# Patient Record
Sex: Male | Born: 1940 | Race: White | Hispanic: No | Marital: Married | Smoking: Never smoker
Health system: Southern US, Community
[De-identification: ages and names within clinical notes are randomized; demographics above are authoritative.]

## PROBLEM LIST (undated history)

## (undated) DIAGNOSIS — K5730 Diverticulosis of large intestine without perforation or abscess without bleeding: Secondary | ICD-10-CM

## (undated) DIAGNOSIS — K219 Gastro-esophageal reflux disease without esophagitis: Secondary | ICD-10-CM

## (undated) DIAGNOSIS — N200 Calculus of kidney: Secondary | ICD-10-CM

## (undated) DIAGNOSIS — R195 Other fecal abnormalities: Secondary | ICD-10-CM

## (undated) DIAGNOSIS — E785 Hyperlipidemia, unspecified: Secondary | ICD-10-CM

## (undated) HISTORY — DX: Hyperlipidemia, unspecified: E78.5

## (undated) HISTORY — DX: Calculus of kidney: N20.0

## (undated) HISTORY — DX: Other fecal abnormalities: R19.5

## (undated) HISTORY — DX: Diverticulosis of large intestine without perforation or abscess without bleeding: K57.30

## (undated) HISTORY — PX: APPENDECTOMY: SHX54

---

## 1962-05-25 HISTORY — PX: APPENDECTOMY: SHX54

## 1982-05-25 HISTORY — PX: HEMORRHOID SURGERY: SHX153

## 1998-10-16 ENCOUNTER — Emergency Department (HOSPITAL_COMMUNITY): Admission: EM | Admit: 1998-10-16 | Discharge: 1998-10-17 | Payer: Self-pay | Admitting: Emergency Medicine

## 1998-10-18 ENCOUNTER — Ambulatory Visit (HOSPITAL_COMMUNITY): Admission: RE | Admit: 1998-10-18 | Discharge: 1998-10-18 | Payer: Self-pay | Admitting: Emergency Medicine

## 1998-10-18 ENCOUNTER — Encounter: Payer: Self-pay | Admitting: Emergency Medicine

## 2000-03-24 ENCOUNTER — Encounter: Payer: Self-pay | Admitting: *Deleted

## 2000-03-24 ENCOUNTER — Encounter: Admission: RE | Admit: 2000-03-24 | Discharge: 2000-03-24 | Payer: Self-pay | Admitting: *Deleted

## 2001-08-23 ENCOUNTER — Ambulatory Visit (HOSPITAL_COMMUNITY): Admission: RE | Admit: 2001-08-23 | Discharge: 2001-08-23 | Payer: Self-pay | Admitting: Gastroenterology

## 2001-08-23 ENCOUNTER — Encounter (INDEPENDENT_AMBULATORY_CARE_PROVIDER_SITE_OTHER): Payer: Self-pay | Admitting: *Deleted

## 2004-03-26 ENCOUNTER — Ambulatory Visit (HOSPITAL_COMMUNITY): Admission: RE | Admit: 2004-03-26 | Discharge: 2004-03-26 | Payer: Self-pay | Admitting: *Deleted

## 2007-06-28 ENCOUNTER — Emergency Department (HOSPITAL_COMMUNITY): Admission: EM | Admit: 2007-06-28 | Discharge: 2007-06-28 | Payer: Self-pay | Admitting: Emergency Medicine

## 2008-12-04 ENCOUNTER — Encounter (INDEPENDENT_AMBULATORY_CARE_PROVIDER_SITE_OTHER): Payer: Self-pay | Admitting: *Deleted

## 2008-12-04 ENCOUNTER — Encounter: Admission: RE | Admit: 2008-12-04 | Discharge: 2008-12-04 | Payer: Self-pay | Admitting: Internal Medicine

## 2009-02-04 ENCOUNTER — Ambulatory Visit: Payer: Self-pay | Admitting: Gastroenterology

## 2009-02-04 DIAGNOSIS — K5730 Diverticulosis of large intestine without perforation or abscess without bleeding: Secondary | ICD-10-CM | POA: Insufficient documentation

## 2009-02-04 DIAGNOSIS — R195 Other fecal abnormalities: Secondary | ICD-10-CM

## 2009-02-14 ENCOUNTER — Ambulatory Visit: Payer: Self-pay | Admitting: Gastroenterology

## 2009-02-25 ENCOUNTER — Ambulatory Visit: Payer: Self-pay | Admitting: Gastroenterology

## 2009-02-25 LAB — CONVERTED CEMR LAB
OCCULT 1: NEGATIVE
OCCULT 2: NEGATIVE
OCCULT 5: NEGATIVE

## 2009-02-27 ENCOUNTER — Encounter: Payer: Self-pay | Admitting: Gastroenterology

## 2009-04-10 ENCOUNTER — Encounter: Admission: RE | Admit: 2009-04-10 | Discharge: 2009-04-10 | Payer: Self-pay | Admitting: Emergency Medicine

## 2010-10-10 NOTE — Procedures (Signed)
[facility]  Patient:    Evan Lopez, Evan Lopez Visit Number: [id number] MRN: [id number]          Service Type: Attending:  Everardo All. Madilyn Fireman, M.D. Dictated by:   Everardo All Madilyn Fireman, M.D. Proc. Date: 08/23/01   CC:         Caryn Bee L. Little, M.D.   Procedure Report  PROCEDURE:  Colonoscopy.  INDICATION FOR PROCEDURE:  Screening colonoscopy in a 70 year old patient.  DESCRIPTION OF PROCEDURE:  The patient was placed in the left lateral decubitus position and placed on the pulse monitor with continuous low-flow oxygen delivered by nasal cannula.  He was sedated with 85 mg of IV Demerol and 7.5 mg of IV Versed.  The Olympus video colonoscope was inserted into the rectum and advanced to the cecum, confirmed by transillumination at McBurneys point and visualization of the ileocecal valve and appendiceal orifice.  The prep was excellent.  The cecum appeared normal.  In the ascending colon, there were seen 2-3 diverticula and no other abnormalities.  The transverse, descending and sigmoid colon appeared normal with no further diverticula, polyps, masses, abnormalities.  The rectum likewise appeared normal, and retroflex view of the anus revealed no obvious internal hemorrhoids.  The colonoscope was then withdrawn, and the patient returned to the recovery room in stable condition.  He tolerated the procedure well, and there were no immediate complications.  IMPRESSION: 1. Scattered right-sided diverticula. 2. Otherwise normal colonoscopy.  PLAN:  Flexible sigmoidoscopy in five years for future screening. Dictated by:   Everardo All Madilyn Fireman, M.D. Attending:  Everardo All. Madilyn Fireman, M.D. DD:  08/23/01 TD:  08/24/01 Job: [id number]

## 2010-12-08 ENCOUNTER — Encounter (HOSPITAL_COMMUNITY): Payer: Self-pay

## 2010-12-08 ENCOUNTER — Emergency Department (HOSPITAL_COMMUNITY): Payer: Managed Care, Other (non HMO)

## 2010-12-08 ENCOUNTER — Emergency Department (HOSPITAL_COMMUNITY)
Admission: EM | Admit: 2010-12-08 | Discharge: 2010-12-08 | Disposition: A | Discharge status: Home / Self Care | Payer: Managed Care, Other (non HMO) | Attending: Emergency Medicine | Admitting: Emergency Medicine

## 2010-12-08 DIAGNOSIS — R61 Generalized hyperhidrosis: Secondary | ICD-10-CM | POA: Insufficient documentation

## 2010-12-08 DIAGNOSIS — K219 Gastro-esophageal reflux disease without esophagitis: Secondary | ICD-10-CM | POA: Insufficient documentation

## 2010-12-08 DIAGNOSIS — R109 Unspecified abdominal pain: Secondary | ICD-10-CM | POA: Insufficient documentation

## 2010-12-08 DIAGNOSIS — R197 Diarrhea, unspecified: Secondary | ICD-10-CM | POA: Insufficient documentation

## 2010-12-08 DIAGNOSIS — K5732 Diverticulitis of large intestine without perforation or abscess without bleeding: Secondary | ICD-10-CM | POA: Insufficient documentation

## 2010-12-08 HISTORY — DX: Gastro-esophageal reflux disease without esophagitis: K21.9

## 2010-12-08 LAB — COMPREHENSIVE METABOLIC PANEL
ALT: 13 U/L (ref 0–53)
BUN: 11 mg/dL (ref 6–23)
CO2: 28 mEq/L (ref 19–32)
Calcium: 9.1 mg/dL (ref 8.4–10.5)
GFR calc Af Amer: >60 mL/min (ref >60)
GFR calc non Af Amer: >60 mL/min (ref >60)
Glucose, Bld: 98 mg/dL (ref 70–99)
Sodium: 141 mEq/L (ref 135–145)
Total Protein: 7 g/dL (ref 6.0–8.3)

## 2010-12-08 LAB — CBC
HCT: 38.9 % — ABNORMAL LOW (ref 39.0–52.0)
Hemoglobin: 14 g/dL (ref 13.0–17.0)
MCH: 30 pg (ref 26.0–34.0)
MCHC: 36 g/dL (ref 30.0–36.0)
MCV: 83.3 fL (ref 78.0–100.0)
RBC: 4.67 MIL/uL (ref 4.22–5.81)

## 2010-12-08 LAB — DIFFERENTIAL
Basophils Relative: 0 % (ref 0–1)
Lymphocytes Relative: 11 % — ABNORMAL LOW (ref 12–46)
Lymphs Abs: 1.2 10*3/uL (ref 0.7–4.0)
Monocytes Absolute: 0.6 10*3/uL (ref 0.1–1.0)
Monocytes Relative: 6 % (ref 3–12)
Neutro Abs: 8.4 10*3/uL — ABNORMAL HIGH (ref 1.7–7.7)
Neutrophils Relative %: 82 % — ABNORMAL HIGH (ref 43–77)

## 2010-12-08 LAB — URINALYSIS, ROUTINE W REFLEX MICROSCOPIC
Bilirubin Urine: NEGATIVE
Glucose, UA: NEGATIVE mg/dL
Hgb urine dipstick: NEGATIVE
Specific Gravity, Urine: 1.02 (ref 1.005–1.030)
Urobilinogen, UA: 1 mg/dL (ref 0.0–1.0)

## 2010-12-08 LAB — LIPASE, BLOOD: Lipase: 18 U/L (ref 11–59)

## 2010-12-08 MED ORDER — IOHEXOL 300 MG/ML  SOLN
100.0000 mL | Freq: Once | INTRAMUSCULAR | Status: AC | PRN
  Administered 2010-12-08: 100 mL via INTRAVENOUS

## 2011-02-13 LAB — HEPATIC FUNCTION PANEL
ALT: 20
AST: 21
Albumin: 3.7
Alkaline Phosphatase: 82
Bilirubin, Direct: 0.1
Indirect Bilirubin: 0.8
Total Bilirubin: 0.9
Total Protein: 6.5

## 2011-02-13 LAB — DIFFERENTIAL
Basophils Absolute: 0.1
Basophils Relative: 1
Lymphocytes Relative: 32
Monocytes Relative: 8
Neutro Abs: 3.1
Neutrophils Relative %: 56

## 2011-02-13 LAB — POCT CARDIAC MARKERS
CKMB, poc: 2.2
Myoglobin, poc: 84.6
Operator id: 1415
Troponin i, poc: <0.05

## 2011-02-13 LAB — CBC
Hemoglobin: 14.1
MCHC: 35.1
RBC: 4.74
RDW: 13.2

## 2011-02-13 LAB — BASIC METABOLIC PANEL WITH GFR
BUN: 8
CO2: 27
Calcium: 9.2
Creatinine, Ser: 0.91
GFR calc Af Amer: >60
Glucose, Bld: 95

## 2011-02-13 LAB — LIPASE, BLOOD: Lipase: 19

## 2011-02-13 LAB — BASIC METABOLIC PANEL
Chloride: 108
GFR calc non Af Amer: >60
Potassium: 3.8
Sodium: 141

## 2011-06-24 ENCOUNTER — Ambulatory Visit (INDEPENDENT_AMBULATORY_CARE_PROVIDER_SITE_OTHER): Payer: Medicare Other | Admitting: Family Medicine

## 2011-06-24 ENCOUNTER — Ambulatory Visit: Payer: Medicare Other

## 2011-06-24 VITALS — BP 138/82 | HR 68 | Temp 98.0°F | Resp 18 | Ht 66.0 in | Wt 155.0 lb

## 2011-06-24 DIAGNOSIS — E785 Hyperlipidemia, unspecified: Secondary | ICD-10-CM

## 2011-06-24 DIAGNOSIS — K219 Gastro-esophageal reflux disease without esophagitis: Secondary | ICD-10-CM

## 2011-06-24 DIAGNOSIS — Z23 Encounter for immunization: Secondary | ICD-10-CM

## 2011-06-24 DIAGNOSIS — N200 Calculus of kidney: Secondary | ICD-10-CM | POA: Insufficient documentation

## 2011-06-24 DIAGNOSIS — M25529 Pain in unspecified elbow: Secondary | ICD-10-CM

## 2011-06-24 MED ORDER — METHYLPREDNISOLONE ACETATE 80 MG/ML IJ SUSP
80.0000 mg | Freq: Once | INTRAMUSCULAR | Status: AC
  Administered 2011-06-24: 80 mg via INTRA_ARTICULAR

## 2011-06-24 MED ORDER — INFLUENZA VIRUS VACC SPLIT PF IM SUSP: 0.5000 mL | INTRAMUSCULAR | Status: DC

## 2011-06-24 NOTE — Patient Instructions (Signed)
Tennis Elbow Your caregiver has diagnosed you with a condition often referred to as "tennis elbow." This results from small tears or soreness (inflammation) at the start (origin) of the extensor muscles of the forearm. Although the condition is often called tennis or golfer's elbow, it is caused by any repetitive action performed by your elbow. HOME CARE INSTRUCTIONS  If the condition has been short lived, rest may be the only treatment required. Using your opposite hand or arm to perform the task may help. Even changing your grip may help rest the extremity. These may even prevent the condition from recurring.   Longer standing problems, however, will often be relieved faster by:   Using anti-inflammatory agents.   Applying ice packs for 30 minutes at the end of the working day, at bed time, or when activities are finished.   Your caregiver may also have you wear a splint or sling. This will allow the inflamed tendon to heal.  At times, steroid injections aided with a local anesthetic will be required along with splinting for 1 to 2 weeks. Two to three steroid injections will often solve the problem. In some long standing cases, the inflamed tendon does not respond to conservative (non-surgical) therapy. Then surgery may be required to repair it. MAKE SURE YOU:   Understand these instructions.   Will watch your condition.   Will get help right away if you are not doing well or get worse.  Document Released: 05/11/2005 Document Revised: 01/21/2011 Document Reviewed: 12/28/2007 [employer] Patient Information 2012 [employer]. 

## 2011-06-24 NOTE — Progress Notes (Signed)
Patient ID: Evan Lopez, male   DOB: [birth year], 71 y.o.   MRN: [id number] This is a 71 yo man with a 3-4 week history of right elbow pain, made worse by clenching fist.  Awoke this morning and it was worse than ever.  Worse pain with supination.  NKI.  History of tennis elbow.  No new activities.  Patient able to move elbow with FROM.  No sign of skin or bony abnormality.  X-ray:  Bone cyst lateral epicondyle  Injection:  1 cc depomedrol and 1 cc sensorcaine injected at lateral epicondyle tender point after sterile prep.  No complications.  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  Plan:  Patient to call if pain not completely relieved in 48 hours

## 2011-11-25 ENCOUNTER — Ambulatory Visit (INDEPENDENT_AMBULATORY_CARE_PROVIDER_SITE_OTHER): Payer: Medicare Other | Admitting: Family Medicine

## 2011-11-25 VITALS — BP 122/78 | HR 75 | Temp 98.2°F | Resp 14 | Ht 66.0 in | Wt 157.2 lb

## 2011-11-25 DIAGNOSIS — M7050 Other bursitis of knee, unspecified knee: Secondary | ICD-10-CM

## 2011-11-25 DIAGNOSIS — IMO0002 Reserved for concepts with insufficient information to code with codable children: Secondary | ICD-10-CM

## 2011-11-25 DIAGNOSIS — M7710 Lateral epicondylitis, unspecified elbow: Secondary | ICD-10-CM

## 2011-11-25 MED ORDER — DICLOFENAC SODIUM 75 MG PO TBEC: 75.0000 mg | DELAYED_RELEASE_TABLET | Freq: Two times a day (BID) | ORAL | Status: DC

## 2011-11-25 NOTE — Progress Notes (Signed)
Subjective: 71 year old man who has a history of being here in January with right elbow pain. He received a shot of cortisone into that elbow and it did much better for a couple of months. It recurred in approximately March and he has been living with the discomfort since then. More recently has been having pain in the medial aspect of his right knee also. He does some yard work, but no major repetitive activity. He is retired.  Objective: Right elbow he is tender in the lateral epicondyle. No obvious redness or irritation. The right knee also has tenderness medially. It feels like the tenderness is just posterior to the pes anserine bursitis region. Is no specific injury of this. It hurts him worse if he is on his feet a lot. No effusion is detected.  Assessment: Right epicondylitis, medial elbow Right  pes anserine bursitis  History of GERD  Plan: Diclofenac 75 mg twice a day for 2 weeks. If he is not doing much better at the end of that time we will have to consider reinjecting in/or referring to orthopedist

## 2011-11-25 NOTE — Patient Instructions (Addendum)
Take the diclofenac twice daily with food for the inflammation in the elbow and the knee.  Apply ice to these areas 3 or 4 times daily  If the medicine upset her stomach quit taking it and come back in sooner. Continue your omeprazole.     Lateral Epicondylitis (Tennis Elbow) with Rehab Lateral epicondylitis involves inflammation and pain around the outer portion of the elbow. The pain is caused by inflammation of the tendons in the forearm that bring back (extend) the wrist. Lateral epicondylittis is also called tennis elbow, because it is very common in tennis players. However, it may occur in any individual who extends the wrist repetitively. If lateral epicondylitis is left untreated, it may become a chronic problem. SYMPTOMS   Pain, tenderness, and inflammation on the outer (lateral) side of the elbow.   Pain or weakness with gripping activities.   Pain that increases with wrist twisting motions (playing tennis, using a screwdriver, opening a door or a jar).   Pain with lifting objects, including a coffee cup.  CAUSES  Lateral epicondylitis is caused by inflammation of the tendons that extend the wrist. Causes of injury may include:  Repetitive stress and strain on the muscles and tendons that extend the wrist.   Sudden change in activity level or intensity.   Incorrect grip in racquet sports.   Incorrect grip size of racquet (often too large).   Incorrect hitting position or technique (usually backhand, leading with the elbow).   Using a racket that is too heavy.  RISK INCREASES WITH:  Sports or occupations that require repetitive and/or strenuous forearm and wrist movements (tennis, squash, racquetball, carpentry).   Poor wrist and forearm strength and flexibility.   Failure to warm up properly before activity.   Resuming activity before healing, rehabilitation, and conditioning are complete.  PREVENTION   Warm up and stretch properly before activity.   Maintain  physical fitness:   Strength, flexibility, and endurance.   Cardiovascular fitness.   Wear and use properly fitted equipment.   Learn and use proper technique and have a coach correct improper technique.   Wear a tennis elbow (counterforce) brace.  PROGNOSIS  The course of this condition depends on the degree of the injury. If treated properly, acute cases (symptoms lasting less than 4 weeks) are often resolved in 2 to 6 weeks. Chronic (longer lasting cases) often resolve in 3 to 6 months, but may require physical therapy. RELATED COMPLICATIONS   Frequently recurring symptoms, resulting in a chronic problem. Properly treating the problem the first time decreases frequency of recurrence.   Chronic inflammation, scarring tendon degeneration, and partial tendon tear, requiring surgery.   Delayed healing or resolution of symptoms.  TREATMENT  Treatment first involves the use of ice and medicine, to reduce pain and inflammation. Strengthening and stretching exercises may help reduce discomfort, if performed regularly. These exercises may be performed at home, if the condition is an acute injury. Chronic cases may require a referral to a physical therapist for evaluation and treatment. Your caregiver may advise a corticosteroid injection, to help reduce inflammation. Rarely, surgery is needed. MEDICATION  If pain medicine is needed, nonsteroidal anti-inflammatory medicines (aspirin and ibuprofen), or other minor pain relievers (acetaminophen), are often advised.   Do not take pain medicine for 7 days before surgery.   Prescription pain relievers may be given, if your caregiver thinks they are needed. Use only as directed and only as much as you need.   Corticosteroid injections may be recommended.  These injections should be reserved only for the most severe cases, because they can only be given a certain number of times.  HEAT AND COLD  Cold treatment (icing) should be applied for 10 to 15  minutes every 2 to 3 hours for inflammation and pain, and immediately after activity that aggravates your symptoms. Use ice packs or an ice massage.   Heat treatment may be used before performing stretching and strengthening activities prescribed by your caregiver, physical therapist, or athletic trainer. Use a heat pack or a warm water soak.  SEEK MEDICAL CARE IF: Symptoms get worse or do not improve in 2 weeks, despite treatment. EXERCISES  RANGE OF MOTION (ROM) AND STRETCHING EXERCISES - Epicondylitis, Lateral (Tennis Elbow) These exercises may help you when beginning to rehabilitate your injury. Your symptoms may go away with or without further involvement from your physician, physical therapist or athletic trainer. While completing these exercises, remember:   Restoring tissue flexibility helps normal motion to return to the joints. This allows healthier, less painful movement and activity.   An effective stretch should be held for at least 30 seconds.   A stretch should never be painful. You should only feel a gentle lengthening or release in the stretched tissue.  RANGE OF MOTION - Wrist Flexion, Active-Assisted  Extend your right / left elbow with your fingers pointing down.*   Gently pull the back of your hand towards you, until you feel a gentle stretch on the top of your forearm.   Hold this position for __________ seconds.  Repeat __________ times. Complete this exercise __________ times per day.  *If directed by your physician, physical therapist or athletic trainer, complete this stretch with your elbow bent, rather than extended. RANGE OF MOTION - Wrist Extension, Active-Assisted  Extend your right / left elbow and turn your palm upwards.*   Gently pull your palm and fingertips back, so your wrist extends and your fingers point more toward the ground.   You should feel a gentle stretch on the inside of your forearm.   Hold this position for __________ seconds.  Repeat  __________ times. Complete this exercise __________ times per day. *If directed by your physician, physical therapist or athletic trainer, complete this stretch with your elbow bent, rather than extended. STRETCH - Wrist Flexion  Place the back of your right / left hand on a tabletop, leaving your elbow slightly bent. Your fingers should point away from your body.   Gently press the back of your hand down onto the table by straightening your elbow. You should feel a stretch on the top of your forearm.   Hold this position for __________ seconds.  Repeat __________ times. Complete this stretch __________ times per day.  STRETCH - Wrist Extension   Place your right / left fingertips on a tabletop, leaving your elbow slightly bent. Your fingers should point backwards.   Gently press your fingers and palm down onto the table by straightening your elbow. You should feel a stretch on the inside of your forearm.   Hold this position for __________ seconds.  Repeat __________ times. Complete this stretch __________ times per day.  STRENGTHENING EXERCISES - Epicondylitis, Lateral (Tennis Elbow) These exercises may help you when beginning to rehabilitate your injury. They may resolve your symptoms with or without further involvement from your physician, physical therapist or athletic trainer. While completing these exercises, remember:   Muscles can gain both the endurance and the strength needed for everyday activities through controlled exercises.  Complete these exercises as instructed by your physician, physical therapist or athletic trainer. Increase the resistance and repetitions only as guided.   You may experience muscle soreness or fatigue, but the pain or discomfort you are trying to eliminate should never worsen during these exercises. If this pain does get worse, stop and make sure you are following the directions exactly. If the pain is still present after adjustments, discontinue the  exercise until you can discuss the trouble with your caregiver.  STRENGTH - Wrist Flexors  Sit with your right / left forearm palm-up and fully supported on a table or countertop. Your elbow should be resting below the height of your shoulder. Allow your wrist to extend over the edge of the surface.   Loosely holding a __________ weight, or a piece of rubber exercise band or tubing, slowly curl your hand up toward your forearm.   Hold this position for __________ seconds. Slowly lower the wrist back to the starting position in a controlled manner.  Repeat __________ times. Complete this exercise __________ times per day.  STRENGTH - Wrist Extensors  Sit with your right / left forearm palm-down and fully supported on a table or countertop. Your elbow should be resting below the height of your shoulder. Allow your wrist to extend over the edge of the surface.   Loosely holding a __________ weight, or a piece of rubber exercise band or tubing, slowly curl your hand up toward your forearm.   Hold this position for __________ seconds. Slowly lower the wrist back to the starting position in a controlled manner.  Repeat __________ times. Complete this exercise __________ times per day.  STRENGTH - Ulnar Deviators  Stand with a ____________________ weight in your right / left hand, or sit while holding a rubber exercise band or tubing, with your healthy arm supported on a table or countertop.   Move your wrist, so that your pinkie travels toward your forearm and your thumb moves away from your forearm.   Hold this position for __________ seconds and then slowly lower the wrist back to the starting position.  Repeat __________ times. Complete this exercise __________ times per day STRENGTH - Radial Deviators  Stand with a ____________________ weight in your right / left hand, or sit while holding a rubber exercise band or tubing, with your injured arm supported on a table or countertop.   Raise  your hand upward in front of you or pull up on the rubber tubing.   Hold this position for __________ seconds and then slowly lower the wrist back to the starting position.  Repeat __________ times. Complete this exercise __________ times per day. STRENGTH - Forearm Supinators   Sit with your right / left forearm supported on a table, keeping your elbow below shoulder height. Rest your hand over the edge, palm down.   Gently grip a hammer or a soup ladle.   Without moving your elbow, slowly turn your palm and hand upward to a "thumbs-up" position.   Hold this position for __________ seconds. Slowly return to the starting position.  Repeat __________ times. Complete this exercise __________ times per day.  STRENGTH - Forearm Pronators   Sit with your right / left forearm supported on a table, keeping your elbow below shoulder height. Rest your hand over the edge, palm up.   Gently grip a hammer or a soup ladle.   Without moving your elbow, slowly turn your palm and hand upward to a "thumbs-up" position.   Hold  this position for __________ seconds. Slowly return to the starting position.  Repeat __________ times. Complete this exercise __________ times per day.  STRENGTH - Grip  Grasp a tennis ball, a dense sponge, or a large, rolled sock in your hand.   Squeeze as hard as you can, without increasing any pain.   Hold this position for __________ seconds. Release your grip slowly.  Repeat __________ times. Complete this exercise __________ times per day.  STRENGTH - Elbow Extensors, Isometric  Stand or sit upright, on a firm surface. Place your right / left arm so that your palm faces your stomach, and it is at the height of your waist.   Place your opposite hand on the underside of your forearm. Gently push up as your right / left arm resists. Push as hard as you can with both arms, without causing any pain or movement at your right / left elbow. Hold this stationary position for  __________ seconds.  Gradually release the tension in both arms. Allow your muscles to relax completely before repeating. Document Released: 05/11/2005 Document Revised: 04/30/2011 Document Reviewed: 08/23/2008 [facility] Patient Information 2012 [place], [place].

## 2011-12-29 ENCOUNTER — Ambulatory Visit (INDEPENDENT_AMBULATORY_CARE_PROVIDER_SITE_OTHER): Payer: Medicare Other | Admitting: Internal Medicine

## 2011-12-29 ENCOUNTER — Ambulatory Visit: Payer: Medicare Other

## 2011-12-29 VITALS — BP 112/62 | HR 66 | Temp 97.6°F | Resp 16 | Ht 66.0 in | Wt 152.2 lb

## 2011-12-29 DIAGNOSIS — L0231 Cutaneous abscess of buttock: Secondary | ICD-10-CM

## 2011-12-29 DIAGNOSIS — E785 Hyperlipidemia, unspecified: Secondary | ICD-10-CM

## 2011-12-29 DIAGNOSIS — L723 Sebaceous cyst: Secondary | ICD-10-CM

## 2011-12-29 DIAGNOSIS — L03317 Cellulitis of buttock: Secondary | ICD-10-CM

## 2011-12-29 DIAGNOSIS — M25521 Pain in right elbow: Secondary | ICD-10-CM

## 2011-12-29 DIAGNOSIS — M25529 Pain in unspecified elbow: Secondary | ICD-10-CM

## 2011-12-29 LAB — LIPID PANEL
Cholesterol: 275 mg/dL — ABNORMAL HIGH (ref 0–200)
Triglycerides: 222 mg/dL — ABNORMAL HIGH (ref <150)

## 2011-12-29 MED ORDER — IBUPROFEN 600 MG PO TABS: 600.0000 mg | ORAL_TABLET | Freq: Three times a day (TID) | ORAL | Status: AC | PRN

## 2011-12-29 MED ORDER — DOXYCYCLINE HYCLATE 100 MG PO TABS: 100.0000 mg | ORAL_TABLET | Freq: Two times a day (BID) | ORAL | Status: AC

## 2011-12-29 NOTE — Progress Notes (Signed)
  Subjective:    Patient ID: Evan Lopez, male    DOB: [birth year], 71 y.o.   MRN: [id number]  HPI    Review of Systems     Objective:   Physical Exam  Procedure: Consent obtained - local anesthesia with 2% lido with epi obtained - betadine prep and #11 blade used to make a horizontal 1 cm incision - purulent material expressed - capsule identified but unable to remove completely - 1/4 in packing placed - drsg placed - wound care discussed with patient.      Assessment & Plan:

## 2011-12-29 NOTE — Patient Instructions (Addendum)
Change dressing daily. Take antibiotics. Ok to shower but do not soak the area in a bathtub or pool.  Warm compresses to the area. Recheck on Friday, 01/01/2012.   Abscess An abscess (boil or furuncle) is an infected area that contains a collection of pus.  SYMPTOMS Signs and symptoms of an abscess include pain, tenderness, redness, or hardness. You may feel a moveable soft area under your skin. An abscess can occur anywhere in the body.  TREATMENT  A surgical cut (incision) may be made over your abscess to drain the pus. Gauze may be packed into the space or a drain may be looped through the abscess cavity (pocket). This provides a drain that will allow the cavity to heal from the inside outwards. The abscess may be painful for a few days, but should feel much better if it was drained.  Your abscess, if seen early, may not have localized and may not have been drained. If not, another appointment may be required if it does not get better on its own or with medications. HOME CARE INSTRUCTIONS   Only take over-the-counter or prescription medicines for pain, discomfort, or fever as directed by your caregiver.   Take your antibiotics as directed if they were prescribed. Finish them even if you start to feel better.   Keep the skin and clothes clean around your abscess.   If the abscess was drained, you will need to use gauze dressing to collect any draining pus. Dressings will typically need to be changed 3 or more times a day.   The infection may spread by skin contact with others. Avoid skin contact as much as possible.   Practice good hygiene. This includes regular hand washing, cover any draining skin lesions, and do not share personal care items.   If you participate in sports, do not share athletic equipment, towels, whirlpools, or personal care items. Shower after every practice or tournament.   If a draining area cannot be adequately covered:   Do not participate in sports.    Children should not participate in day care until the wound has healed or drainage stops.   If your caregiver has given you a follow-up appointment, it is very important to keep that appointment. Not keeping the appointment could result in a much worse infection, chronic or permanent injury, pain, and disability. If there is any problem keeping the appointment, you must call back to this facility for assistance.  SEEK MEDICAL CARE IF:   You develop increased pain, swelling, redness, drainage, or bleeding in the wound site.   You develop signs of generalized infection including muscle aches, chills, fever, or a general ill feeling.   You have an oral temperature above 102 F (38.9 C).  MAKE SURE YOU:   Understand these instructions.   Will watch your condition.   Will get help right away if you are not doing well or get worse.  Document Released: 02/18/2005 Document Revised: 04/30/2011 Document Reviewed: 12/13/2007 [facility] Patient Information 2012 [place], [place].

## 2011-12-29 NOTE — Progress Notes (Signed)
  Subjective:    Patient ID: Evan Lopez, male    DOB: 1941/01/15, 71 y.o.   MRN: [id number]  HPI Has painfull lesion near buttock. Continues to have right elbow pain, chronic tendonitis like.   Review of Systems     Objective:   Physical Exam Tender lateral right elbow, does not have full extension compared to left. Buttock has infected sebaceous cyst, needs I/D Elbow full rom and nms intact [facility] reading (PRIMARY) by  Dr.Guest anterior fat pad, no fx seen.      Assessment & Plan:  Aggressive elbow rehab Abscess care Doxycycline

## 2012-01-01 ENCOUNTER — Ambulatory Visit (INDEPENDENT_AMBULATORY_CARE_PROVIDER_SITE_OTHER): Payer: Medicare Other | Admitting: Physician Assistant

## 2012-01-01 VITALS — BP 113/66 | HR 75 | Temp 98.4°F | Resp 16

## 2012-01-01 DIAGNOSIS — M7918 Myalgia, other site: Secondary | ICD-10-CM

## 2012-01-01 DIAGNOSIS — L089 Local infection of the skin and subcutaneous tissue, unspecified: Secondary | ICD-10-CM

## 2012-01-01 DIAGNOSIS — J069 Acute upper respiratory infection, unspecified: Secondary | ICD-10-CM

## 2012-01-01 DIAGNOSIS — J342 Deviated nasal septum: Secondary | ICD-10-CM

## 2012-01-01 NOTE — Progress Notes (Signed)
  Subjective:    Patient ID: Evan Lopez, male    DOB: [birth year], 71 y.o.   MRN: [id number]  HPI  Pt presents to clinic for wound recheck and drsg change.  He has been tolerating the abx ok.  Changing drsg daily with minimal purulence on drsg.  The packing feel out yesterday with his drsg change.  Review of Systems  Constitutional: Negative for fever and chills.       Objective:   Physical Exam  Vitals reviewed. Constitutional: He is oriented to person, place, and time. He appears well-developed and well-nourished.  HENT:  Head: Normocephalic and atraumatic.  Right Ear: External ear normal.  Left Ear: External ear normal.  Pulmonary/Chest: Effort normal.  Neurological: He is alert and oriented to person, place, and time.  Skin: Skin is warm and dry.       Drsg removed.  Decreased erythema and induration.  No purulence expressed on wound.  Wound about 1cm deep.  Repacked with 1/4in plain packing about 0.5cm.  Drsg placed.  Psychiatric: He has a normal mood and affect. His behavior is normal. Judgment and thought content normal.          Assessment & Plan:   1. Infected sebaceous cyst   2. Pain in left buttock

## 2012-01-01 NOTE — Patient Instructions (Signed)
Change drsg daily.  Continue antibiotics.  If packing falls out that is ok - if any concerns about wound please RTC in 3 days - if packing does not fall out recheck in 3 days for drsg change/wound recheck. Pt agrees and understands.

## 2012-01-02 LAB — WOUND CULTURE
Gram Stain: NONE SEEN
Gram Stain: NONE SEEN

## 2012-03-24 ENCOUNTER — Ambulatory Visit (INDEPENDENT_AMBULATORY_CARE_PROVIDER_SITE_OTHER): Payer: Medicare Other | Admitting: *Deleted

## 2012-03-24 DIAGNOSIS — Z23 Encounter for immunization: Secondary | ICD-10-CM

## 2012-04-27 ENCOUNTER — Ambulatory Visit
Admission: RE | Admit: 2012-04-27 | Discharge: 2012-04-27 | Disposition: A | Discharge status: Home / Self Care | Payer: Medicare Other | Source: Ambulatory Visit | Attending: Family Medicine | Admitting: Family Medicine

## 2012-04-27 ENCOUNTER — Ambulatory Visit: Payer: Medicare Other

## 2012-04-27 ENCOUNTER — Ambulatory Visit (INDEPENDENT_AMBULATORY_CARE_PROVIDER_SITE_OTHER): Payer: Medicare Other | Admitting: Family Medicine

## 2012-04-27 VITALS — BP 126/75 | HR 70 | Temp 97.6°F | Resp 18 | Ht 66.0 in | Wt 153.0 lb

## 2012-04-27 DIAGNOSIS — R200 Anesthesia of skin: Secondary | ICD-10-CM

## 2012-04-27 DIAGNOSIS — R202 Paresthesia of skin: Secondary | ICD-10-CM

## 2012-04-27 DIAGNOSIS — R079 Chest pain, unspecified: Secondary | ICD-10-CM

## 2012-04-27 DIAGNOSIS — R209 Unspecified disturbances of skin sensation: Secondary | ICD-10-CM

## 2012-04-27 DIAGNOSIS — R55 Syncope and collapse: Secondary | ICD-10-CM

## 2012-04-27 DIAGNOSIS — E785 Hyperlipidemia, unspecified: Secondary | ICD-10-CM

## 2012-04-27 LAB — COMPREHENSIVE METABOLIC PANEL
Albumin: 4.5 g/dL (ref 3.5–5.2)
CO2: 28 mEq/L (ref 19–32)
Glucose, Bld: 82 mg/dL (ref 70–99)
Sodium: 141 mEq/L (ref 135–145)
Total Bilirubin: 0.8 mg/dL (ref 0.3–1.2)
Total Protein: 7 g/dL (ref 6.0–8.3)

## 2012-04-27 LAB — POCT CBC
Granulocyte percent: 66.4 % (ref 37–80)
HCT, POC: 46 % (ref 43.5–53.7)
Hemoglobin: 14.5 g/dL (ref 14.1–18.1)
Lymph, poc: 1.6 (ref 0.6–3.4)
MCH, POC: 28.4 pg (ref 27–31.2)
MCHC: 31.5 g/dL — AB (ref 31.8–35.4)
MCV: 90 fL (ref 80–97)
MID (cbc): 0.4 (ref 0–0.9)
MPV: 8.4 fL (ref 0–99.8)
POC Granulocyte: 4 (ref 2–6.9)
POC LYMPH PERCENT: 27.3 % (ref 10–50)
POC MID %: 6.3 % (ref 0–12)
Platelet Count, POC: 248 10*3/uL (ref 142–424)
RBC: 5.11 M/uL (ref 4.69–6.13)
RDW, POC: 13.5 %
WBC: 6 10*3/uL (ref 4.6–10.2)

## 2012-04-27 LAB — COMPREHENSIVE METABOLIC PANEL WITH GFR
ALT: 15 U/L (ref 0–53)
AST: 18 U/L (ref 0–37)
Alkaline Phosphatase: 80 U/L (ref 39–117)
BUN: 15 mg/dL (ref 6–23)
Calcium: 9.6 mg/dL (ref 8.4–10.5)
Chloride: 104 meq/L (ref 96–112)
Creat: 1.02 mg/dL (ref 0.50–1.35)
Potassium: 4.1 meq/L (ref 3.5–5.3)

## 2012-04-27 LAB — POCT GLYCOSYLATED HEMOGLOBIN (HGB A1C): Hemoglobin A1C: 5.2

## 2012-04-27 LAB — TSH: TSH: 1.766 u[IU]/mL (ref 0.350–4.500)

## 2012-04-27 MED ORDER — ROSUVASTATIN CALCIUM 40 MG PO TABS: 40.0000 mg | ORAL_TABLET | Freq: Every day | ORAL | Status: DC

## 2012-04-27 NOTE — Progress Notes (Signed)
[facility]:  Office Visit  Chief Complaint:  Chief Complaint  Patient presents with  . Dizziness    fainted while hunting; hurt left leg on November 15th  . Dizziness    last night with nausea  . left shoulder and knee pain and tingling sensation  . chest pain for one month    HPI: Evan Lopez is a 71 y.o. male who complains of fainting and chest pain:  On 04/08/2012 about 3 weeks ago,  Evan Lopez experienced an unwitnessed  fainting spell where he lost consciousness lasting an unknown amount of time while he  was deer hunting.  He was strapped up in a tree with a harness; he was heavily clothed for the cold weather; as the day progressed he got a little warmer and  started feeling nauseated and dizzy. He felt sick like this when he was going from a strapped sitting to a standing position to take off the tree harness because he did not want to get sick while up in the tree;  when he stood up he fainted and had an episode of LOC.  He denies having any HAs, vision changes, CP or diaphoresis prior to this, he just felt a little nauseated and warm, after the incident he denies  being confused or having weakness. However he did started having intermittent numbness and tingling in his left arm radiating down to his left leg and the  bottom of his left foot. He still has residual left foot numbness and tingling. Risk factors for CAD/CVA include hyperlipidemia. He denies Diabetes or HTN.  He has a h/o shoulder pain ? Rotator cuff issues and knee arthritis  Additionally he has been experiencing midepigastric chest pain with walking his dog for the last 1 month or more. Described as intermittent chest tightness, lasting 2-5 minutes, worse with heavy exertion but if he starts breathing deeply then it helps. He used to jog but cannot do that anymore, he denies any , PND. DOE, SOB. orthopnea, lower extremity swelling/pedal edema. Deneis palpitations or diaphoresis. He has a h/o well  controlled GERD on PPI BID. Marland Kitchen   Past Medical History  Diagnosis Date  . GERD (gastroesophageal reflux disease)   . Hyperlipidemia    Past Surgical History  Procedure Date  . Appendectomy    History   Social History  . Marital Status: Married    Spouse Name: N/A    Number of Children: N/A  . Years of Education: N/A   Social History Main Topics  . Smoking status: Never Smoker   . Smokeless tobacco: None  . Alcohol Use: No  . Drug Use: No  . Sexually Active: Yes   Other Topics Concern  . None   Social History Narrative  . None   Family History  Problem Relation Age of Onset  . Cancer Mother   . Alcohol abuse Father    No Known Allergies Prior to Admission medications   Medication Sig Start Date End Date Taking? Authorizing Provider  omeprazole (PRILOSEC) 20 MG capsule Take 20 mg by mouth 2 (two) times daily.   Yes Historical Provider, MD     ROS: The patient denies fevers, chills, night sweats, unintentional weight loss, palpitations, wheezing, dyspnea on exertion, vomiting, abdominal pain, dysuria, hematuria, melena   All other systems have been reviewed and were otherwise negative with the exception of those mentioned in the HPI and as above.    PHYSICAL EXAM: Filed Vitals:   04/27/12  1502  BP: 126/75  Pulse: 70  Temp:   Resp:    Filed Vitals:   04/27/12 1310  Height: 5\' 6"  (1.676 m)  Weight: 153 lb (69.4 kg)   Body mass index is 24.69 kg/(m^2).  General: Alert, no acute distress HEENT:  Normocephalic, atraumatic, oropharynx patent. EOMI, PERRLA, fundoscopic exam nl. Cardiovascular:  Regular rate and rhythm, no rubs murmurs or gallops.  No Carotid bruits, radial pulse intact. No pedal edema.  Respiratory: Clear to auscultation bilaterally.  No wheezes, rales, or rhonchi.  No cyanosis, no use of accessory musculature GI: No organomegaly, abdomen is soft and non-tender, positive bowel sounds.  No masses. Skin: No rashes. Neurologic: Facial musculature  symmetric.CN 2-12 grossly intact, nl heel to toe, nl Romberg, 4/5 slightly on left hand grip compared to 5/5 on right. No slurred speech.  Psychiatric: Patient is appropriate throughout our interaction. Lymphatic: No cervical lymphadenopathy Musculoskeletal: Gait intact.    LABS: Lab Results  Component Value Date   HGBA1C 5.2 04/27/2012    Lab Results  Component Value Date   WBC 6.0 04/27/2012   HGB 14.5 04/27/2012   HCT 46.0 04/27/2012   MCV 90.0 04/27/2012   PLT 177 12/08/2010     EKG/XRAY:   Primary read interpreted by Dr. Conley Rolls at [facility]. Chest xray -no pneumothorax, no infiltrates EKG NSR 68 bpm   ASSESSMENT/PLAN: Encounter Diagnoses  Name Primary?  . Chest pain Yes  . Syncope   . Numbness and tingling of left arm and leg   . Hyperlipidemia    71 y/o white male present with stroke like sxs and syncope x 1 that occurred 3 weeks ago now has minimal paresthesia on left side with ongoing  intermittent midepigastric CP with exertion for the last 3-4 weeks. He has a h/o hyperlipidemia. Denies CP currently. Denies HTN, DM, tobacco use. PMH includes GERD. Risks and benefits of going to ER now  vs getting worked up as outpatientwas explained to patient, he wants to get evaluated as an outpatient knowing that if sxs worsen then he will go immediately to ER.    Etiology of Syncope- cardiac in origin vs neuro vs vasovagal ( orthostatics normal)  Etiology of midepigastric CP- cardiac in origin vs GERD Rx Crestor 40 mg daily, f/u in 8 weeks for repeat fasting lipid panel Will get stat MRI Brain w/out contrast for rule out stroke Consider carotid doppler  Will refer to cardiology ( to be seen as soon as possible) Advise to take ASA 81 mg daily for now Go to ER prn for worsening sxs    Jaeley Wiker PHUONG, DO 04/27/2012 8:53 PM

## 2012-04-29 ENCOUNTER — Encounter: Payer: Self-pay | Admitting: Family Medicine

## 2012-05-12 ENCOUNTER — Encounter: Payer: Self-pay | Admitting: *Deleted

## 2012-05-12 ENCOUNTER — Encounter: Payer: Self-pay | Admitting: Cardiovascular Disease

## 2012-05-13 ENCOUNTER — Ambulatory Visit (INDEPENDENT_AMBULATORY_CARE_PROVIDER_SITE_OTHER): Payer: Medicare Other | Admitting: Cardiovascular Disease

## 2012-05-13 ENCOUNTER — Encounter: Payer: Self-pay | Admitting: Cardiovascular Disease

## 2012-05-13 VITALS — BP 138/83 | HR 72 | Ht 68.0 in | Wt 154.0 lb

## 2012-05-13 DIAGNOSIS — R0989 Other specified symptoms and signs involving the circulatory and respiratory systems: Secondary | ICD-10-CM

## 2012-05-13 DIAGNOSIS — E785 Hyperlipidemia, unspecified: Secondary | ICD-10-CM

## 2012-05-13 DIAGNOSIS — R55 Syncope and collapse: Secondary | ICD-10-CM

## 2012-05-13 DIAGNOSIS — R0609 Other forms of dyspnea: Secondary | ICD-10-CM

## 2012-05-13 DIAGNOSIS — R0600 Dyspnea, unspecified: Secondary | ICD-10-CM

## 2012-05-13 DIAGNOSIS — R079 Chest pain, unspecified: Secondary | ICD-10-CM

## 2012-05-13 NOTE — Assessment & Plan Note (Addendum)
Cholesterol is not  at goal.  Lab Results  Component Value Date   LDLCALC 185* 12/29/2011  Will need to start statin if evidence of CAD

## 2012-05-13 NOTE — Assessment & Plan Note (Signed)
This episode seems vagal.  Doubt arrhythmia with normal exam and ECG  F/U echo

## 2012-05-13 NOTE — Progress Notes (Signed)
Patient ID: Evan Lopez, male   DOB: 05/22/1941, 71 y.o.   MRN: [id number] 71 yo referred for chest pain and syncope  On 04/08/2012 about 3 weeks ago, Evan Lopez experienced an unwitnessed fainting spell where he lost consciousness lasting an unknown amount of time while he was deer hunting. He was strapped up in a tree with a harness; he was heavily clothed for the cold weather; as the day progressed he got a little warmer and started feeling nauseated and dizzy. He felt sick like this when he was going from a strapped sitting to a standing position to take off the tree harness because he did not want to get sick while up in the tree; when he stood up he fainted and had an episode of LOC. He denies having any HAs, vision changes, CP or diaphoresis prior to this, he just felt a little nauseated and warm, after the incident he denies being confused or having weakness. However he did started having intermittent numbness and tingling in his left arm radiating down to his left leg and the bottom of his left foot. He still has residual left foot numbness and tingling. Risk factors for CAD/CVA include hyperlipidemia. He denies Diabetes or HTN. He has a h/o shoulder pain ? Rotator cuff issues and knee arthritis   Additionally he has been experiencing midepigastric chest pain with walking his dog for the last 1 month or more. Described as intermittent chest tightness, lasting 2-5 minutes, worse with heavy exertion but if he starts breathing deeply then it helps. He used to jog but cannot do that anymore, he denies any , PND. DOE, SOB. orthopnea, lower extremity swelling/pedal edema. Deneis palpitations or diaphoresis. He has a h/o well controlled GERD on PPI BID. .   Since episode he does get exertional pressure in his chest and dyspnea No resting pain.  When he stops pain goes away quickly  ROS: Denies fever, malais, weight loss, blurry vision, decreased visual acuity, cough, sputum, SOB, hemoptysis, pleuritic  pain, palpitaitons, heartburn, abdominal pain, melena, lower extremity edema, claudication, or rash.  All other systems reviewed and negative  General: Affect appropriate Healthy:  appears stated age HEENT: normal Neck supple with no adenopathy JVP normal no bruits no thyromegaly Lungs clear with no wheezing and good diaphragmatic motion Heart:  S1/S2 no murmur, no rub, gallop or click PMI normal Abdomen: benighn, BS positve, no tenderness, no AAA no bruit.  No HSM or HJR Distal pulses intact with no bruits No edema Neuro non-focal Skin warm and dry No muscular weakness   Current Outpatient Prescriptions  Medication Sig Dispense Refill  . omeprazole (PRILOSEC) 20 MG capsule Take 20 mg by mouth 2 (two) times daily.      . rosuvastatin (CRESTOR) 40 MG tablet Take 1 tablet (40 mg total) by mouth daily.  30 tablet  2    Allergies  Review of patient's allergies indicates no known allergies.  Electrocardiogram:  SR rate 68 normal   Assessment and Plan   

## 2012-05-13 NOTE — Assessment & Plan Note (Signed)
Somewhat worrisom exertiona pressure and dyspnea.  Farily reproducable.  Unrelated to his syncope while hunting  F/U stress myovue

## 2012-05-13 NOTE — Patient Instructions (Signed)
Your physician recommends that you schedule a follow-up appointment in:  AS NEEDED  Your physician recommends that you continue on your current medications as directed. Please refer to the Current Medication list given to you today.  Your physician has requested that you have an echocardiogram. Echocardiography is a painless test that uses sound waves to create images of your heart. It provides your doctor with information about the size and shape of your heart and how well your heart's chambers and valves are working. This procedure takes approximately one hour. There are no restrictions for this procedure.   Your physician has requested that you have en exercise stress myoview. For further information please visit [email or web address]. Please follow instruction sheet, as given.  

## 2012-05-23 ENCOUNTER — Ambulatory Visit (HOSPITAL_COMMUNITY): Payer: Medicare Other | Attending: Cardiology | Admitting: Radiology

## 2012-05-23 DIAGNOSIS — R072 Precordial pain: Secondary | ICD-10-CM

## 2012-05-23 DIAGNOSIS — R0600 Dyspnea, unspecified: Secondary | ICD-10-CM

## 2012-05-23 DIAGNOSIS — I369 Nonrheumatic tricuspid valve disorder, unspecified: Secondary | ICD-10-CM | POA: Insufficient documentation

## 2012-05-23 DIAGNOSIS — R079 Chest pain, unspecified: Secondary | ICD-10-CM

## 2012-05-23 DIAGNOSIS — I059 Rheumatic mitral valve disease, unspecified: Secondary | ICD-10-CM | POA: Insufficient documentation

## 2012-05-23 NOTE — Progress Notes (Signed)
Echocardiogram performed.  

## 2012-05-30 ENCOUNTER — Telehealth: Payer: Self-pay

## 2012-05-30 ENCOUNTER — Other Ambulatory Visit: Payer: Self-pay

## 2012-05-30 ENCOUNTER — Ambulatory Visit (HOSPITAL_COMMUNITY): Payer: Medicare Other | Attending: Cardiology | Admitting: Radiology

## 2012-05-30 VITALS — Ht 68.0 in | Wt 153.0 lb

## 2012-05-30 DIAGNOSIS — R079 Chest pain, unspecified: Secondary | ICD-10-CM

## 2012-05-30 DIAGNOSIS — R55 Syncope and collapse: Secondary | ICD-10-CM | POA: Insufficient documentation

## 2012-05-30 DIAGNOSIS — R0609 Other forms of dyspnea: Secondary | ICD-10-CM | POA: Insufficient documentation

## 2012-05-30 DIAGNOSIS — R0789 Other chest pain: Secondary | ICD-10-CM | POA: Insufficient documentation

## 2012-05-30 DIAGNOSIS — R0602 Shortness of breath: Secondary | ICD-10-CM

## 2012-05-30 DIAGNOSIS — R0600 Dyspnea, unspecified: Secondary | ICD-10-CM

## 2012-05-30 DIAGNOSIS — Z01810 Encounter for preprocedural cardiovascular examination: Secondary | ICD-10-CM

## 2012-05-30 DIAGNOSIS — R0989 Other specified symptoms and signs involving the circulatory and respiratory systems: Secondary | ICD-10-CM | POA: Insufficient documentation

## 2012-05-30 MED ORDER — TECHNETIUM TC 99M SESTAMIBI GENERIC - CARDIOLITE
33.0000 | Freq: Once | INTRAVENOUS | Status: AC | PRN
  Administered 2012-05-30: 33 via INTRAVENOUS

## 2012-05-30 MED ORDER — TECHNETIUM TC 99M SESTAMIBI GENERIC - CARDIOLITE
11.0000 | Freq: Once | INTRAVENOUS | Status: AC | PRN
  Administered 2012-05-30: 11 via INTRAVENOUS

## 2012-05-30 NOTE — Telephone Encounter (Signed)
**Note De-Identified Chamya Hunton Obfuscation** Pt. Is advised that his cath is scheduled on 06-02-12 @ 9:30 am with Dr. Eden Emms. Pt. states that he will come by office for lab work on 06-01-12 and will receive his instruction sheet at that time. I went over instructions with pt during this phone conversation, he verbalized understanding.

## 2012-05-30 NOTE — Progress Notes (Signed)
[facility] SITE 3 NUCLEAR MED [address] [place], [place] [address] [phone]    Cardiology Nuclear Med Study  Evan Lopez is a 72 y.o. male     MRN : [id number]     DOB: Jul 11, 1940  Procedure Date: 05/30/2012  Nuclear Med Background Indication for Stress Test:  Evaluation for Ischemia History:  No prior known history of CAD, 15 years ago GXT: normal per patient, and 05-23-12 Echo: EF=50-55% Cardiac Risk Factors: Lipids  Symptoms:Chest pressure/tightness with exertion (last occurrence yesterday),   Diaphoresis, DOE, Fatigue with Exertion, Light-Headedness, Nausea and Syncope episode in November.   Nuclear Pre-Procedure Caffeine/Decaff Intake:  None NPO After: 7:30pm   Lungs:  clear O2 Sat: 98% on room air. IV 0.9% NS with Angio Cath:  20g  IV Site: R Hand  IV Started by:  Cathlyn Parsons, RN  Chest Size (in):  42 Cup Size: n/a  Height: 5\' 8"  (1.727 m)  Weight:  153 lb (69.4 kg)  BMI:  Body mass index is 23.26 kg/(m^2). Tech Comments: Discussed with Dr. Eden Emms the chest tightness the patient experienced while walking on the treadmill. Dr. Eden Emms discussed with the patient, and a cath was scheduled for 06/01/12. Irean Hong, RN    Nuclear Med Study 1 or 2 day study: 1 day  Stress Test Type:  Stress  Reading MD: Cassell Clement, MD  Order Authorizing Provider:  Burna Cash  Resting Radionuclide: Technetium 31m Sestamibi  Resting Radionuclide Dose: 11.0 mCi   Stress Radionuclide:  Technetium 72m Sestamibi  Stress Radionuclide Dose: 33.0 mCi           Stress Protocol Rest HR: 57 Stress HR: 134  Rest BP: 109/86 Stress BP: 196/87  Exercise Time (min): 6:00 METS: 7.0   Predicted Max HR: 151 bpm % Max HR: 88.74 bpm Rate Pressure Product: 21308    Dose of Adenosine (mg):  n/a Dose of Lexiscan: n/a mg  Dose of Atropine (mg): n/a Dose of Dobutamine: n/a mcg/kg/min (at max HR)  Stress Test Technologist: Irean Hong, RN  Nuclear Technologist:   Domenic Polite, CNMT     Rest Procedure:  Myocardial perfusion imaging was performed at rest 45 minutes following the intravenous administration of Technetium 44m Sestamibi. Rest ECG: NSR - Normal EKG  Stress Procedure:  The patient exercised on the treadmill utilizing the Bruce Protocol for Six minutes. The patient stopped due to DOE and chest tightness 8/10. There was a mild hypertensive response to exercise.  Technetium 74m Sestamibi was injected at peak exercise and myocardial perfusion imaging was performed after a brief delay. Stress ECG: With stress develops 1.5 mm ST depression with uprising slope in inferolateral leads.  QPS Raw Data Images:  Normal; no motion artifact; normal heart/lung ratio. Stress Images:  Normal homogeneous uptake in all areas of the myocardium. Rest Images:  Normal homogeneous uptake in all areas of the myocardium. Subtraction (SDS):  No evidence of ischemia. Transient Ischemic Dilatation (Normal <1.22):  1.10 Lung/Heart Ratio (Normal <0.45):  0.29  Quantitative Gated Spect Images QGS EDV:  76 ml QGS ESV:  28 ml  Impression Exercise Capacity:  Fair exercise capacity. BP Response:  Normal blood pressure response. Clinical Symptoms:  Significant chest pain. ECG Impression:  With exercise develops 1.5 mm ST depression with uprising slope in inferolateral leads. Comparison with Prior Nuclear Study: No previous nuclear study performed  Overall Impression:  Intermediate stress nuclear study. Clinically positive with significant chest pressure with exercise.  SDS 5 indicates  possible small area of inferoseptal reversible ischemia although visual images are not confirmatory. Normal LV function.  LV Ejection Fraction: 64%.  LV Wall Motion:  NL LV Function; NL Wall Motion   [employer]

## 2012-05-31 ENCOUNTER — Other Ambulatory Visit: Payer: Self-pay | Admitting: Cardiovascular Disease

## 2012-05-31 DIAGNOSIS — R079 Chest pain, unspecified: Secondary | ICD-10-CM

## 2012-06-01 ENCOUNTER — Other Ambulatory Visit (INDEPENDENT_AMBULATORY_CARE_PROVIDER_SITE_OTHER): Payer: Medicare Other

## 2012-06-01 DIAGNOSIS — Z01810 Encounter for preprocedural cardiovascular examination: Secondary | ICD-10-CM

## 2012-06-01 DIAGNOSIS — R079 Chest pain, unspecified: Secondary | ICD-10-CM

## 2012-06-01 LAB — CBC WITH DIFFERENTIAL/PLATELET
Basophils Relative: 0.3 % (ref 0.0–3.0)
Eosinophils Absolute: 0.1 10*3/uL (ref 0.0–0.7)
HCT: 40.8 % (ref 39.0–52.0)
Hemoglobin: 13.9 g/dL (ref 13.0–17.0)
Lymphs Abs: 1.5 10*3/uL (ref 0.7–4.0)
MCHC: 34.1 g/dL (ref 30.0–36.0)
MCV: 85.9 fl (ref 78.0–100.0)
Monocytes Absolute: 0.4 10*3/uL (ref 0.1–1.0)
Neutro Abs: 4.4 10*3/uL (ref 1.4–7.7)
Neutrophils Relative %: 68.1 % (ref 43.0–77.0)
RBC: 4.74 Mil/uL (ref 4.22–5.81)

## 2012-06-01 LAB — BASIC METABOLIC PANEL
BUN: 13 mg/dL (ref 6–23)
Calcium: 9.2 mg/dL (ref 8.4–10.5)
Creatinine, Ser: 1.1 mg/dL (ref 0.4–1.5)
GFR: 72.66 mL/min (ref >60.00)
Glucose, Bld: 128 mg/dL — ABNORMAL HIGH (ref 70–99)

## 2012-06-02 ENCOUNTER — Inpatient Hospital Stay (HOSPITAL_BASED_OUTPATIENT_CLINIC_OR_DEPARTMENT_OTHER)
Admission: RE | Admit: 2012-06-02 | Discharge: 2012-06-02 | Disposition: A | Discharge status: Home / Self Care | Payer: Medicare Other | Source: Ambulatory Visit | Attending: Cardiovascular Disease | Admitting: Cardiovascular Disease

## 2012-06-02 ENCOUNTER — Encounter (HOSPITAL_BASED_OUTPATIENT_CLINIC_OR_DEPARTMENT_OTHER): Payer: Self-pay

## 2012-06-02 ENCOUNTER — Encounter (HOSPITAL_BASED_OUTPATIENT_CLINIC_OR_DEPARTMENT_OTHER)
Admission: RE | Disposition: A | Discharge status: Home / Self Care | Payer: Self-pay | Source: Ambulatory Visit | Attending: Cardiovascular Disease

## 2012-06-02 DIAGNOSIS — R079 Chest pain, unspecified: Secondary | ICD-10-CM

## 2012-06-02 DIAGNOSIS — K219 Gastro-esophageal reflux disease without esophagitis: Secondary | ICD-10-CM | POA: Insufficient documentation

## 2012-06-02 DIAGNOSIS — R0989 Other specified symptoms and signs involving the circulatory and respiratory systems: Secondary | ICD-10-CM | POA: Insufficient documentation

## 2012-06-02 DIAGNOSIS — E785 Hyperlipidemia, unspecified: Secondary | ICD-10-CM | POA: Insufficient documentation

## 2012-06-02 DIAGNOSIS — R0609 Other forms of dyspnea: Secondary | ICD-10-CM | POA: Insufficient documentation

## 2012-06-02 SURGERY — JV LEFT HEART CATHETERIZATION WITH CORONARY ANGIOGRAM
Anesthesia: Moderate Sedation

## 2012-06-02 MED ORDER — ASPIRIN 81 MG PO CHEW
324.0000 mg | CHEWABLE_TABLET | ORAL | Status: AC
  Administered 2012-06-02: 324 mg via ORAL

## 2012-06-02 MED ORDER — SODIUM CHLORIDE 0.45 % IV SOLN: INTRAVENOUS | Status: AC

## 2012-06-02 MED ORDER — SODIUM CHLORIDE 0.9 % IJ SOLN: 3.0000 mL | Freq: Two times a day (BID) | INTRAMUSCULAR | Status: DC

## 2012-06-02 MED ORDER — DIAZEPAM 2 MG PO TABS: 2.0000 mg | ORAL_TABLET | ORAL | Status: DC | PRN

## 2012-06-02 MED ORDER — SODIUM CHLORIDE 0.9 % IJ SOLN: 3.0000 mL | INTRAMUSCULAR | Status: DC | PRN

## 2012-06-02 MED ORDER — OXYCODONE-ACETAMINOPHEN 5-325 MG PO TABS: 1.0000 | ORAL_TABLET | ORAL | Status: DC | PRN

## 2012-06-02 MED ORDER — ACETAMINOPHEN 325 MG PO TABS: 650.0000 mg | ORAL_TABLET | ORAL | Status: DC | PRN

## 2012-06-02 MED ORDER — ASPIRIN EC 325 MG PO TBEC: 325.0000 mg | DELAYED_RELEASE_TABLET | Freq: Every day | ORAL | Status: DC

## 2012-06-02 MED ORDER — SODIUM CHLORIDE 0.9 % IV SOLN: 250.0000 mL | INTRAVENOUS | Status: DC | PRN

## 2012-06-02 MED ORDER — ONDANSETRON HCL 4 MG/2ML IJ SOLN: 4.0000 mg | Freq: Four times a day (QID) | INTRAMUSCULAR | Status: DC | PRN

## 2012-06-02 MED ORDER — SODIUM CHLORIDE 0.9 % IV SOLN
250.0000 mL | INTRAVENOUS | Status: DC | PRN
  Administered 2012-06-02: 250 mL via INTRAVENOUS

## 2012-06-02 NOTE — Interval H&P Note (Signed)
History and Physical Interval Note:  06/02/2012 9:08 AM  Evan Lopez  has presented today for surgery, with the diagnosis of Anormal Stres Test  The various methods of treatment have been discussed with the patient and family. After consideration of risks, benefits and other options for treatment, the patient has consented to  Procedure(s) (LRB) with comments: JV LEFT HEART CATHETERIZATION WITH CORONARY ANGIOGRAM (N/A) as a surgical intervention .  The patient's history has been reviewed, patient examined, no change in status, stable for surgery.  I have reviewed the patient's chart and labs.  Questions were answered to the patient's satisfaction.     Charlton Haws  Recurrent chest pain and abnormal myovue  Cath indicated to define anatomy

## 2012-06-02 NOTE — CV Procedure (Signed)
      Catheterization   Indication:  Chest Pain abnormal functional study  Procedure: After informed consent and clinical "time out" the right groin was prepped and draped in a sterile fashion.  A 5Fr sheath was placed in the right femoral artery using seldinger technique and local lidocaine.  Standard JL4, JR4 and angled pigtail catheters were used to engage the coronary arteries.  Coronary arteries were visualized in orthogonal views using caudal and cranial angulation.  RAO ventriculography was done using 28 * cc of contrast.    Medications:   Versed: 2 mg's  Fentanyl: 25 ug's  Coronary Arteries: Right dominant with no anomalies  LM:  Normal  LAD: Normal    IM: small vessel normal  D1: small vessel normal  D2 small vessel normal  D3: small vessel normal  Circumflex:  normal   OM1: large and normal  OM2: large and normal  RCA: nromal   PDA: normal  PLA: x2 branches normal  Ventriculography: EF: 60 %, no RWMA;s  Hemodynamics:  Aortic Pressure: 134 64 mmHg  LV Pressure:  144 14  mmHg  Impression:  No significant CAD Tolerated procedure well  Charlton Haws 06/02/2012 9:45 AM

## 2012-06-02 NOTE — OR Nursing (Signed)
Meal served 

## 2012-06-02 NOTE — H&P (View-Only) (Signed)
Patient ID: Evan Lopez, male   DOB: 05/07/1941, 71 y.o.   MRN: [id number] 72 yo referred for chest pain and syncope  On 04/08/2012 about 3 weeks ago, Evan Lopez experienced an unwitnessed fainting spell where he lost consciousness lasting an unknown amount of time while he was deer hunting. He was strapped up in a tree with a harness; he was heavily clothed for the cold weather; as the day progressed he got a little warmer and started feeling nauseated and dizzy. He felt sick like this when he was going from a strapped sitting to a standing position to take off the tree harness because he did not want to get sick while up in the tree; when he stood up he fainted and had an episode of LOC. He denies having any HAs, vision changes, CP or diaphoresis prior to this, he just felt a little nauseated and warm, after the incident he denies being confused or having weakness. However he did started having intermittent numbness and tingling in his left arm radiating down to his left leg and the bottom of his left foot. He still has residual left foot numbness and tingling. Risk factors for CAD/CVA include hyperlipidemia. He denies Diabetes or HTN. He has a h/o shoulder pain ? Rotator cuff issues and knee arthritis   Additionally he has been experiencing midepigastric chest pain with walking his dog for the last 1 month or more. Described as intermittent chest tightness, lasting 2-5 minutes, worse with heavy exertion but if he starts breathing deeply then it helps. He used to jog but cannot do that anymore, he denies any , PND. DOE, SOB. orthopnea, lower extremity swelling/pedal edema. Deneis palpitations or diaphoresis. He has a h/o well controlled GERD on PPI BID. Marland Kitchen   Since episode he does get exertional pressure in his chest and dyspnea No resting pain.  When he stops pain goes away quickly  ROS: Denies fever, malais, weight loss, blurry vision, decreased visual acuity, cough, sputum, SOB, hemoptysis, pleuritic  pain, palpitaitons, heartburn, abdominal pain, melena, lower extremity edema, claudication, or rash.  All other systems reviewed and negative  General: Affect appropriate Healthy:  appears stated age HEENT: normal Neck supple with no adenopathy JVP normal no bruits no thyromegaly Lungs clear with no wheezing and good diaphragmatic motion Heart:  S1/S2 no murmur, no rub, gallop or click PMI normal Abdomen: benighn, BS positve, no tenderness, no AAA no bruit.  No HSM or HJR Distal pulses intact with no bruits No edema Neuro non-focal Skin warm and dry No muscular weakness   Current Outpatient Prescriptions  Medication Sig Dispense Refill  . omeprazole (PRILOSEC) 20 MG capsule Take 20 mg by mouth 2 (two) times daily.      . rosuvastatin (CRESTOR) 40 MG tablet Take 1 tablet (40 mg total) by mouth daily.  30 tablet  2    Allergies  Review of patient's allergies indicates no known allergies.  Electrocardiogram:  SR rate 68 normal   Assessment and Plan

## 2012-06-02 NOTE — OR Nursing (Signed)
Discharge instructions reviewed and signed, pt stated understanding, ambulated in hall without difficulty, site level 0, transported to wife's car via wheelchair 

## 2012-06-02 NOTE — OR Nursing (Signed)
Dr Nishan at bedside to discuss results and treatment plan with pt and family 

## 2012-06-02 NOTE — Progress Notes (Signed)
Allen's test performed on right hand with negative results, waveform completely flat with spo2.

## 2012-06-02 NOTE — OR Nursing (Signed)
Tegaderm dressing applied, site level 0, bedrest begins at 1000 

## 2012-07-31 ENCOUNTER — Other Ambulatory Visit: Payer: Self-pay | Admitting: Family Medicine

## 2012-08-31 ENCOUNTER — Ambulatory Visit (INDEPENDENT_AMBULATORY_CARE_PROVIDER_SITE_OTHER): Payer: Medicare Other | Admitting: Family Medicine

## 2012-08-31 VITALS — BP 118/75 | HR 70 | Temp 97.9°F | Resp 16 | Ht 67.0 in | Wt 154.0 lb

## 2012-08-31 DIAGNOSIS — E785 Hyperlipidemia, unspecified: Secondary | ICD-10-CM

## 2012-08-31 DIAGNOSIS — L719 Rosacea, unspecified: Secondary | ICD-10-CM

## 2012-08-31 LAB — COMPREHENSIVE METABOLIC PANEL
ALT: 22 U/L (ref 0–53)
Albumin: 4.5 g/dL (ref 3.5–5.2)
Alkaline Phosphatase: 86 U/L (ref 39–117)
CO2: 26 mEq/L (ref 19–32)
Glucose, Bld: 91 mg/dL (ref 70–99)
Potassium: 4.4 mEq/L (ref 3.5–5.3)
Sodium: 140 mEq/L (ref 135–145)
Total Protein: 7.1 g/dL (ref 6.0–8.3)

## 2012-08-31 LAB — LIPID PANEL
LDL Cholesterol: 81 mg/dL (ref 0–99)
Triglycerides: 77 mg/dL (ref <150)

## 2012-08-31 MED ORDER — METRONIDAZOLE 1 % EX GEL: Freq: Every day | CUTANEOUS | Status: DC

## 2012-08-31 NOTE — Patient Instructions (Addendum)
We will be in touch when your cholesterol results come in.  Please try the metrogel for your facial rash, and let us know if not helpful in the next few weeks

## 2012-08-31 NOTE — Progress Notes (Signed)
  Subjective:    Patient ID: AMDREW OBOYLE, male    DOB: 11/07/1940, 72 y.o.   MRN: [id number]  HPI 72 yo male with recently diagnosed hyperlipidemia, who is here for cholesterol check. Was started on Crestor 40 mg approximately 4 months ago. Denies any side effects from the medication. Also had cardiac workup done in within the last few months, which included cath, which was negative. Patient states that the Crestor is very expensive for him and would like to know if there are any other options. Also reports rash across face. Has been there for approximately 2 years. Generally just looks red around cheeks and forehead. Has one irritated spot on R cheek, which he is consistently shaving over. Is outside frequently with a lot of sun exposure.    Review of Systems  Constitutional: Negative.   Respiratory: Negative.   Cardiovascular: Negative.   Gastrointestinal: Negative.   Skin: Positive for rash (on face).  Neurological: Negative.        Objective:   Physical Exam  Constitutional: He is oriented to person, place, and time. He appears well-developed and well-nourished.  HENT:  Head:    Rash consisting of what look like dilated blood vessels. Non tender. Non-scaling.  Neck: Normal range of motion. Neck supple.  Cardiovascular: Normal rate, regular rhythm and normal heart sounds.   Pulmonary/Chest: Effort normal and breath sounds normal.  Abdominal: Soft. Bowel sounds are normal.  Lymphadenopathy:    He has no cervical adenopathy.  Neurological: He is alert and oriented to person, place, and time.  Skin: Skin is warm and dry.   He does have a small area of scaling on the right cheek near his sideburn.         Assessment & Plan:  72 yo male seen today for cholesterol check and rash on his face.  1) Hyperlipidemia: Lipid panel and liver function tests were drawn today. Will wait on results of lipid panel to determine if we can change his medication. If results are excellent,  will change to Pravachol. If still elevated, will remain on Crestor. Advised to check with other pharmacies to see if they have cheaper prices. 2) Rosacea: Rash on face likely rosacea. Has one mildly concerning spot on R cheek. Due to amount of sun exposure, will have patient follow up with derm to check for any questionable lesions. Will start on metrogel for rash. Will discuss results with patient and let him know about follow up at that time.

## 2012-09-01 ENCOUNTER — Encounter: Payer: Self-pay | Admitting: Family Medicine

## 2012-10-09 ENCOUNTER — Ambulatory Visit (INDEPENDENT_AMBULATORY_CARE_PROVIDER_SITE_OTHER): Payer: Medicare Other | Admitting: Emergency Medicine

## 2012-10-09 VITALS — BP 136/80 | HR 61 | Temp 97.7°F | Resp 20 | Ht 64.0 in | Wt 157.0 lb

## 2012-10-09 DIAGNOSIS — S335XXA Sprain of ligaments of lumbar spine, initial encounter: Secondary | ICD-10-CM

## 2012-10-09 MED ORDER — NAPROXEN SODIUM 550 MG PO TABS: 550.0000 mg | ORAL_TABLET | Freq: Two times a day (BID) | ORAL | Status: DC

## 2012-10-09 MED ORDER — HYDROCODONE-ACETAMINOPHEN 5-325 MG PO TABS: 1.0000 | ORAL_TABLET | ORAL | Status: DC | PRN

## 2012-10-09 MED ORDER — CYCLOBENZAPRINE HCL 10 MG PO TABS: 10.0000 mg | ORAL_TABLET | Freq: Three times a day (TID) | ORAL | Status: DC | PRN

## 2012-10-09 NOTE — Patient Instructions (Addendum)

## 2012-10-09 NOTE — Progress Notes (Signed)
[facility] [address] [place] [address] [phone]  Date:  10/09/2012   Name:  Evan Lopez   DOB:  25-Jun-1940   MRN:  [id number]  PCP:  Tally Due, MD    Chief Complaint: Back Pain   History of Present Illness:  Evan Lopez is a 72 y.o. very pleasant male patient who presents with the following:  Moved a flower pot two days ago and developed marked left lower back pain.  Not radiating and no associated neuro symptoms.  Worse with activities   No direct injury. No chronic back problems.  No improvement with over the counter medications or other home remedies. Denies other complaint or health concern today.   Patient Active Problem List   Diagnosis Date Noted  . Chest pain 05/13/2012  . Syncope 05/13/2012  . Hyperlipemia 06/24/2011  . 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 GERD (gastroesophageal reflux disease) 06/24/2011  . 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 NONSPECIFIC ABNORMAL FINDING IN STOOL CONTENTS 02/04/2009    Past Medical History  Diagnosis Date  . GERD (gastroesophageal reflux disease)   . Hyperlipidemia   . DIVERTICULOSIS OF COLON   . Nonspecific abnormal finding in stool contents   . Kidney calculi     Past Surgical History  Procedure Laterality Date  . Appendectomy      History  Substance Use Topics  . Smoking status: Never Smoker   . Smokeless tobacco: Not on file  . Alcohol Use: No    Family History  Problem Relation Age of Onset  . Cancer Mother   . Alcohol abuse Father     No Known Allergies  Medication list has been reviewed and updated.  Current Outpatient Prescriptions on File Prior to Visit  Medication Sig Dispense Refill  . CRESTOR 40 MG tablet TAKE 1 TABLET (40 MG TOTAL) BY MOUTH DAILY.  30 tablet  2  . omeprazole (PRILOSEC) 20 MG capsule Take 20 mg by mouth 2 (two) times daily.      . metroNIDAZOLE (METROGEL) 1 % gel Apply topically daily.  45 g  1   No current  facility-administered medications on file prior to visit.    Review of Systems:  As per HPI, otherwise negative.    Physical Examination: Filed Vitals:   10/09/12 1103  BP: 136/80  Pulse: 61  Temp: 97.7 F (36.5 C)  Resp: 20   Filed Vitals:   10/09/12 1103  Height: 5\' 4"  (1.626 m)  Weight: 157 lb (71.215 kg)   Body mass index is 26.94 kg/(m^2). Ideal Body Weight: Weight in (lb) to have BMI = 25: 145.3   GEN: WDWN, NAD, Non-toxic, Alert & Oriented x 3 HEENT: Atraumatic, Normocephalic.  Ears and Nose: No external deformity. EXTR: No clubbing/cyanosis/edema NEURO: Normal gait.  PSYCH: Normally interactive. Conversant. Not depressed or anxious appearing.  Calm demeanor.  BACK:  Neuro intact.  SLR negative.  Tender left para spinous muscles  Assessment and Plan: Lumbar strain Anaprox Flexeril vicodin Rest Follow up in one week   Signed,  Phillips Odor, MD

## 2012-11-19 ENCOUNTER — Emergency Department (HOSPITAL_COMMUNITY)
Admission: EM | Admit: 2012-11-19 | Discharge: 2012-11-19 | Disposition: A | Discharge status: Home / Self Care | Payer: Medicare Other | Attending: Emergency Medicine | Admitting: Emergency Medicine

## 2012-11-19 ENCOUNTER — Emergency Department (HOSPITAL_COMMUNITY): Payer: Medicare Other

## 2012-11-19 ENCOUNTER — Encounter (HOSPITAL_COMMUNITY): Payer: Self-pay | Admitting: Emergency Medicine

## 2012-11-19 ENCOUNTER — Encounter: Payer: Self-pay | Admitting: Family Medicine

## 2012-11-19 ENCOUNTER — Ambulatory Visit (INDEPENDENT_AMBULATORY_CARE_PROVIDER_SITE_OTHER): Payer: Medicare Other | Admitting: Family Medicine

## 2012-11-19 VITALS — BP 130/80 | HR 80

## 2012-11-19 DIAGNOSIS — Z7982 Long term (current) use of aspirin: Secondary | ICD-10-CM | POA: Insufficient documentation

## 2012-11-19 DIAGNOSIS — Z87442 Personal history of urinary calculi: Secondary | ICD-10-CM | POA: Insufficient documentation

## 2012-11-19 DIAGNOSIS — M79609 Pain in unspecified limb: Secondary | ICD-10-CM | POA: Insufficient documentation

## 2012-11-19 DIAGNOSIS — Z8639 Personal history of other endocrine, nutritional and metabolic disease: Secondary | ICD-10-CM | POA: Insufficient documentation

## 2012-11-19 DIAGNOSIS — Z8719 Personal history of other diseases of the digestive system: Secondary | ICD-10-CM | POA: Insufficient documentation

## 2012-11-19 DIAGNOSIS — R55 Syncope and collapse: Secondary | ICD-10-CM

## 2012-11-19 DIAGNOSIS — Z862 Personal history of diseases of the blood and blood-forming organs and certain disorders involving the immune mechanism: Secondary | ICD-10-CM | POA: Insufficient documentation

## 2012-11-19 DIAGNOSIS — R1032 Left lower quadrant pain: Secondary | ICD-10-CM

## 2012-11-19 DIAGNOSIS — R110 Nausea: Secondary | ICD-10-CM | POA: Insufficient documentation

## 2012-11-19 DIAGNOSIS — R404 Transient alteration of awareness: Secondary | ICD-10-CM | POA: Insufficient documentation

## 2012-11-19 DIAGNOSIS — Z79899 Other long term (current) drug therapy: Secondary | ICD-10-CM | POA: Insufficient documentation

## 2012-11-19 DIAGNOSIS — K219 Gastro-esophageal reflux disease without esophagitis: Secondary | ICD-10-CM | POA: Insufficient documentation

## 2012-11-19 DIAGNOSIS — E860 Dehydration: Secondary | ICD-10-CM

## 2012-11-19 DIAGNOSIS — K5792 Diverticulitis of intestine, part unspecified, without perforation or abscess without bleeding: Secondary | ICD-10-CM

## 2012-11-19 DIAGNOSIS — K5732 Diverticulitis of large intestine without perforation or abscess without bleeding: Secondary | ICD-10-CM | POA: Insufficient documentation

## 2012-11-19 DIAGNOSIS — I959 Hypotension, unspecified: Secondary | ICD-10-CM

## 2012-11-19 LAB — CBC WITH DIFFERENTIAL/PLATELET
Basophils Relative: 0 % (ref 0–1)
HCT: 37.9 % — ABNORMAL LOW (ref 39.0–52.0)
Hemoglobin: 13.4 g/dL (ref 13.0–17.0)
Lymphocytes Relative: 6 % — ABNORMAL LOW (ref 12–46)
Lymphs Abs: 0.7 10*3/uL (ref 0.7–4.0)
MCHC: 35.4 g/dL (ref 30.0–36.0)
Monocytes Absolute: 0.6 10*3/uL (ref 0.1–1.0)
Monocytes Relative: 5 % (ref 3–12)
Neutro Abs: 10.8 10*3/uL — ABNORMAL HIGH (ref 1.7–7.7)
Neutrophils Relative %: 89 % — ABNORMAL HIGH (ref 43–77)
RBC: 4.59 MIL/uL (ref 4.22–5.81)

## 2012-11-19 LAB — URINALYSIS, ROUTINE W REFLEX MICROSCOPIC
Glucose, UA: NEGATIVE mg/dL
Ketones, ur: NEGATIVE mg/dL
Leukocytes, UA: NEGATIVE
Protein, ur: NEGATIVE mg/dL
Urobilinogen, UA: 0.2 mg/dL (ref 0.0–1.0)

## 2012-11-19 LAB — COMPREHENSIVE METABOLIC PANEL
Alkaline Phosphatase: 88 U/L (ref 39–117)
BUN: 13 mg/dL (ref 6–23)
CO2: 26 mEq/L (ref 19–32)
Chloride: 103 mEq/L (ref 96–112)
Creatinine, Ser: 1.06 mg/dL (ref 0.50–1.35)
GFR calc Af Amer: 80 mL/min — ABNORMAL LOW (ref >90)
GFR calc non Af Amer: 69 mL/min — ABNORMAL LOW (ref >90)
Glucose, Bld: 119 mg/dL — ABNORMAL HIGH (ref 70–99)
Potassium: 3.7 mEq/L (ref 3.5–5.1)
Total Bilirubin: 1.2 mg/dL (ref 0.3–1.2)

## 2012-11-19 MED ORDER — OXYCODONE-ACETAMINOPHEN 5-325 MG PO TABS: ORAL_TABLET | ORAL | Status: DC

## 2012-11-19 MED ORDER — IOHEXOL 300 MG/ML  SOLN
25.0000 mL | Freq: Once | INTRAMUSCULAR | Status: AC | PRN
  Administered 2012-11-19: 25 mL via ORAL

## 2012-11-19 MED ORDER — MORPHINE SULFATE 4 MG/ML IJ SOLN
4.0000 mg | Freq: Once | INTRAMUSCULAR | Status: AC
  Administered 2012-11-19: 4 mg via INTRAVENOUS
  Filled 2012-11-19: qty 1

## 2012-11-19 MED ORDER — IOHEXOL 300 MG/ML  SOLN
100.0000 mL | Freq: Once | INTRAMUSCULAR | Status: AC | PRN
  Administered 2012-11-19: 100 mL via INTRAVENOUS

## 2012-11-19 MED ORDER — METRONIDAZOLE IN NACL 5-0.79 MG/ML-% IV SOLN
500.0000 mg | Freq: Once | INTRAVENOUS | Status: AC
  Administered 2012-11-19: 500 mg via INTRAVENOUS
  Filled 2012-11-19: qty 100

## 2012-11-19 MED ORDER — CIPROFLOXACIN HCL 500 MG PO TABS: 500.0000 mg | ORAL_TABLET | Freq: Two times a day (BID) | ORAL | Status: DC

## 2012-11-19 MED ORDER — METRONIDAZOLE 500 MG PO TABS: 500.0000 mg | ORAL_TABLET | Freq: Three times a day (TID) | ORAL | Status: DC

## 2012-11-19 MED ORDER — CIPROFLOXACIN IN D5W 400 MG/200ML IV SOLN
400.0000 mg | Freq: Once | INTRAVENOUS | Status: AC
  Administered 2012-11-19: 400 mg via INTRAVENOUS
  Filled 2012-11-19: qty 200

## 2012-11-19 MED ORDER — SODIUM CHLORIDE 0.9 % IV BOLUS (SEPSIS)
500.0000 mL | Freq: Once | INTRAVENOUS | Status: AC
  Administered 2012-11-19: 500 mL via INTRAVENOUS

## 2012-11-19 MED ORDER — ONDANSETRON 8 MG PO TBDP: 8.0000 mg | ORAL_TABLET | Freq: Three times a day (TID) | ORAL | Status: DC | PRN

## 2012-11-19 MED ORDER — ONDANSETRON HCL 4 MG/2ML IJ SOLN
4.0000 mg | Freq: Once | INTRAMUSCULAR | Status: AC
  Administered 2012-11-19: 4 mg via INTRAVENOUS
  Filled 2012-11-19: qty 2

## 2012-11-19 NOTE — ED Notes (Signed)
Pt c/o abd pain has a hx of diverticulitis. Rates pain 5/10. Pt went to pcp this morning for abd pain and passed out with LOC, pcp said he was responsive to pain. Pt was sitting when he passed out. Pt received initial b/p 86/72, HR 75, Last pressure 130/80. Pt was sweaty pale and shaky. Pt denies any cp, or sob. 12 lead unremarkable.

## 2012-11-19 NOTE — ED Notes (Signed)
Pt c/o left sided abd pain with no radiation, pt rates pain 6/10 without movement but states the pain gets worse with movement and palpation. Pt alert and oriented. Pt states he had diverticulitis a few years back but this pain feels worse.

## 2012-11-19 NOTE — ED Provider Notes (Signed)
History    CSN: [id number] Arrival date & time 11/19/12  1110  First MD Initiated Contact with Patient 11/19/12 1125     Chief Complaint  Patient presents with  . Abdominal Pain  . Loss of Consciousness   (Consider location/radiation/quality/duration/timing/severity/associated sxs/prior Treatment)  HPI  Pt states he was awakened at 4 am with LLQ abdominal pain. He states yesterday after traveling to [place] he had some pain in his proximal left thigh, but no abdominal pain. He states his pain is dull however if he moves, coughs, or presses on his abdomen the pain gets a lot more intense. He denies nausea or vomiting and is unsure of fever. He states he went to his doctor's office today and while sitting in the waiting room he started getting very hot and nauseated and felt very lightheaded. He passed out before they could get him back into an examining room. He does not remember being taken from the waiting room into the examining room. His wife however reports he was only unconscious for a few seconds. She states he was very pale and his blood pressure was very low. EMS reports initial blood pressure was 86/72 with heart rate of 75. They also report he was sweaty and shaky on their arrival. Patient denies any chest pain, shortness of breath, upper abdominal pain, diarrhea, or constipation. He states he's feeling better now. EMS gave 1000 cc of NS and his BP improved.  He states he had similar symptoms 2 years ago and was diagnosed with diverticulitis when he had a syncopal episode at work. He states he was treated as an outpatient.   PCP Dr Perrin Maltese GI Dr Madilyn Fireman  Past Medical History  Diagnosis Date  . GERD (gastroesophageal reflux disease)   . Hyperlipidemia   . DIVERTICULOSIS OF COLON   . Nonspecific abnormal finding in stool contents   . Kidney calculi    Past Surgical History  Procedure Laterality Date  . Appendectomy     Family History  Problem Relation Age of Onset  . Cancer  Mother   . Alcohol abuse Father    History  Substance Use Topics  . Smoking status: Never Smoker   . Smokeless tobacco: Not on file  . Alcohol Use: No  retired Lives at home Lives with spouse   Review of Systems  All other systems reviewed and are negative.    Allergies  Review of patient's allergies indicates no known allergies.  Home Medications   Current Outpatient Rx  Name  Route  Sig  Dispense  Refill  . aspirin 81 MG tablet   Oral   Take 81 mg by mouth daily.         . Aspirin-Acetaminophen-Caffeine (EXCEDRIN PO)   Oral   Take 1-2 tablets by mouth every 8 (eight) hours as needed (for pain).         . CRESTOR 40 MG tablet      TAKE 1 TABLET (40 MG TOTAL) BY MOUTH DAILY.   30 tablet   2   . metroNIDAZOLE (METROGEL) 1 % gel   Topical   Apply topically daily.   45 g   1   . omeprazole (PRILOSEC) 20 MG capsule   Oral   Take 20 mg by mouth 2 (two) times daily.          BP 121/71  Pulse 82  Temp(Src) 98.4 F (36.9 C) (Oral)  Resp 15  SpO2 99%  Vital signs normal   Physical Exam  Nursing note and vitals reviewed. Constitutional: He is oriented to person, place, and time. He appears well-developed and well-nourished.  Non-toxic appearance. He does not appear ill. No distress.  HENT:  Head: Normocephalic and atraumatic.  Right Ear: External ear normal.  Left Ear: External ear normal.  Nose: Nose normal. No mucosal edema or rhinorrhea.  Mouth/Throat: Oropharynx is clear and moist and mucous membranes are normal. No dental abscesses or edematous.  Eyes: Conjunctivae and EOM are normal. Pupils are equal, round, and reactive to light.  Neck: Normal range of motion and full passive range of motion without pain. Neck supple.  Cardiovascular: Normal rate, regular rhythm and normal heart sounds.  Exam reveals no gallop and no friction rub.   No murmur heard. Pulmonary/Chest: Effort normal and breath sounds normal. No respiratory distress. He has no  wheezes. He has no rhonchi. He has no rales. He exhibits no tenderness and no crepitus.  Abdominal: Soft. Normal appearance and bowel sounds are normal. He exhibits no distension. There is tenderness. There is guarding. There is no rebound.    + Rovsings sign, palpation in RLQ and LUQ causes pain in the LLQ, area of thigh pain noted  Musculoskeletal: Normal range of motion. He exhibits no edema and no tenderness.  Moves all extremities well.   Neurological: He is alert and oriented to person, place, and time. He has normal strength. No cranial nerve deficit.  Skin: Skin is warm, dry and intact. No rash noted. No erythema. No pallor.  Psychiatric: He has a normal mood and affect. His speech is normal and behavior is normal. His mood appears not anxious.    ED Course  Procedures (including critical care time)  Medications  ciprofloxacin (CIPRO) IVPB 400 mg (400 mg Intravenous New Bag/Given 11/19/12 1503)  morphine 4 MG/ML injection 4 mg (4 mg Intravenous Given 11/19/12 1206)  ondansetron (ZOFRAN) injection 4 mg (4 mg Intravenous Given 11/19/12 1205)  sodium chloride 0.9 % bolus 500 mL (0 mLs Intravenous Stopped 11/19/12 1251)  iohexol (OMNIPAQUE) 300 MG/ML solution 25 mL (25 mLs Oral Contrast Given 11/19/12 1211)  metroNIDAZOLE (FLAGYL) IVPB 500 mg (0 mg Intravenous Stopped 11/19/12 1502)  iohexol (OMNIPAQUE) 300 MG/ML solution 100 mL (100 mLs Intravenous Contrast Given 11/19/12 1411)  morphine 4 MG/ML injection 4 mg (4 mg Intravenous Given 11/19/12 1513)   Patient was given IV pain and nausea medication. He was given IV antibiotics for his presumed diverticulitis while waiting to get his CT scan.  Reviewed results with patient, he wants to go home and take antibiotics as an outpatient. We discussed diet.   Patient's blood pressure has been stable while he is in the ED. His syncopal episode at his doctor's office was most likely a vasovagal episode from pain and nausea.   Results for orders  placed during the hospital encounter of 11/19/12  CBC WITH DIFFERENTIAL      Result Value Range   WBC 12.1 (*) 4.0 - 10.5 K/uL   RBC 4.59  4.22 - 5.81 MIL/uL   Hemoglobin 13.4  13.0 - 17.0 g/dL   HCT 21.3 (*) 08.6 - 57.8 %   MCV 82.6  78.0 - 100.0 fL   MCH 29.2  26.0 - 34.0 pg   MCHC 35.4  30.0 - 36.0 g/dL   RDW 46.9  62.9 - 52.8 %   Platelets 162  150 - 400 K/uL   Neutrophils Relative % 89 (*) 43 - 77 %   Neutro Abs 10.8 (*) 1.7 -  7.7 K/uL   Lymphocytes Relative 6 (*) 12 - 46 %   Lymphs Abs 0.7  0.7 - 4.0 K/uL   Monocytes Relative 5  3 - 12 %   Monocytes Absolute 0.6  0.1 - 1.0 K/uL   Eosinophils Relative 0  0 - 5 %   Eosinophils Absolute 0.0  0.0 - 0.7 K/uL   Basophils Relative 0  0 - 1 %   Basophils Absolute 0.0  0.0 - 0.1 K/uL  COMPREHENSIVE METABOLIC PANEL      Result Value Range   Sodium 138  135 - 145 mEq/L   Potassium 3.7  3.5 - 5.1 mEq/L   Chloride 103  96 - 112 mEq/L   CO2 26  19 - 32 mEq/L   Glucose, Bld 119 (*) 70 - 99 mg/dL   BUN 13  6 - 23 mg/dL   Creatinine, Ser 1.61  0.50 - 1.35 mg/dL   Calcium 8.6  8.4 - 09.6 mg/dL   Total Protein 6.4  6.0 - 8.3 g/dL   Albumin 3.7  3.5 - 5.2 g/dL   AST 20  0 - 37 U/L   ALT 15  0 - 53 U/L   Alkaline Phosphatase 88  39 - 117 U/L   Total Bilirubin 1.2  0.3 - 1.2 mg/dL   GFR calc non Af Amer 69 (*) >90 mL/min   GFR calc Af Amer 80 (*) >90 mL/min  URINALYSIS, ROUTINE W REFLEX MICROSCOPIC      Result Value Range   Color, Urine YELLOW  YELLOW   APPearance CLEAR  CLEAR   Specific Gravity, Urine 1.013  1.005 - 1.030   pH 7.5  5.0 - 8.0   Glucose, UA NEGATIVE  NEGATIVE mg/dL   Hgb urine dipstick NEGATIVE  NEGATIVE   Bilirubin Urine NEGATIVE  NEGATIVE   Ketones, ur NEGATIVE  NEGATIVE mg/dL   Protein, ur NEGATIVE  NEGATIVE mg/dL   Urobilinogen, UA 0.2  0.0 - 1.0 mg/dL   Nitrite NEGATIVE  NEGATIVE   Leukocytes, UA NEGATIVE  NEGATIVE  POCT I-STAT TROPONIN I      Result Value Range   Troponin i, poc 0.00  0.00 - 0.08 ng/mL    Comment 3             Laboratory interpretation all normal except leukocytosis   Ct Abdomen Pelvis W Contrast  11/19/2012   *RADIOLOGY REPORT*  Clinical Data: Left abdominal pain.  CT ABDOMEN AND PELVIS WITH CONTRAST  Technique:  Multidetector CT imaging of the abdomen and pelvis was performed following the standard protocol during bolus administration of intravenous contrast.  Contrast: OMNIPAQUE IOHEXOL 300 MG/ML  SOLN  Comparison: 12/08/2010  Findings: Dependent subsegmental atelectasis noted in both lower lobes The liver, spleen, pancreas, and adrenal glands appear unremarkable.  The gallbladder and biliary system appear unremarkable.  A periampullary duodenal diverticulum does not appear inflamed.  2 mm left kidney lower pole nonobstructive calculus.  1 mm left kidney upper pole nonobstructive calculus.  Mild anterior cortical scarring in the left kidney upper pole, stable from prior.  No ureteral calculus.  Urinary bladder unremarkable.  No pathologic adenopathy.  Focal inflammation of the junction of the descending and sigmoid colon noted with localized diverticula and mesenteric stranding. Appearance favors acute diverticulitis.  Small adjacent suspected lymph node in the vicinity of the inflammation, 0.7 cm in short axis, image 49 of series 2.  IMPRESSION: 1.  Focal mesenteric and colonic inflammation at the junction of the  descending and sigmoid colon, favoring acute diverticulitis.  2.  Nonobstructive left nephrolithiasis.   Original Report Authenticated By: Gaylyn Rong, M.D.      Date: 11/19/2012  Rate: 89  Rhythm: normal sinus rhythm  QRS Axis: normal  Intervals: normal  ST/T Wave abnormalities: normal  Conduction Disutrbances:none  Narrative Interpretation: PVC  Old EKG Reviewed: none available    1. Diverticulitis   2. Syncope   3. Vasovagal episode     New Prescriptions   CIPROFLOXACIN (CIPRO) 500 MG TABLET    Take 1 tablet (500 mg total) by mouth 2 (two)  times daily.   METRONIDAZOLE (FLAGYL) 500 MG TABLET    Take 1 tablet (500 mg total) by mouth 3 (three) times daily.   ONDANSETRON (ZOFRAN ODT) 8 MG DISINTEGRATING TABLET    Take 1 tablet (8 mg total) by mouth every 8 (eight) hours as needed for nausea.   OXYCODONE-ACETAMINOPHEN (PERCOCET/ROXICET) 5-325 MG PER TABLET    Take 1 or 2 po Q 6hrs for pain    Plan discharge   Devoria Albe, MD, FACEP       MDM    Ward Givens, MD 11/19/12 [phone]

## 2012-11-19 NOTE — ED Notes (Signed)
CT notified pt completed cup of contrast, ct stated they will come get him after labs have resulted.

## 2012-11-19 NOTE — ED Notes (Signed)
Discharge instructions reviewed. Pt verbalized understanding.  

## 2012-11-19 NOTE — Progress Notes (Signed)
  Subjective:    Patient ID: Evan Lopez, male    DOB: 1941-03-07, 72 y.o.   MRN: [id number]  HPI Evan Lopez is a 72 y.o. male  Pulled emergently form lobby - collapsed into chair in lobby.  Responded to physical stimuli, but clammy, soft speech.  Placed in room 6 on monitor - sinus rhythm.intitla BP 86/62  O2 [place] at 2 liters - IV placed L AC with NS.   Per wife - LLQ abd pain since last night, worse this am.  Hx of diverticultis and GERD, no hx of abscess or rupture, no hx of PUD/bleeding ulcer. No chest pain/dyspnea.    Review of Systems As above.     Objective:   Physical Exam  Vitals reviewed. Constitutional: He is oriented to person, place, and time. Vital signs are normal. He appears lethargic. He has a sickly appearance. He appears ill.  Cardiovascular: Normal rate, regular rhythm, normal heart sounds and intact distal pulses.   Pulmonary/Chest: Effort normal and breath sounds normal.  Abdominal: Normal appearance. He exhibits pulsatile midline mass. He exhibits no distension. There is hepatosplenomegaly. There is tenderness in the left lower quadrant. There is rebound and guarding. There is no rigidity and no CVA tenderness.  Neurological: He is oriented to person, place, and time. He appears lethargic.  Skin: Skin is warm. He is diaphoretic.       Assessment & Plan:  Evan Lopez is a 72 y.o. male Syncope and collapse  LLQ abdominal pain  Hx of diverticulitis of colon  Hypotension, unspecified  Emergent response as above to waiting room, placed on monitor, IV placed, O@ placed.  Initial hypotension improved quickly - suspect vagal component with pain. LLQ abd pain with possible diverticulitis, but ddx includes perf. EMS called for transport, transferred care approx [phone], charge Nurse advised at [facility] ER.

## 2012-11-19 NOTE — ED Notes (Signed)
Pt states he felt like he was going to pass out at the dr office this morning and told his wife he didn't feel well she went to go get help and then he passed out.

## 2012-11-19 NOTE — ED Notes (Signed)
Dr.Knapp at bedside  

## 2012-11-22 ENCOUNTER — Ambulatory Visit (INDEPENDENT_AMBULATORY_CARE_PROVIDER_SITE_OTHER): Payer: Medicare Other | Admitting: Internal Medicine

## 2012-11-22 VITALS — BP 102/60 | HR 67 | Temp 97.8°F | Resp 18 | Ht 67.0 in | Wt 153.0 lb

## 2012-11-22 DIAGNOSIS — R1032 Left lower quadrant pain: Secondary | ICD-10-CM

## 2012-11-22 DIAGNOSIS — K5732 Diverticulitis of large intestine without perforation or abscess without bleeding: Secondary | ICD-10-CM

## 2012-11-22 LAB — POCT CBC
HCT, POC: 40.8 % — AB (ref 43.5–53.7)
Hemoglobin: 13.1 g/dL — AB (ref 14.1–18.1)
MCH, POC: 28.7 pg (ref 27–31.2)
MCHC: 32.1 g/dL (ref 31.8–35.4)
MID (cbc): 0.4 (ref 0–0.9)
POC MID %: 5.5 %M (ref 0–12)

## 2012-11-22 LAB — IFOBT (OCCULT BLOOD): IFOBT: NEGATIVE

## 2012-11-22 LAB — POCT URINALYSIS DIPSTICK
Blood, UA: 1.02
Protein, UA: NEGATIVE
Urobilinogen, UA: 0.2
pH, UA: 6

## 2012-11-22 LAB — POCT UA - MICROSCOPIC ONLY
Bacteria, U Microscopic: NEGATIVE
Casts, Ur, LPF, POC: NEGATIVE
Crystals, Ur, HPF, POC: NEGATIVE
Mucus, UA: NEGATIVE

## 2012-11-22 MED ORDER — CEFTRIAXONE SODIUM 1 G IJ SOLR
1.0000 g | Freq: Once | INTRAMUSCULAR | Status: AC
  Administered 2012-11-22: 1 g via INTRAMUSCULAR

## 2012-11-22 MED ORDER — HYDROCODONE-ACETAMINOPHEN 5-325 MG PO TABS: 1.0000 | ORAL_TABLET | Freq: Four times a day (QID) | ORAL | Status: DC | PRN

## 2012-11-22 NOTE — Progress Notes (Signed)
  Subjective:    Patient ID: Evan Lopez, male    DOB: 11-04-1940, 72 y.o.   MRN: [id number]  HPI 72 y.o. male patient here today for follow up. Patient here last sat. Was sent to [place] and dx with diverticulitis. Received fluids and antibiotics.  Patient still continuing to have pain on left side. Still having watery bowel movements/ no blood. Denies any tarry stools. Patient still has lack of appetite. Feels like it is getting worse again, still on cipro and flagyl. CT reviewed   Review of Systems     Objective:   Physical Exam  Vitals reviewed. Constitutional: He is oriented to person, place, and time. He appears well-developed and well-nourished. He appears distressed.  HENT:  Mouth/Throat: Oropharynx is clear and moist.  Eyes: EOM are normal. No scleral icterus.  Neck: Neck supple.  Cardiovascular: Normal rate.   Pulmonary/Chest: Effort normal.  Abdominal: He exhibits no mass. There is tenderness. There is rebound. There is no guarding.  Genitourinary: Rectum normal, prostate normal and penis normal.  Neurological: He is alert and oriented to person, place, and time. He exhibits normal muscle tone. Coordination normal.  Psychiatric: He has a normal mood and affect.   Results for orders placed in visit on 11/22/12  IFOBT (OCCULT BLOOD)      Result Value Range   IFOBT Negative    POCT CBC      Result Value Range   WBC 7.1  4.6 - 10.2 K/uL   Lymph, poc 1.1  0.6 - 3.4   POC LYMPH PERCENT 16.0  10 - 50 %L   MID (cbc) 0.4  0 - 0.9   POC MID % 5.5  0 - 12 %M   POC Granulocyte 5.6  2 - 6.9   Granulocyte percent 78.5  37 - 80 %G   RBC 4.57 (*) 4.69 - 6.13 M/uL   Hemoglobin 13.1 (*) 14.1 - 18.1 g/dL   HCT, POC 36.6 (*) 44.0 - 53.7 %   MCV 89.3  80 - 97 fL   MCH, POC 28.7  27 - 31.2 pg   MCHC 32.1  31.8 - 35.4 g/dL   RDW, POC 34.7     Platelet Count, POC 223  142 - 424 K/uL   MPV 8.5  0 - 99.8 fL  POCT UA - MICROSCOPIC ONLY      Result Value Range   WBC, Ur,  HPF, POC neg     RBC, urine, microscopic neg     Bacteria, U Microscopic neg     Mucus, UA neg     Epithelial cells, urine per micros neg     Crystals, Ur, HPF, POC neg     Casts, Ur, LPF, POC neg     Yeast, UA neg    POCT URINALYSIS DIPSTICK      Result Value Range   Color, UA yellow     Clarity, UA clear     Glucose, UA neg     Bilirubin, UA neg     Ketones, UA neg     Spec Grav, UA       Blood, UA 1.020     pH, UA 6.0     Protein, UA neg     Urobilinogen, UA 0.2     Nitrite, UA neg     Leukocytes, UA Negative            Assessment & Plan:  Diverticulitis/Reactivation Rocephen  1g/HC for pain

## 2012-11-22 NOTE — Patient Instructions (Signed)
Diverticulitis °A diverticulum is a small pouch or sac on the colon. Diverticulosis is the presence of these diverticula on the colon. Diverticulitis is the irritation (inflammation) or infection of diverticula. °CAUSES  °The colon and its diverticula contain bacteria. If food particles block the tiny opening to a diverticulum, the bacteria inside can grow and cause an increase in pressure. This leads to infection and inflammation and is called diverticulitis. °SYMPTOMS  °· Abdominal pain and tenderness. Usually, the pain is located on the left side of your abdomen. However, it could be located elsewhere. °· Fever. °· Bloating. °· Feeling sick to your stomach (nausea). °· Throwing up (vomiting). °· Abnormal stools. °DIAGNOSIS  °Your caregiver will take a history and perform a physical exam. Since many things can cause abdominal pain, other tests may be necessary. Tests may include: °· Blood tests. °· Urine tests. °· X-ray of the abdomen. °· CT scan of the abdomen. °Sometimes, surgery is needed to determine if diverticulitis or other conditions are causing your symptoms. °TREATMENT  °Most of the time, you can be treated without surgery. Treatment includes: °· Resting the bowels by only having liquids for a few days. As you improve, you will need to eat a low-fiber diet. °· Intravenous (IV) fluids if you are losing body fluids (dehydrated). °· Antibiotic medicines that treat infections may be given. °· Pain and nausea medicine, if needed. °· Surgery if the inflamed diverticulum has burst. °HOME CARE INSTRUCTIONS  °· Try a clear liquid diet (broth, tea, or water for as long as directed by your caregiver). You may then gradually begin a low-fiber diet as tolerated.  °A low-fiber diet is a diet with less than 10 grams of fiber. Choose the foods below to reduce fiber in the diet: °· White breads, cereals, rice, and pasta. °· Cooked fruits and vegetables or soft fresh fruits and vegetables without the skin. °· Ground or  well-cooked tender beef, ham, veal, lamb, pork, or poultry. °· Eggs and seafood. °· After your diverticulitis symptoms have improved, your caregiver may put you on a high-fiber diet. A high-fiber diet includes 14 grams of fiber for every 1000 calories consumed. For a standard 2000 calorie diet, you would need 28 grams of fiber. Follow these diet guidelines to help you increase the fiber in your diet. It is important to slowly increase the amount fiber in your diet to avoid gas, constipation, and bloating. °· Choose whole-grain breads, cereals, pasta, and brown rice. °· Choose fresh fruits and vegetables with the skin on. Do not overcook vegetables because the more vegetables are cooked, the more fiber is lost. °· Choose more nuts, seeds, legumes, dried peas, beans, and lentils. °· Look for food products that have greater than 3 grams of fiber per serving on the Nutrition Facts label. °· Take all medicine as directed by your caregiver. °· If your caregiver has given you a follow-up appointment, it is very important that you go. Not going could result in lasting (chronic) or permanent injury, pain, and disability. If there is any problem keeping the appointment, call to reschedule. °SEEK MEDICAL CARE IF:  °· Your pain does not improve. °· You have a hard time advancing your diet beyond clear liquids. °· Your bowel movements do not return to normal. °SEEK IMMEDIATE MEDICAL CARE IF:  °· Your pain becomes worse. °· You have an oral temperature above 102° F (38.9° C), not controlled by medicine. °· You have repeated vomiting. °· You have bloody or black, tarry stools. °·   Symptoms that brought you to your caregiver become worse or are not getting better. °MAKE SURE YOU:  °· Understand these instructions. °· Will watch your condition. °· Will get help right away if you are not doing well or get worse. °Document Released: 02/18/2005 Document Revised: 08/03/2011 Document Reviewed: 06/16/2010 °[employer]® Patient Information  ©2014 [employer]. ° °

## 2012-11-26 ENCOUNTER — Ambulatory Visit (INDEPENDENT_AMBULATORY_CARE_PROVIDER_SITE_OTHER): Payer: Medicare Other | Admitting: Family Medicine

## 2012-11-26 VITALS — BP 128/88 | HR 72 | Temp 97.7°F | Resp 16 | Ht 66.0 in | Wt 152.0 lb

## 2012-11-26 DIAGNOSIS — K5732 Diverticulitis of large intestine without perforation or abscess without bleeding: Secondary | ICD-10-CM

## 2012-11-26 LAB — POCT CBC
Granulocyte percent: 73.4 %G (ref 37–80)
MCH, POC: 29.3 pg (ref 27–31.2)
MID (cbc): 0.5 (ref 0–0.9)
MPV: 8.2 fL (ref 0–99.8)
POC MID %: 6.4 %M (ref 0–12)
Platelet Count, POC: 298 10*3/uL (ref 142–424)
RBC: 4.75 M/uL (ref 4.69–6.13)
WBC: 7.2 10*3/uL (ref 4.6–10.2)

## 2012-11-26 LAB — COMPREHENSIVE METABOLIC PANEL
ALT: 51 U/L (ref 0–53)
AST: 57 U/L — ABNORMAL HIGH (ref 0–37)
Calcium: 9.7 mg/dL (ref 8.4–10.5)
Chloride: 103 mEq/L (ref 96–112)
Creat: 1.15 mg/dL (ref 0.50–1.35)

## 2012-11-26 MED ORDER — CEFTRIAXONE SODIUM 1 G IJ SOLR
1.0000 g | INTRAMUSCULAR | Status: DC
  Administered 2012-11-26: 1 g via INTRAMUSCULAR

## 2012-11-26 NOTE — Patient Instructions (Addendum)
Please let us know how you are doing- if you are not better in the next day or so please give me a call or come back in.

## 2012-11-26 NOTE — Progress Notes (Addendum)
[facility] [address] [place] [address] [phone]  Date:  11/26/2012   Name:  Evan Lopez   DOB:  02-23-1941   MRN:  [id number]  PCP:  Tally Due, MD    Chief Complaint: Diverticulitis   History of Present Illness:  Evan Lopez is a 72 y.o. very pleasant male patient who presents with the following:  He is here today to follow- up from an episode of diverticuliits. He was here on 6/28 and passed out in the waiting room, he was then transferred to the ED and was treated with IV abx and was allowed to go home.   He was then here on 11/22/12 with recurrent pain, was treated with a shot of rocephin.  He felt better after his shot, but over the last 48 hours he has noted some LLQ tenderness again.  He became concerned and come in for a recheck today.   No fever or chills.   He is eating some- yesterday for dinner he had chicken, beans and potato salad.  His bowels are still not quite normal- "a little bit loose," stools looked dark yesterday but not black or tarry, he has not noted any blood. He had one stool yesterday.   He is still taking the oral abx - he was given a 14 day supply of cipro and a 10 day supply of flagyl per the ER.    On 6/28 her ER CT scan showed: IMPRESSION:  1. Focal mesenteric and colonic inflammation at the junction of  the descending and sigmoid colon, favoring acute diverticulitis.  2. Nonobstructive left nephrolithiasis.    Patient Active Problem List   Diagnosis Date Noted  . Chest pain 05/13/2012  . Syncope 05/13/2012  . Hyperlipemia 06/24/2011  . 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 GERD (gastroesophageal reflux disease) 06/24/2011  . 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 NONSPECIFIC ABNORMAL FINDING IN STOOL CONTENTS 02/04/2009    Past Medical History  Diagnosis Date  . GERD (gastroesophageal reflux disease)   . Hyperlipidemia   . DIVERTICULOSIS OF COLON   . Nonspecific abnormal finding  in stool contents   . Kidney calculi     Past Surgical History  Procedure Laterality Date  . Appendectomy      History  Substance Use Topics  . Smoking status: Never Smoker   . Smokeless tobacco: Not on file  . Alcohol Use: No    Family History  Problem Relation Age of Onset  . Cancer Mother   . Alcohol abuse Father     No Known Allergies  Medication list has been reviewed and updated.  Current Outpatient Prescriptions on File Prior to Visit  Medication Sig Dispense Refill  . aspirin 81 MG tablet Take 81 mg by mouth daily.      . ciprofloxacin (CIPRO) 500 MG tablet Take 1 tablet (500 mg total) by mouth 2 (two) times daily.  28 tablet  0  . CRESTOR 40 MG tablet TAKE 1 TABLET (40 MG TOTAL) BY MOUTH DAILY.  30 tablet  2  . HYDROcodone-acetaminophen (NORCO/VICODIN) 5-325 MG per tablet Take 1 tablet by mouth every 6 (six) hours as needed for pain.  30 tablet  0  . metroNIDAZOLE (FLAGYL) 500 MG tablet Take 1 tablet (500 mg total) by mouth 3 (three) times daily.  30 tablet  0  . metroNIDAZOLE (METROGEL) 1 % gel Apply topically daily.  45 g  1  .  omeprazole (PRILOSEC) 20 MG capsule Take 20 mg by mouth 2 (two) times daily.      . ondansetron (ZOFRAN ODT) 8 MG disintegrating tablet Take 1 tablet (8 mg total) by mouth every 8 (eight) hours as needed for nausea.  10 tablet  0  . oxyCODONE-acetaminophen (PERCOCET/ROXICET) 5-325 MG per tablet Take 1 or 2 po Q 6hrs for pain  30 tablet  0   No current facility-administered medications on file prior to visit.    Review of Systems:  As per HPI- otherwise negative.   Physical Examination: Filed Vitals:   11/26/12 0911  BP: 128/88  Pulse: 72  Temp: 97.7 F (36.5 C)  Resp: 16   Filed Vitals:   11/26/12 0911  Height: 5\' 6"  (1.676 m)  Weight: 152 lb (68.947 kg)   Body mass index is 24.55 kg/(m^2). Ideal Body Weight: Weight in (lb) to have BMI = 25: 154.6  GEN: WDWN, NAD, Non-toxic, A & O x 3, looks well HEENT: Atraumatic,  Normocephalic. Neck supple. No masses, No LAD. Ears and Nose: No external deformity. CV: RRR, No M/G/R. No JVD. No thrill. No extra heart sounds. PULM: CTA B, no wheezes, crackles, rhonchi. No retractions. No resp. distress. No accessory muscle use. ABD: S, ND, +BS. No rebound. No HSM.  Minimal LLQ tenderness.   EXTR: No c/c/e NEURO Normal gait.  PSYCH: Normally interactive. Conversant. Not depressed or anxious appearing.  Calm demeanor.  Rectal: normal exam, small external hemorrhoids.  No apparent bleeding  Results for orders placed in visit on 11/26/12  IFOBT (OCCULT BLOOD)      Result Value Range   IFOBT Negative    POCT CBC      Result Value Range   WBC 7.2  4.6 - 10.2 K/uL   Lymph, poc 1.5  0.6 - 3.4   POC LYMPH PERCENT 20.2  10 - 50 %L   MID (cbc) 0.5  0 - 0.9   POC MID % 6.4  0 - 12 %M   POC Granulocyte 5.3  2 - 6.9   Granulocyte percent 73.4  37 - 80 %G   RBC 4.75  4.69 - 6.13 M/uL   Hemoglobin 13.9 (*) 14.1 - 18.1 g/dL   HCT, POC 95.6 (*) 21.3 - 53.7 %   MCV 89.0  80 - 97 fL   MCH, POC 29.3  27 - 31.2 pg   MCHC 32.9  31.8 - 35.4 g/dL   RDW, POC 08.6     Platelet Count, POC 298  142 - 424 K/uL   MPV 8.2  0 - 99.8 fL    Assessment and Plan: Diverticulitis of colon without hemorrhage - Plan: IFOBT POC (occult bld, rslt in office), POCT CBC, Comprehensive metabolic panel, cefTRIAXone (ROCEPHIN) injection 1 g  Evan Lopez is here today to recheck diverticulitis diagnosed one week ago.  He is overall better, but noted some mild recurrent pain so he came in for a recheck.  He is still taking his oral abx.  Discussed possible courses of treatment.  At this time his VS and labs are reassuring.  He would like to try another shot of rocephin, but understands that if he gets worse or not better he will need to seek care again.  I will be in touch with him with the rest of his labs   Signed Abbe Amsterdam, MD  Called to check on him 11/27/12: LMOM. His labs look good except for  slight elevation of his AST.  I  will place an order for a recheck in 2 or 3 weeks.  Let me know if not continuing to feel better.

## 2012-11-27 ENCOUNTER — Encounter: Payer: Self-pay | Admitting: Family Medicine

## 2012-11-27 NOTE — Addendum Note (Signed)
Addended by: Abbe Amsterdam C on: 11/27/2012 09:31 AM   Modules accepted: Orders

## 2012-12-05 ENCOUNTER — Encounter: Payer: Self-pay | Admitting: Family Medicine

## 2012-12-08 ENCOUNTER — Encounter: Payer: Self-pay | Admitting: Family Medicine

## 2012-12-26 ENCOUNTER — Ambulatory Visit (INDEPENDENT_AMBULATORY_CARE_PROVIDER_SITE_OTHER): Payer: Medicare Other | Admitting: Family Medicine

## 2012-12-26 VITALS — BP 108/70 | HR 67 | Temp 98.0°F | Resp 16 | Ht 67.0 in | Wt 150.0 lb

## 2012-12-26 DIAGNOSIS — E785 Hyperlipidemia, unspecified: Secondary | ICD-10-CM

## 2012-12-26 DIAGNOSIS — K5732 Diverticulitis of large intestine without perforation or abscess without bleeding: Secondary | ICD-10-CM

## 2012-12-26 DIAGNOSIS — R109 Unspecified abdominal pain: Secondary | ICD-10-CM

## 2012-12-26 DIAGNOSIS — E7800 Pure hypercholesterolemia, unspecified: Secondary | ICD-10-CM

## 2012-12-26 LAB — POCT CBC
Lymph, poc: 2 (ref 0.6–3.4)
MCH, POC: 29.6 pg (ref 27–31.2)
MCHC: 32.9 g/dL (ref 31.8–35.4)
MID (cbc): 0.4 (ref 0–0.9)
MPV: 8.5 fL (ref 0–99.8)
POC LYMPH PERCENT: 32.8 %L (ref 10–50)
POC MID %: 6.2 %M (ref 0–12)
Platelet Count, POC: 218 10*3/uL (ref 142–424)
RDW, POC: 14.2 %
WBC: 6 10*3/uL (ref 4.6–10.2)

## 2012-12-26 MED ORDER — METRONIDAZOLE 500 MG PO TABS: 500.0000 mg | ORAL_TABLET | Freq: Three times a day (TID) | ORAL | Status: DC

## 2012-12-26 MED ORDER — CIPROFLOXACIN HCL 500 MG PO TABS: 500.0000 mg | ORAL_TABLET | Freq: Two times a day (BID) | ORAL | Status: DC

## 2012-12-26 MED ORDER — ONDANSETRON 8 MG PO TBDP: 8.0000 mg | ORAL_TABLET | Freq: Three times a day (TID) | ORAL | Status: DC | PRN

## 2012-12-26 MED ORDER — ATORVASTATIN CALCIUM 40 MG PO TABS: 40.0000 mg | ORAL_TABLET | Freq: Every day | ORAL | Status: DC

## 2012-12-26 NOTE — Progress Notes (Signed)
[facility] [address] [place] [address] [phone]  Date:  12/26/2012   Name:  Evan Lopez   DOB:  03-27-1941   MRN:  [id number]  PCP:  Tally Due, MD    Chief Complaint: Follow-up   History of Present Illness:  Evan Lopez is a 72 y.o. very pleasant male patient who presents with the following:  Here to recheck diverticulitis.  He was here about a month ago after a more severe episode.  He is here today because he is concerned that the diverticulitis is coming back.  He has noted "tinges of pain" in his lower abdomen- usually the left side- for abut the last week.   No nausea or vomiting.  Bowels have been loose a couple of times.  No persistent diarrrhea No fever.   He has been eating ok, but is careful of what he eats right now.     Patient Active Problem List   Diagnosis Date Noted  . Chest pain 05/13/2012  . Syncope 05/13/2012  . Hyperlipemia 06/24/2011  . 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 GERD (gastroesophageal reflux disease) 06/24/2011  . 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 NONSPECIFIC ABNORMAL FINDING IN STOOL CONTENTS 02/04/2009    Past Medical History  Diagnosis Date  . GERD (gastroesophageal reflux disease)   . Hyperlipidemia   . DIVERTICULOSIS OF COLON   . Nonspecific abnormal finding in stool contents   . Kidney calculi     Past Surgical History  Procedure Laterality Date  . Appendectomy      History  Substance Use Topics  . Smoking status: Never Smoker   . Smokeless tobacco: Not on file  . Alcohol Use: No    Family History  Problem Relation Age of Onset  . Cancer Mother   . Alcohol abuse Father     No Known Allergies  Medication list has been reviewed and updated.  Current Outpatient Prescriptions on File Prior to Visit  Medication Sig Dispense Refill  . aspirin 81 MG tablet Take 81 mg by mouth daily.      . CRESTOR 40 MG tablet TAKE 1 TABLET (40 MG TOTAL) BY MOUTH  DAILY.  30 tablet  2  . omeprazole (PRILOSEC) 20 MG capsule Take 20 mg by mouth 2 (two) times daily.      . ondansetron (ZOFRAN ODT) 8 MG disintegrating tablet Take 1 tablet (8 mg total) by mouth every 8 (eight) hours as needed for nausea.  10 tablet  0  . ciprofloxacin (CIPRO) 500 MG tablet Take 1 tablet (500 mg total) by mouth 2 (two) times daily.  28 tablet  0  . HYDROcodone-acetaminophen (NORCO/VICODIN) 5-325 MG per tablet Take 1 tablet by mouth every 6 (six) hours as needed for pain.  30 tablet  0  . metroNIDAZOLE (FLAGYL) 500 MG tablet Take 1 tablet (500 mg total) by mouth 3 (three) times daily.  30 tablet  0  . metroNIDAZOLE (METROGEL) 1 % gel Apply topically daily.  45 g  1  . oxyCODONE-acetaminophen (PERCOCET/ROXICET) 5-325 MG per tablet Take 1 or 2 po Q 6hrs for pain  30 tablet  0   Current Facility-Administered Medications on File Prior to Visit  Medication Dose Route Frequency Provider Last Rate Last Dose  . cefTRIAXone (ROCEPHIN) injection 1 g  1 g Intramuscular Q24H Gwenlyn Found Sybel Standish, MD   1 g at 11/26/12 1046    Review of Systems:  As  per HPI- otherwise negative.   Physical Examination: Filed Vitals:   12/26/12 1506  BP: 108/70  Pulse: 67  Temp: 98 F (36.7 C)  Resp: 16   Filed Vitals:   12/26/12 1506  Height: 5\' 7"  (1.702 m)  Weight: 150 lb (68.04 kg)   Body mass index is 23.49 kg/(m^2). Ideal Body Weight: Weight in (lb) to have BMI = 25: 159.3  GEN: WDWN, NAD, Non-toxic, A & O x 3, looks well HEENT: Atraumatic, Normocephalic. Neck supple. No masses, No LAD.  Bilateral TM wnl, oropharynx normal.  PEERL,EOMI.   Ears and Nose: No external deformity. CV: RRR, No M/G/R. No JVD. No thrill. No extra heart sounds. PULM: CTA B, no wheezes, crackles, rhonchi. No retractions. No resp. distress. No accessory muscle use. ABD: S, ND, +BS. No rebound. No HSM.  Minimal tenderness in the left lower abdomen and epigastric areas.   EXTR: No c/c/e NEURO Normal gait.  PSYCH:  Normally interactive. Conversant. Not depressed or anxious appearing.  Calm demeanor.   Results for orders placed in visit on 12/26/12  POCT CBC      Result Value Range   WBC 6.0  4.6 - 10.2 K/uL   Lymph, poc 2.0  0.6 - 3.4   POC LYMPH PERCENT 32.8  10 - 50 %L   MID (cbc) 0.4  0 - 0.9   POC MID % 6.2  0 - 12 %M   POC Granulocyte 3.7  2 - 6.9   Granulocyte percent 61.0  37 - 80 %G   RBC 4.90  4.69 - 6.13 M/uL   Hemoglobin 14.5  14.1 - 18.1 g/dL   HCT, POC 16.1  09.6 - 53.7 %   MCV 90.0  80 - 97 fL   MCH, POC 29.6  27 - 31.2 pg   MCHC 32.9  31.8 - 35.4 g/dL   RDW, POC 04.5     Platelet Count, POC 218  142 - 424 K/uL   MPV 8.5  0 - 99.8 fL    Assessment and Plan: Diverticulitis of colon (without mention of hemorrhage) - Plan: ciprofloxacin (CIPRO) 500 MG tablet, metroNIDAZOLE (FLAGYL) 500 MG tablet, ondansetron (ZOFRAN ODT) 8 MG disintegrating tablet, ciprofloxacin (CIPRO) 500 MG tablet, metroNIDAZOLE (FLAGYL) 500 MG tablet  Abdominal  pain, other specified site - Plan: Comprehensive metabolic panel, POCT CBC, Amylase, Lipase, EKG 12-Lead  High cholesterol - Plan: atorvastatin (LIPITOR) 40 MG tablet  Checked EKG today- Qtc is normal at 394 msc.  Ok to use cipro and zofran, which he has used in the past without incident.  Will rx cipro and flagyl to use for possible mild diverticulitis.  zofran prn nausea He would like to change to lipitor in hopes of saving some money on meds. Will try 40 mg a day, recheck in 6 weeks for FLP.   Signed Abbe Amsterdam, MD

## 2012-12-26 NOTE — Patient Instructions (Addendum)
Let us know if you are not feeling better.  I will be in touch with the rest of your labs.  We are going to try lipitor in place of your crestor.  Let's check a fasting lipid panel in about 6 weeks

## 2012-12-27 ENCOUNTER — Encounter: Payer: Self-pay | Admitting: Family Medicine

## 2012-12-27 ENCOUNTER — Telehealth: Payer: Self-pay | Admitting: *Deleted

## 2012-12-27 LAB — COMPREHENSIVE METABOLIC PANEL
ALT: 19 U/L (ref 0–53)
BUN: 9 mg/dL (ref 6–23)
CO2: 29 mEq/L (ref 19–32)
Creat: 1.02 mg/dL (ref 0.50–1.35)
Glucose, Bld: 72 mg/dL (ref 70–99)
Total Bilirubin: 0.6 mg/dL (ref 0.3–1.2)

## 2012-12-27 NOTE — Telephone Encounter (Signed)
As per Dr. Patsy Lager, called patient to see how he was doing.  He said he was feeling better.  I told him we were mailing him his lab results and his liver function was back to normal.  Angie Tiffanie Blassingame, CMA.

## 2013-05-15 ENCOUNTER — Ambulatory Visit (INDEPENDENT_AMBULATORY_CARE_PROVIDER_SITE_OTHER): Payer: Medicare Other | Admitting: Family Medicine

## 2013-05-15 VITALS — BP 120/80 | HR 69 | Temp 98.4°F | Resp 16 | Ht 67.0 in | Wt 152.2 lb

## 2013-05-15 DIAGNOSIS — H6120 Impacted cerumen, unspecified ear: Secondary | ICD-10-CM

## 2013-05-15 DIAGNOSIS — H6122 Impacted cerumen, left ear: Secondary | ICD-10-CM

## 2013-05-15 NOTE — Patient Instructions (Signed)
Please come and see me tomorrow am for ear cleaning.    Please FAST TRACK to see Dr. Salena Saner tomorrow  DO NOT CHARGE a co- pay

## 2013-05-15 NOTE — Progress Notes (Signed)
[facility] [address] [place] [address] [phone]  Date:  05/15/2013   Name:  Evan Lopez   DOB:  04/08/1941   MRN:  [id number]  PCP:  Tally Due, MD    Chief Complaint: Otalgia   History of Present Illness:  Evan Lopez is a 72 y.o. very pleasant male patient who presents with the following:  He is here today with pain in his left ear- he notes "a dull pain" for a couple of weeks.  He does not feel that his hearing is changed.   He does not noted any buzzing or ringing in his ear.   His right ear feels fine.   He has noted some sinus congestion in the am- otherwise no runny nose, sneezing, cough, or other sympotms.   He cannot recall having this issue in the past.    He has had vertigo years ago- no other history of ear issues. No current vertigo No CP- he had a normal heart cath in January of this year  Patient Active Problem List   Diagnosis Date Noted  . Chest pain 05/13/2012  . Syncope 05/13/2012  . Hyperlipemia 06/24/2011  . 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 GERD (gastroesophageal reflux disease) 06/24/2011  . 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 NONSPECIFIC ABNORMAL FINDING IN STOOL CONTENTS 02/04/2009    Past Medical History  Diagnosis Date  . GERD (gastroesophageal reflux disease)   . Hyperlipidemia   . DIVERTICULOSIS OF COLON   . Nonspecific abnormal finding in stool contents   . Kidney calculi     Past Surgical History  Procedure Laterality Date  . Appendectomy      History  Substance Use Topics  . Smoking status: Never Smoker   . Smokeless tobacco: Not on file  . Alcohol Use: No    Family History  Problem Relation Age of Onset  . Cancer Mother   . Alcohol abuse Father     No Known Allergies  Medication list has been reviewed and updated.  Current Outpatient Prescriptions on File Prior to Visit  Medication Sig Dispense Refill  . aspirin 81 MG tablet Take 81 mg by mouth  daily.      Marland Kitchen omeprazole (PRILOSEC) 20 MG capsule Take 20 mg by mouth 2 (two) times daily.      Marland Kitchen atorvastatin (LIPITOR) 40 MG tablet Take 1 tablet (40 mg total) by mouth daily.  90 tablet  3  . ciprofloxacin (CIPRO) 500 MG tablet Take 1 tablet (500 mg total) by mouth 2 (two) times daily.  28 tablet  0  . ciprofloxacin (CIPRO) 500 MG tablet Take 1 tablet (500 mg total) by mouth 2 (two) times daily.  20 tablet  0  . HYDROcodone-acetaminophen (NORCO/VICODIN) 5-325 MG per tablet Take 1 tablet by mouth every 6 (six) hours as needed for pain.  30 tablet  0  . metroNIDAZOLE (FLAGYL) 500 MG tablet Take 1 tablet (500 mg total) by mouth 3 (three) times daily.  30 tablet  0  . metroNIDAZOLE (FLAGYL) 500 MG tablet Take 1 tablet (500 mg total) by mouth 3 (three) times daily.  30 tablet  0  . metroNIDAZOLE (METROGEL) 1 % gel Apply topically daily.  45 g  1  . ondansetron (ZOFRAN ODT) 8 MG disintegrating tablet Take 1 tablet (8 mg total) by mouth every 8 (eight) hours as needed for nausea.  10 tablet  0  . oxyCODONE-acetaminophen (PERCOCET/ROXICET)  5-325 MG per tablet Take 1 or 2 po Q 6hrs for pain  30 tablet  0   No current facility-administered medications on file prior to visit.    Review of Systems:  As per HPI- otherwise negative.   Physical Examination: Filed Vitals:   05/15/13 0941  BP: 120/80  Pulse: 69  Temp: 98.4 F (36.9 C)  Resp: 16   Filed Vitals:   05/15/13 0941  Height: 5\' 7"  (1.702 m)  Weight: 152 lb 3.2 oz (69.037 kg)   Body mass index is 23.83 kg/(m^2). Ideal Body Weight: Weight in (lb) to have BMI = 25: 159.3  GEN: WDWN, NAD, Non-toxic, A & O x 3 HEENT: Atraumatic, Normocephalic. Neck supple. No masses, No LAD.  Left TM is obscured by a hard wax ball.  Right TM is wnl.  PEERl, EOMI, oropharynx wnl Ears and Nose: No external deformity. CV: RRR, No M/G/R. No JVD. No thrill. No extra heart sounds. PULM: CTA B, no wheezes, crackles, rhonchi. No retractions. No resp.  distress. No accessory muscle use. EXTR: No c/c/e NEURO Normal gait.  PSYCH: Normally interactive. Conversant. Not depressed or anxious appearing.  Calm demeanor.   Placed colace and irrigated ear X3 sessions.  Some small flecks of wax removed but main cerumen impaction was not removed.   Assessment and Plan: Cerumen impaction, left  Despite much irrigation and colace, the cerumen impaction could not be resolved.  Placed cipro/ hydrocortisone drops and colace with a cotton ball.  He will return in the am for another attempt.    Signed Abbe Amsterdam, MD

## 2013-05-16 ENCOUNTER — Ambulatory Visit (INDEPENDENT_AMBULATORY_CARE_PROVIDER_SITE_OTHER): Payer: Medicare Other | Admitting: Family Medicine

## 2013-05-16 ENCOUNTER — Encounter: Payer: Self-pay | Admitting: Family Medicine

## 2013-05-16 VITALS — BP 120/84 | HR 65 | Temp 98.1°F | Resp 18 | Ht 67.0 in | Wt 152.0 lb

## 2013-05-16 DIAGNOSIS — Z13220 Encounter for screening for lipoid disorders: Secondary | ICD-10-CM

## 2013-05-16 DIAGNOSIS — H6120 Impacted cerumen, unspecified ear: Secondary | ICD-10-CM

## 2013-05-16 DIAGNOSIS — H6122 Impacted cerumen, left ear: Secondary | ICD-10-CM

## 2013-05-16 LAB — LIPID PANEL
LDL Cholesterol: 52 mg/dL (ref 0–99)
Total CHOL/HDL Ratio: 2.3 Ratio
VLDL: 20 mg/dL (ref 0–40)

## 2013-05-16 NOTE — Progress Notes (Signed)
[facility] [address] [place] [address] [phone]  Date:  05/16/2013   Name:  Evan Lopez   DOB:  [birth year]   MRN:  [id number]  PCP:  Tally Due, MD    Chief Complaint: Cerumen Impaction   History of Present Illness:  Evan Lopez is a 72 y.o. very pleasant male patient who presents with the following:  See OV from yesterday- he is here to recheck cerumen impaction right ear.  He would also like a lipid panel- he has not eaten today.  His ear is not painful but still feels uncomfortable  Patient Active Problem List   Diagnosis Date Noted  . Chest pain 05/13/2012  . Syncope 05/13/2012  . Hyperlipemia 06/24/2011  . 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 GERD (gastroesophageal reflux disease) 06/24/2011  . 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 NONSPECIFIC ABNORMAL FINDING IN STOOL CONTENTS 02/04/2009    Past Medical History  Diagnosis Date  . GERD (gastroesophageal reflux disease)   . Hyperlipidemia   . DIVERTICULOSIS OF COLON   . Nonspecific abnormal finding in stool contents   . Kidney calculi     Past Surgical History  Procedure Laterality Date  . Appendectomy      History  Substance Use Topics  . Smoking status: Never Smoker   . Smokeless tobacco: Not on file  . Alcohol Use: No    Family History  Problem Relation Age of Onset  . Cancer Mother   . Alcohol abuse Father     No Known Allergies  Medication list has been reviewed and updated.  Current Outpatient Prescriptions on File Prior to Visit  Medication Sig Dispense Refill  . aspirin 81 MG tablet Take 81 mg by mouth daily.      Marland Kitchen atorvastatin (LIPITOR) 40 MG tablet Take 1 tablet (40 mg total) by mouth daily.  90 tablet  3  . ciprofloxacin (CIPRO) 500 MG tablet Take 1 tablet (500 mg total) by mouth 2 (two) times daily.  28 tablet  0  . ciprofloxacin (CIPRO) 500 MG tablet Take 1 tablet (500 mg total) by mouth 2 (two) times daily.  20 tablet   0  . HYDROcodone-acetaminophen (NORCO/VICODIN) 5-325 MG per tablet Take 1 tablet by mouth every 6 (six) hours as needed for pain.  30 tablet  0  . metroNIDAZOLE (FLAGYL) 500 MG tablet Take 1 tablet (500 mg total) by mouth 3 (three) times daily.  30 tablet  0  . metroNIDAZOLE (FLAGYL) 500 MG tablet Take 1 tablet (500 mg total) by mouth 3 (three) times daily.  30 tablet  0  . metroNIDAZOLE (METROGEL) 1 % gel Apply topically daily.  45 g  1  . omeprazole (PRILOSEC) 20 MG capsule Take 20 mg by mouth 2 (two) times daily.      . ondansetron (ZOFRAN ODT) 8 MG disintegrating tablet Take 1 tablet (8 mg total) by mouth every 8 (eight) hours as needed for nausea.  10 tablet  0  . oxyCODONE-acetaminophen (PERCOCET/ROXICET) 5-325 MG per tablet Take 1 or 2 po Q 6hrs for pain  30 tablet  0   No current facility-administered medications on file prior to visit.    Review of Systems:  As per HPI- otherwise negative.   Physical Examination: Filed Vitals:   05/16/13 0817  BP: 120/84  Pulse: 65  Temp: 98.1 F (36.7 C)  Resp: 18   Filed Vitals:   05/16/13 0817  Height: 5\' 7"  (1.702 m)  Weight: 152 lb (68.947 kg)   Body mass index is 23.8 kg/(m^2). Ideal Body Weight: Weight in (lb) to have BMI = 25: 159.3   GEN: WDWN, NAD, Non-toxic, Alert & Oriented x 3 Persistent cerumen impaction left ear.  Irrigated with warm water and removed easily.  TM and ear canal normal after cerumen removal  HEENT: Atraumatic, Normocephalic.  Ears and Nose: No external deformity. EXTR: No clubbing/cyanosis/edema NEURO: Normal gait.  PSYCH: Normally interactive. Conversant. Not depressed or anxious appearing.  Calm demeanor.    Assessment and Plan: Screening for hyperlipidemia - Plan: Lipid panel  Cerumen impaction, left  Await labs Cerumen impaction resolved after overnight treatment with colae.     Signed Abbe Amsterdam, MD

## 2013-05-16 NOTE — Patient Instructions (Signed)
I will be in touch regarding your cholesterol.  Let me know if you have any trouble with your ears!

## 2013-11-30 ENCOUNTER — Telehealth: Payer: Self-pay

## 2013-11-30 NOTE — Telephone Encounter (Signed)
Pt needs another referral to derm because his new ins will no longer cover the derm we referred him to last year. Advised that he call [employer] and find out which derm is in his network and to call us back

## 2013-11-30 NOTE — Telephone Encounter (Signed)
Pt is needing to talk with dr copland about a dermatology referral

## 2014-01-03 ENCOUNTER — Telehealth: Payer: Self-pay

## 2014-01-03 DIAGNOSIS — L719 Rosacea, unspecified: Secondary | ICD-10-CM

## 2014-01-03 NOTE — Telephone Encounter (Signed)
Pended referral. Please advise.

## 2014-01-03 NOTE — Telephone Encounter (Signed)
Patient called to get a [facility] referral authorization for Dr. Allyson Sabal dermatology and wants a referral for an ov to see him as well. Patient see's Dr. Lorelei Pont. Its for rosacia.   Any quesitons call: [phone]

## 2014-01-05 ENCOUNTER — Ambulatory Visit (INDEPENDENT_AMBULATORY_CARE_PROVIDER_SITE_OTHER): Payer: Commercial Managed Care - HMO | Admitting: Internal Medicine

## 2014-01-05 VITALS — BP 110/72 | HR 69 | Temp 97.9°F | Resp 18 | Ht 66.5 in | Wt 149.0 lb

## 2014-01-05 DIAGNOSIS — R109 Unspecified abdominal pain: Secondary | ICD-10-CM

## 2014-01-05 DIAGNOSIS — E785 Hyperlipidemia, unspecified: Secondary | ICD-10-CM

## 2014-01-05 DIAGNOSIS — K219 Gastro-esophageal reflux disease without esophagitis: Secondary | ICD-10-CM

## 2014-01-05 DIAGNOSIS — K5730 Diverticulosis of large intestine without perforation or abscess without bleeding: Secondary | ICD-10-CM

## 2014-01-05 DIAGNOSIS — K5732 Diverticulitis of large intestine without perforation or abscess without bleeding: Secondary | ICD-10-CM

## 2014-01-05 LAB — POCT CBC
GRANULOCYTE PERCENT: 75.1 % (ref 37–80)
HCT, POC: 45.5 % (ref 43.5–53.7)
Hemoglobin: 14.9 g/dL (ref 14.1–18.1)
Lymph, poc: 1.5 (ref 0.6–3.4)
MCH: 29.1 pg (ref 27–31.2)
MCHC: 32.8 g/dL (ref 31.8–35.4)
MCV: 88.7 fL (ref 80–97)
MID (CBC): 0.6 (ref 0–0.9)
MPV: 7.2 fL (ref 0–99.8)
PLATELET COUNT, POC: 217 10*3/uL (ref 142–424)
POC GRANULOCYTE: 6.5 (ref 2–6.9)
POC LYMPH PERCENT: 17.6 %L (ref 10–50)
POC MID %: 7.3 % (ref 0–12)
RBC: 5.12 M/uL (ref 4.69–6.13)
RDW, POC: 13.9 %
WBC: 8.6 10*3/uL (ref 4.6–10.2)

## 2014-01-05 LAB — POCT UA - MICROSCOPIC ONLY
Bacteria, U Microscopic: NEGATIVE
CASTS, UR, LPF, POC: NEGATIVE
CRYSTALS, UR, HPF, POC: NEGATIVE
Mucus, UA: NEGATIVE
RBC, URINE, MICROSCOPIC: NEGATIVE
Yeast, UA: NEGATIVE

## 2014-01-05 LAB — COMPREHENSIVE METABOLIC PANEL
ALBUMIN: 4.5 g/dL (ref 3.5–5.2)
ALK PHOS: 103 U/L (ref 39–117)
ALT: 14 U/L (ref 0–53)
AST: 17 U/L (ref 0–37)
BUN: 14 mg/dL (ref 6–23)
CO2: 30 mEq/L (ref 19–32)
Calcium: 9.8 mg/dL (ref 8.4–10.5)
Chloride: 102 mEq/L (ref 96–112)
Creat: 0.97 mg/dL (ref 0.50–1.35)
Glucose, Bld: 106 mg/dL — ABNORMAL HIGH (ref 70–99)
POTASSIUM: 4.2 meq/L (ref 3.5–5.3)
SODIUM: 140 meq/L (ref 135–145)
TOTAL PROTEIN: 7.6 g/dL (ref 6.0–8.3)
Total Bilirubin: 1.5 mg/dL — ABNORMAL HIGH (ref 0.2–1.2)

## 2014-01-05 LAB — POCT URINALYSIS DIPSTICK
Bilirubin, UA: NEGATIVE
GLUCOSE UA: NEGATIVE
Ketones, UA: NEGATIVE
Leukocytes, UA: NEGATIVE
NITRITE UA: NEGATIVE
PH UA: 7
Protein, UA: NEGATIVE
RBC UA: NEGATIVE
Spec Grav, UA: 1.015
UROBILINOGEN UA: 0.2

## 2014-01-05 LAB — LIPID PANEL
Cholesterol: 210 mg/dL — ABNORMAL HIGH (ref 0–200)
HDL: 60 mg/dL (ref >39)
LDL CALC: 128 mg/dL — AB (ref 0–99)
Total CHOL/HDL Ratio: 3.5 Ratio
Triglycerides: 108 mg/dL (ref <150)
VLDL: 22 mg/dL (ref 0–40)

## 2014-01-05 MED ORDER — METRONIDAZOLE 500 MG PO TABS: 500.0000 mg | ORAL_TABLET | Freq: Two times a day (BID) | ORAL | Status: DC

## 2014-01-05 MED ORDER — CIPROFLOXACIN HCL 500 MG PO TABS: 500.0000 mg | ORAL_TABLET | Freq: Two times a day (BID) | ORAL | Status: DC

## 2014-01-05 NOTE — Progress Notes (Addendum)
Subjective:    Patient ID: Evan Lopez, male    DOB: December 15, 1940, 73 y.o.   MRN: [id number]  HPI Chief Complaint  Patient presents with  . Diverticulitis    stomach pain started yesterday    This chart was scribed for Tami Lin, MD by Thea Alken, ED Scribe. This patient was seen in room 10 and the patient's care was started at 9:54 AM.  HPI Comments: Evan Lopez is a 73 y.o. male with h/o IBS who presents to the [facility] complaining of worsening, waxing and waning lower abdominal pain onset 1 day. Pt reports pain began 1 day ago after work and has been gradually worsening since. Pt describes pain as sharp but is also dull at times. Pt reports associate dysuria and mild diarrhea.  Pt denies fever, blood in stool, appetite change. He has had intermittent pain for the last 2 weeks that seemed to be building up to his present pain. He has had this experience in the past on 2 different occasions which lead to the diagnosis of diverticulitis. He had a colonoscopy recently which confirmed diverticular.   Past Medical History  Diagnosis Date  . GERD (gastroesophageal reflux disease)   . Hyperlipidemia----note recent lab work December 2014 was normal /he has since discontinued medication and has missed his followup blood work which we will do today    . DIVERTICULOSIS OF COLON   . Nonspecific abnormal finding in stool contents   . Kidney calculi    Past Surgical History  Procedure Laterality Date  . Appendectomy     Prior to Admission medications   Medication Sig Start Date End Date Taking? Authorizing Provider  aspirin 81 MG tablet Take 81 mg by mouth daily.   Yes Historical Provider, MD  omeprazole (PRILOSEC) 20 MG capsule Take 20 mg by mouth 2 (two) times daily.   Yes Historical Provider, MD  atorvastatin (LIPITOR) 40 MG tablet Take 1 tablet (40 mg total) by mouth daily. 12/26/12   Gay Filler Copland, MD  ciprofloxacin (CIPRO) 500 MG tablet Take 1  tablet (500 mg total) by mouth 2 (two) times daily. 12/26/12   Gay Filler Copland, MD  ciprofloxacin (CIPRO) 500 MG tablet Take 1 tablet (500 mg total) by mouth 2 (two) times daily. 12/26/12   Darreld Mclean, MD  HYDROcodone-acetaminophen (NORCO/VICODIN) 5-325 MG per tablet Take 1 tablet by mouth every 6 (six) hours as needed for pain. 11/22/12   Orma Flaming, MD  metroNIDAZOLE (FLAGYL) 500 MG tablet Take 1 tablet (500 mg total) by mouth 3 (three) times daily. 12/26/12   Gay Filler Copland, MD  metroNIDAZOLE (FLAGYL) 500 MG tablet Take 1 tablet (500 mg total) by mouth 3 (three) times daily. 12/26/12   Gay Filler Copland, MD  metroNIDAZOLE (METROGEL) 1 % gel Apply topically daily. 08/31/12   Gay Filler Copland, MD  ondansetron (ZOFRAN ODT) 8 MG disintegrating tablet Take 1 tablet (8 mg total) by mouth every 8 (eight) hours as needed for nausea. 12/26/12   Darreld Mclean, MD  oxyCODONE-acetaminophen (PERCOCET/ROXICET) 5-325 MG per tablet Take 1 or 2 po Q 6hrs for pain 11/19/12   Janice Norrie, MD   Review of Systems  Constitutional: Negative for fever, chills and appetite change.  Gastrointestinal: Positive for diarrhea. Negative for blood in stool.  Genitourinary: Positive for dysuria.   no weight loss No fatigue or change in activity  Objective:   Physical Exam  Nursing note and vitals  reviewed. Constitutional: He is oriented to person, place, and time. He appears well-developed and well-nourished. No distress.  HENT:  Head: Normocephalic and atraumatic.  Eyes: Conjunctivae and EOM are normal.  Neck: Neck supple.  Cardiovascular: Normal rate.   Pulmonary/Chest: Effort normal.  Abdominal: Bowel sounds are normal. He exhibits no mass. There is no hepatosplenomegaly, splenomegaly or hepatomegaly. There is tenderness in the right lower quadrant and left lower quadrant. There is rebound ( mild on left). There is no guarding.  Musculoskeletal: Normal range of motion.  Neurological: He is alert and oriented  to person, place, and time.  Skin: Skin is warm and dry.  Psychiatric: He has a normal mood and affect. His behavior is normal.   Results for orders placed in visit on 01/05/14  POCT CBC      Result Value Ref Range   WBC 8.6  4.6 - 10.2 K/uL   Lymph, poc 1.5  0.6 - 3.4   POC LYMPH PERCENT 17.6  10 - 50 %L   MID (cbc) 0.6  0 - 0.9   POC MID % 7.3  0 - 12 %M   POC Granulocyte 6.5  2 - 6.9   Granulocyte percent 75.1  37 - 80 %G   RBC 5.12  4.69 - 6.13 M/uL   Hemoglobin 14.9  14.1 - 18.1 g/dL   HCT, POC 45.5  43.5 - 53.7 %   MCV 88.7  80 - 97 fL   MCH, POC 29.1  27 - 31.2 pg   MCHC 32.8  31.8 - 35.4 g/dL   RDW, POC 13.9     Platelet Count, POC 217  142 - 424 K/uL   MPV 7.2  0 - 99.8 fL  POCT URINALYSIS DIPSTICK      Result Value Ref Range   Color, UA yellow     Clarity, UA clear     Glucose, UA neg     Bilirubin, UA neg     Ketones, UA neg     Spec Grav, UA 1.015     Blood, UA neg     pH, UA 7.0     Protein, UA neg     Urobilinogen, UA 0.2     Nitrite, UA neg     Leukocytes, UA Negative    POCT UA - MICROSCOPIC ONLY      Result Value Ref Range   WBC, Ur, HPF, POC 0-1     RBC, urine, microscopic neg     Bacteria, U Microscopic neg     Mucus, UA neg     Epithelial cells, urine per micros 0-1     Crystals, Ur, HPF, POC neg     Casts, Ur, LPF, POC neg     Yeast, UA neg      Assessment & Plan:   DIVERTICULOSIS OF COLON - Plan: POCT CBC, POCT urinalysis dipstick, POCT UA - Microscopic Only  Abdominal pain, other specified site  Gastroesophageal reflux disease without esophagitis - Plan: POCT CBC, POCT urinalysis dipstick, POCT UA - Microscopic Only  Hyperlipemia - Plan: Comprehensive metabolic panel, Lipid panel  Diverticulitis of colon (without mention of hemorrhage) - Plan: metroNIDAZOLE (FLAGYL) 500 MG tablet, ciprofloxacin (CIPRO) 500 MG tablet   Meds ordered this encounter  Medications  . metroNIDAZOLE (FLAGYL) 500 MG tablet    Sig: Take 1 tablet (500 mg  total) by mouth 2 (two) times daily.    Dispense:  20 tablet    Refill:  0  . ciprofloxacin (  CIPRO) 500 MG tablet    Sig: Take 1 tablet (500 mg total) by mouth 2 (two) times daily.    Dispense:  20 tablet    Refill:  0   notify results If not responding we will need to proceed with imaging  I have completed the patient encounter in its entirety as documented by the scribe, with editing by me where necessary. Kenyah Luba P. Laney Pastor, M.D.

## 2014-01-09 ENCOUNTER — Encounter: Payer: Self-pay | Admitting: Internal Medicine

## 2014-02-11 ENCOUNTER — Ambulatory Visit (INDEPENDENT_AMBULATORY_CARE_PROVIDER_SITE_OTHER): Payer: Commercial Managed Care - HMO

## 2014-02-11 ENCOUNTER — Ambulatory Visit (INDEPENDENT_AMBULATORY_CARE_PROVIDER_SITE_OTHER): Payer: Commercial Managed Care - HMO | Admitting: Emergency Medicine

## 2014-02-11 VITALS — BP 128/68 | HR 69 | Temp 97.8°F | Resp 16 | Ht 65.75 in | Wt 148.2 lb

## 2014-02-11 DIAGNOSIS — K5732 Diverticulitis of large intestine without perforation or abscess without bleeding: Secondary | ICD-10-CM

## 2014-02-11 DIAGNOSIS — M25519 Pain in unspecified shoulder: Secondary | ICD-10-CM

## 2014-02-11 DIAGNOSIS — R197 Diarrhea, unspecified: Secondary | ICD-10-CM

## 2014-02-11 DIAGNOSIS — R109 Unspecified abdominal pain: Secondary | ICD-10-CM

## 2014-02-11 DIAGNOSIS — M25512 Pain in left shoulder: Secondary | ICD-10-CM

## 2014-02-11 LAB — POCT URINALYSIS DIPSTICK
BILIRUBIN UA: NEGATIVE
Glucose, UA: NEGATIVE
Ketones, UA: NEGATIVE
Leukocytes, UA: NEGATIVE
NITRITE UA: NEGATIVE
PH UA: 5.5
Protein, UA: NEGATIVE
RBC UA: NEGATIVE
Spec Grav, UA: 1.01
Urobilinogen, UA: 0.2

## 2014-02-11 LAB — POCT CBC
Granulocyte percent: 74.7 %G (ref 37–80)
HCT, POC: 45.6 % (ref 43.5–53.7)
Hemoglobin: 15.1 g/dL (ref 14.1–18.1)
Lymph, poc: 1.7 (ref 0.6–3.4)
MCH: 29.2 pg (ref 27–31.2)
MCHC: 33.2 g/dL (ref 31.8–35.4)
MCV: 88 fL (ref 80–97)
MID (CBC): 0.4 (ref 0–0.9)
MPV: 7.4 fL (ref 0–99.8)
PLATELET COUNT, POC: 207 10*3/uL (ref 142–424)
POC Granulocyte: 6.3 (ref 2–6.9)
POC LYMPH %: 20.8 % (ref 10–50)
POC MID %: 4.5 %M (ref 0–12)
RBC: 5.18 M/uL (ref 4.69–6.13)
RDW, POC: 12.9 %
WBC: 8.4 10*3/uL (ref 4.6–10.2)

## 2014-02-11 LAB — POCT UA - MICROSCOPIC ONLY
BACTERIA, U MICROSCOPIC: NEGATIVE
CASTS, UR, LPF, POC: NEGATIVE
CRYSTALS, UR, HPF, POC: NEGATIVE
MUCUS UA: NEGATIVE
RBC, urine, microscopic: NEGATIVE
WBC, Ur, HPF, POC: NEGATIVE
Yeast, UA: NEGATIVE

## 2014-02-11 MED ORDER — METRONIDAZOLE 500 MG PO TABS: 500.0000 mg | ORAL_TABLET | Freq: Two times a day (BID) | ORAL | Status: DC

## 2014-02-11 MED ORDER — CIPROFLOXACIN HCL 500 MG PO TABS: 500.0000 mg | ORAL_TABLET | Freq: Two times a day (BID) | ORAL | Status: DC

## 2014-02-11 NOTE — Patient Instructions (Signed)

## 2014-02-11 NOTE — Progress Notes (Addendum)
Subjective:    Patient ID: Evan Lopez, male    DOB: Dec 23, 1940, 73 y.o.   MRN: [id number] This chart was scribed for Evan. Everlene Farrier, MD by Steva Colder, ED Scribe. The patient was seen in room 8 at 12:06 PM.   Chief Complaint  Patient presents with  . Diarrhea    x 1 month  . Abdominal Pain    left side   . Shoulder Pain    left x 2 months  . urination retention    HPI Evan Lopez is a 73 y.o. male who presents today complaining of diarrhea onset 1 month. He states that he has had 5-6 episodes since last night. He states that his diverticulosis is bothering him again. He states that he saw Dr. Laney Pastor for it 2 weeks ago and was Rx Abx and it came back after a week of completing the medications. He states that he thinks that the Abx worked but he thinks that it needs to be a longer Rx. He states that he has had 2 prior episodes of his diverticulosis. He states that last time it flared up, he was sent to the ED and he was not kept overnight. He states that he is having associated symptoms of left abdominal pain, left shoulder pain, and urinary retention. He states that his abdomen is sore right now. He states that his last CT scan was 1 year ago. He states that he does not have his appendix.  He states that the shoulder pain has lasted for 2 months. He states that his shoulder is sore to the touch. He denies the pain coming from his neck. He states that when he raises his arm, there is minimal pain to the area. He denies fever and any other associated symptoms. He states that Dr. Amedeo Plenty is his GI doctor and he last had a colonoscopy 1 year ago. He states that he retired in 2012.     Patient Active Problem List   Diagnosis Date Noted  . Chest pain 05/13/2012  . Syncope 05/13/2012  . Hyperlipemia 06/24/2011  . 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 GERD (gastroesophageal reflux disease) 06/24/2011  . 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 NONSPECIFIC ABNORMAL FINDING  IN STOOL CONTENTS 02/04/2009   Past Medical History  Diagnosis Date  . GERD (gastroesophageal reflux disease)   . Hyperlipidemia   . DIVERTICULOSIS OF COLON   . Nonspecific abnormal finding in stool contents   . Kidney calculi    Past Surgical History  Procedure Laterality Date  . Appendectomy     No Known Allergies Prior to Admission medications   Medication Sig Start Date End Date Taking? Authorizing Provider  aspirin 81 MG tablet Take 81 mg by mouth daily.   Yes Historical Provider, MD  atorvastatin (LIPITOR) 40 MG tablet Take 1 tablet (40 mg total) by mouth daily. 12/26/12  Yes Gay Filler Copland, MD  ciprofloxacin (CIPRO) 500 MG tablet Take 1 tablet (500 mg total) by mouth 2 (two) times daily. 01/05/14  Yes Leandrew Koyanagi, MD  HYDROcodone-acetaminophen (NORCO/VICODIN) 5-325 MG per tablet Take 1 tablet by mouth every 6 (six) hours as needed for pain. 11/22/12  Yes Orma Flaming, MD  metroNIDAZOLE (FLAGYL) 500 MG tablet Take 1 tablet (500 mg total) by mouth 2 (two) times daily. 01/05/14  Yes Leandrew Koyanagi, MD  metroNIDAZOLE (METROGEL) 1 % gel Apply topically daily. 08/31/12  Yes Darreld Mclean, MD  omeprazole (Poydras)  20 MG capsule Take 20 mg by mouth 2 (two) times daily.   Yes Historical Provider, MD  ondansetron (ZOFRAN ODT) 8 MG disintegrating tablet Take 1 tablet (8 mg total) by mouth every 8 (eight) hours as needed for nausea. 12/26/12  Yes Darreld Mclean, MD  oxyCODONE-acetaminophen (PERCOCET/ROXICET) 5-325 MG per tablet Take 1 or 2 po Q 6hrs for pain 11/19/12  Yes Janice Norrie, MD      Review of Systems  Constitutional: Negative for fever.  Gastrointestinal: Positive for abdominal pain and diarrhea.  Genitourinary:       Urinary retention  Musculoskeletal: Positive for arthralgias (left shoulder).       Objective:   Physical Exam  Nursing note and vitals reviewed. Constitutional: He is oriented to person, place, and time. He appears well-developed and  well-nourished. No distress.  HENT:  Head: Normocephalic and atraumatic.  Eyes: EOM are normal.  Neck: Neck supple.  Cardiovascular: Normal rate, regular rhythm and normal heart sounds.   Pulmonary/Chest: Effort normal and breath sounds normal. No respiratory distress.  Abdominal: Bowel sounds are normal. He exhibits no distension. There is tenderness.  Tenderness to the RLQ and RUQ and right mid abdomen.   Musculoskeletal: Normal range of motion.  Neurological: He is alert and oriented to person, place, and time.  Skin: Skin is warm and dry.  Psychiatric: He has a normal mood and affect. His behavior is normal.   [facility] reading (PRIMARY) by  Dr. Everlene Lopez there are isolated air-fluid levels right mid abdomen no definite obstruction. 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 Results for orders placed in visit on 02/11/14  POCT CBC      Result Value Ref Range   WBC 8.4  4.6 - 10.2 K/uL   Lymph, poc 1.7  0.6 - 3.4   POC LYMPH PERCENT 20.8  10 - 50 %L   MID (cbc) 0.4  0 - 0.9   POC MID % 4.5  0 - 12 %M   POC Granulocyte 6.3  2 - 6.9   Granulocyte percent 74.7  37 - 80 %G   RBC 5.18  4.69 - 6.13 M/uL   Hemoglobin 15.1  14.1 - 18.1 g/dL   HCT, POC 45.6  43.5 - 53.7 %   MCV 88.0  80 - 97 fL   MCH, POC 29.2  27 - 31.2 pg   MCHC 33.2  31.8 - 35.4 g/dL   RDW, POC 12.9     Platelet Count, POC 207  142 - 424 K/uL   MPV 7.4  0 - 99.8 fL  POCT URINALYSIS DIPSTICK      Result Value Ref Range   Color, UA yellow     Clarity, UA clear     Glucose, UA neg     Bilirubin, UA neg     Ketones, UA neg     Spec Grav, UA 1.010     Blood, UA neg     pH, UA 5.5     Protein, UA neg     Urobilinogen, UA 0.2     Nitrite, UA neg     Leukocytes, UA Negative    POCT UA - MICROSCOPIC ONLY      Result Value Ref Range   WBC, Ur, HPF, POC neg     RBC, urine, microscopic neg     Bacteria, U Microscopic neg     Mucus, UA neg     Epithelial cells, urine per micros 0-1     Crystals,  Ur, HPF, POC neg     Casts, Ur,  LPF, POC neg     Yeast, UA neg        Blood pressure 128/68, pulse 69, temperature 97.8 F (36.6 C), temperature source Oral, resp. rate 16, height 5' 5.75" (1.67 m), weight 148 lb 3.2 oz (67.223 kg), SpO2 98.00%.  Assessment & Plan:  I personally performed the services described in this documentation, which was scribed in my presence. The recorded information has been reviewed and is accurate.  This could be an early flare of diverticulitis. Will treat with Cipro and Flagyl. Referral made back to Dr. Amedeo Plenty. Will check stool for C. Diff.

## 2014-02-12 LAB — BASIC METABOLIC PANEL WITH GFR
BUN: 13 mg/dL (ref 6–23)
CHLORIDE: 102 meq/L (ref 96–112)
CO2: 27 mEq/L (ref 19–32)
CREATININE: 0.95 mg/dL (ref 0.50–1.35)
Calcium: 9.6 mg/dL (ref 8.4–10.5)
GFR, EST NON AFRICAN AMERICAN: 80 mL/min
Glucose, Bld: 88 mg/dL (ref 70–99)
Potassium: 4.4 mEq/L (ref 3.5–5.3)
Sodium: 139 mEq/L (ref 135–145)

## 2014-02-13 ENCOUNTER — Other Ambulatory Visit: Payer: Self-pay | Admitting: Emergency Medicine

## 2014-02-14 LAB — C. DIFFICILE GDH AND TOXIN A/B
C. DIFF TOXIN A/B: NOT DETECTED
C. DIFFICILE GDH: NOT DETECTED

## 2014-02-16 ENCOUNTER — Encounter: Payer: Self-pay | Admitting: Gastroenterology

## 2014-02-20 ENCOUNTER — Other Ambulatory Visit: Payer: Self-pay | Admitting: Gastroenterology

## 2014-02-20 DIAGNOSIS — R1013 Epigastric pain: Secondary | ICD-10-CM

## 2014-03-01 ENCOUNTER — Ambulatory Visit
Admission: RE | Admit: 2014-03-01 | Discharge: 2014-03-01 | Disposition: A | Discharge status: Home / Self Care | Payer: Commercial Managed Care - HMO | Source: Ambulatory Visit | Attending: Gastroenterology | Admitting: Gastroenterology

## 2014-03-01 DIAGNOSIS — R1013 Epigastric pain: Secondary | ICD-10-CM

## 2014-03-09 DIAGNOSIS — Z0271 Encounter for disability determination: Secondary | ICD-10-CM

## 2014-04-30 ENCOUNTER — Telehealth: Payer: Self-pay

## 2014-04-30 NOTE — Telephone Encounter (Signed)
Called and spoke with patient about medical release. Advised him that our office did not order procedure he is wanting records for and advised him to contact his doctor at [facility] for the records instead. Patient understood.

## 2014-05-10 ENCOUNTER — Telehealth: Payer: Self-pay

## 2014-05-10 NOTE — Telephone Encounter (Signed)
Tried to call patient to remind him to get the flu shot, but there was no answer.

## 2014-09-10 ENCOUNTER — Other Ambulatory Visit: Payer: Self-pay | Admitting: Emergency Medicine

## 2014-09-10 ENCOUNTER — Ambulatory Visit (INDEPENDENT_AMBULATORY_CARE_PROVIDER_SITE_OTHER): Payer: Commercial Managed Care - HMO | Admitting: Emergency Medicine

## 2014-09-10 ENCOUNTER — Telehealth: Payer: Self-pay

## 2014-09-10 VITALS — BP 130/74 | HR 73 | Temp 97.9°F | Resp 16 | Ht 66.75 in | Wt 155.0 lb

## 2014-09-10 DIAGNOSIS — G5682 Other specified mononeuropathies of left upper limb: Secondary | ICD-10-CM | POA: Diagnosis not present

## 2014-09-10 DIAGNOSIS — J302 Other seasonal allergic rhinitis: Secondary | ICD-10-CM

## 2014-09-10 MED ORDER — TRIAMCINOLONE ACETONIDE 55 MCG/ACT NA AERO: 2.0000 | INHALATION_SPRAY | Freq: Every day | NASAL | Status: DC

## 2014-09-10 MED ORDER — TIZANIDINE HCL 4 MG PO CAPS: 4.0000 mg | ORAL_CAPSULE | Freq: Three times a day (TID) | ORAL | Status: DC

## 2014-09-10 MED ORDER — CYCLOBENZAPRINE HCL 5 MG PO TABS: 5.0000 mg | ORAL_TABLET | Freq: Three times a day (TID) | ORAL | Status: DC | PRN

## 2014-09-10 MED ORDER — NAPROXEN SODIUM 550 MG PO TABS: 550.0000 mg | ORAL_TABLET | Freq: Two times a day (BID) | ORAL | Status: DC

## 2014-09-10 NOTE — Progress Notes (Signed)
[facility] [address], [place] [address] [phone]  Date:  09/10/2014   Name:  Evan Lopez   DOB:  [birth year]   MRN:  [id number]  PCP:  Kennon Portela, MD    Chief Complaint: Facial Pain and Shoulder Pain   History of Present Illness:  DISHAWN BHARGAVA is a 74 y.o. very pleasant male patient who presents with the following:  Weeks long history of nasal congestion and mucoid nasal drainage. Some post nasal drip No fever ro chills No cough, wheezing or shortness of breath Some left facial pain 6 month history of pain in left shoulder.  No history of injury Has noted increased pain after doing yard work Pain responded to [employer] but still present. Non radiating. No improvement with over the counter medications or other home remedies.  Denies other complaint or health concern today.   Patient Active Problem List   Diagnosis Date Noted  . Chest pain 05/13/2012  . Syncope 05/13/2012  . Hyperlipemia 06/24/2011  . 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 GERD (gastroesophageal reflux disease) 06/24/2011  . 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 NONSPECIFIC ABNORMAL FINDING IN STOOL CONTENTS 02/04/2009    Past Medical History  Diagnosis Date  . GERD (gastroesophageal reflux disease)   . Hyperlipidemia   . DIVERTICULOSIS OF COLON   . Nonspecific abnormal finding in stool contents   . Kidney calculi     Past Surgical History  Procedure Laterality Date  . Appendectomy      History  Substance Use Topics  . Smoking status: Never Smoker   . Smokeless tobacco: Not on file  . Alcohol Use: No    Family History  Problem Relation Age of Onset  . Cancer Mother   . Alcohol abuse Father     No Known Allergies  Medication list has been reviewed and updated.  Current Outpatient Prescriptions on File Prior to Visit  Medication Sig Dispense Refill  . aspirin 81 MG tablet Take 81 mg by mouth daily.    Marland Kitchen omeprazole (PRILOSEC) 20  MG capsule Take 20 mg by mouth 2 (two) times daily.     No current facility-administered medications on file prior to visit.    Review of Systems:  As per HPI, otherwise negative.    Physical Examination: Filed Vitals:   09/10/14 1401  BP: 130/74  Pulse: 73  Temp: 97.9 F (36.6 C)  Resp: 16   Filed Vitals:   09/10/14 1401  Height: 5' 6.75" (1.695 m)  Weight: 155 lb (70.308 kg)   Body mass index is 24.47 kg/(m^2). Ideal Body Weight: Weight in (lb) to have BMI = 25: 158.1  GEN: WDWN, NAD, Non-toxic, A & O x 3 HEENT: Atraumatic, Normocephalic. Neck supple. No masses, No LAD. Ears and Nose: No external deformity. CV: RRR, No M/G/R. No JVD. No thrill. No extra heart sounds. PULM: CTA B, no wheezes, crackles, rhonchi. No retractions. No resp. distress. No accessory muscle use. ABD: S, NT, ND, +BS. No rebound. No HSM. EXTR: No c/c/e NEURO Normal gait.  PSYCH: Normally interactive. Conversant. Not depressed or anxious appearing.  Calm demeanor.  Left shoulder:  Trigger point tenderness medial and superior to angle scapula   Assessment and Plan: Scapulocostal syndrome Discussed injection would prefer meds first Anaprox zanaflex SAR nasacort  Signed,  Ellison Carwin, MD

## 2014-09-10 NOTE — Telephone Encounter (Signed)
Patient states insruance company will not cover the tiZANidine (ZANAFLEX) 4 MG capsule  He is waiting at the pharmacy    He

## 2014-09-10 NOTE — Telephone Encounter (Signed)
What alternative can we rx for the pt?

## 2014-09-10 NOTE — Patient Instructions (Signed)

## 2014-09-10 NOTE — Telephone Encounter (Signed)
Sent Rx for flexeril

## 2014-09-11 NOTE — Telephone Encounter (Signed)
Pt.notified

## 2014-09-23 ENCOUNTER — Ambulatory Visit (INDEPENDENT_AMBULATORY_CARE_PROVIDER_SITE_OTHER): Payer: Commercial Managed Care - HMO | Admitting: Emergency Medicine

## 2014-09-23 ENCOUNTER — Ambulatory Visit (INDEPENDENT_AMBULATORY_CARE_PROVIDER_SITE_OTHER): Payer: Commercial Managed Care - HMO

## 2014-09-23 VITALS — BP 110/70 | HR 78 | Temp 98.0°F | Ht 66.75 in | Wt 153.0 lb

## 2014-09-23 DIAGNOSIS — X32XXXA Exposure to sunlight, initial encounter: Secondary | ICD-10-CM

## 2014-09-23 DIAGNOSIS — K5732 Diverticulitis of large intestine without perforation or abscess without bleeding: Secondary | ICD-10-CM | POA: Diagnosis not present

## 2014-09-23 DIAGNOSIS — L570 Actinic keratosis: Secondary | ICD-10-CM

## 2014-09-23 LAB — POCT CBC
Granulocyte percent: 81 %G — AB (ref 37–80)
HCT, POC: 46.4 % (ref 43.5–53.7)
HEMOGLOBIN: 15.6 g/dL (ref 14.1–18.1)
Lymph, poc: 1.3 (ref 0.6–3.4)
MCH: 28.9 pg (ref 27–31.2)
MCHC: 33.6 g/dL (ref 31.8–35.4)
MCV: 85.9 fL (ref 80–97)
MID (cbc): 0.6 (ref 0–0.9)
MPV: 6.9 fL (ref 0–99.8)
POC Granulocyte: 8.1 — AB (ref 2–6.9)
POC LYMPH PERCENT: 13.1 %L (ref 10–50)
POC MID %: 5.9 % (ref 0–12)
Platelet Count, POC: 242 10*3/uL (ref 142–424)
RBC: 5.41 M/uL (ref 4.69–6.13)
RDW, POC: 13.6 %
WBC: 10 10*3/uL (ref 4.6–10.2)

## 2014-09-23 MED ORDER — CIPROFLOXACIN HCL 500 MG PO TABS: 500.0000 mg | ORAL_TABLET | Freq: Two times a day (BID) | ORAL | Status: DC

## 2014-09-23 MED ORDER — METRONIDAZOLE 500 MG PO TABS: 500.0000 mg | ORAL_TABLET | Freq: Two times a day (BID) | ORAL | Status: DC

## 2014-09-23 NOTE — Patient Instructions (Signed)

## 2014-09-23 NOTE — Progress Notes (Signed)
[facility] [address], [place] [address] [phone]  Date:  09/23/2014   Name:  Evan Lopez   DOB:  22-Jul-1940   MRN:  [id number]  PCP:  Kennon Portela, MD    Chief Complaint: Diverticulitis and Referral   History of Present Illness:  Evan Lopez is a 74 y.o. very pleasant male patient who presents with the following:  Patient has a history of 4-5 episodes of acute diverticulitis. Now to office with LLQ pain. Says pain started this morning after arising No fever or chills No nausea or vomiting.  No stool change The patient has no complaint of blood, mucous, or pus in her stools. No appetite today No improvement with over the counter medications or other home remedies.  Denies other complaint or health concern today.   Patient Active Problem List   Diagnosis Date Noted  . Chest pain 05/13/2012  . Syncope 05/13/2012  . Hyperlipemia 06/24/2011  . 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 GERD (gastroesophageal reflux disease) 06/24/2011  . 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 NONSPECIFIC ABNORMAL FINDING IN STOOL CONTENTS 02/04/2009    Past Medical History  Diagnosis Date  . GERD (gastroesophageal reflux disease)   . Hyperlipidemia   . DIVERTICULOSIS OF COLON   . Nonspecific abnormal finding in stool contents   . Kidney calculi     Past Surgical History  Procedure Laterality Date  . Appendectomy      History  Substance Use Topics  . Smoking status: Never Smoker   . Smokeless tobacco: Not on file  . Alcohol Use: No    Family History  Problem Relation Age of Onset  . Cancer Mother   . Alcohol abuse Father     No Known Allergies  Medication list has been reviewed and updated.  Current Outpatient Prescriptions on File Prior to Visit  Medication Sig Dispense Refill  . aspirin 81 MG tablet Take 81 mg by mouth daily.    . cyclobenzaprine (FLEXERIL) 5 MG tablet Take 1 tablet (5 mg total) by mouth 3 (three)  times daily as needed for muscle spasms. 30 tablet 0  . naproxen sodium (ANAPROX DS) 550 MG tablet Take 1 tablet (550 mg total) by mouth 2 (two) times daily with a meal. 40 tablet 0  . omeprazole (PRILOSEC) 20 MG capsule Take 20 mg by mouth 2 (two) times daily.    Marland Kitchen tiZANidine (ZANAFLEX) 4 MG capsule Take 1 capsule (4 mg total) by mouth 3 (three) times daily. 45 capsule 1  . triamcinolone (NASACORT AQ) 55 MCG/ACT AERO nasal inhaler Place 2 sprays into the nose daily. 1 Inhaler 12   No current facility-administered medications on file prior to visit.    Review of Systems:  Review of Systems  Constitutional: Negative for fever, chills and fatigue.  HENT: Negative for congestion, ear pain, hearing loss, postnasal drip, rhinorrhea and sinus pressure.   Eyes: Negative for discharge and redness.  Respiratory: Negative for cough, shortness of breath and wheezing.   Cardiovascular: Negative for chest pain and leg swelling.  Gastrointestinal: Negative for nausea, vomiting, abdominal pain, constipation and blood in stool.  Genitourinary: Negative for dysuria, urgency and frequency.  Musculoskeletal: Negative for neck stiffness.  Skin: Negative for rash.  Neurological: Negative for seizures, weakness and headaches.     Physical Examination: Filed Vitals:   09/23/14 1123  BP: 110/70  Pulse: 78  Temp: 98 F (36.7 C)   Filed  Vitals:   09/23/14 1123  Height: 5' 6.75" (1.695 m)  Weight: 153 lb (69.4 kg)   Body mass index is 24.16 kg/(m^2). Ideal Body Weight: Weight in (lb) to have BMI = 25: 158.1  GEN: WDWN, NAD, Non-toxic, A & O x 3 HEENT: Atraumatic, Normocephalic. Neck supple. No masses, No LAD. Ears and Nose: No external deformity. CV: RRR, No M/G/R. No JVD. No thrill. No extra heart sounds. PULM: CTA B, no wheezes, crackles, rhonchi. No retractions. No resp. distress. No accessory muscle use. ABD: S, tender LLQ, ND, +BS. No rebound. No HSM. EXTR: No c/c/e NEURO Normal gait.   PSYCH: Normally interactive. Conversant. Not depressed or anxious appearing.  Calm demeanor.    Assessment and Plan: Diverticulitis cipro Flagyl Surgical consultation  Signed Ellison Carwin, MD   [facility] reading (PRIMARY) by  Dr. Ouida Sills.   No free air or obst.  Results for orders placed or performed in visit on 09/23/14  POCT CBC  Result Value Ref Range   WBC 10.0 4.6 - 10.2 K/uL   Lymph, poc 1.3 0.6 - 3.4   POC LYMPH PERCENT 13.1 10 - 50 %L   MID (cbc) 0.6 0 - 0.9   POC MID % 5.9 0 - 12 %M   POC Granulocyte 8.1 (A) 2 - 6.9   Granulocyte percent 81.0 (A) 37 - 80 %G   RBC 5.41 4.69 - 6.13 M/uL   Hemoglobin 15.6 14.1 - 18.1 g/dL   HCT, POC 46.4 43.5 - 53.7 %   MCV 85.9 80 - 97 fL   MCH, POC 28.9 27 - 31.2 pg   MCHC 33.6 31.8 - 35.4 g/dL   RDW, POC 13.6 %   Platelet Count, POC 242 142 - 424 K/uL   MPV 6.9 0 - 99.8 fL

## 2014-09-27 ENCOUNTER — Other Ambulatory Visit: Payer: Self-pay | Admitting: Emergency Medicine

## 2014-09-29 NOTE — Telephone Encounter (Signed)
Dr Ouida Sills, do you want to give RFs?

## 2014-10-25 IMAGING — CR DG CHEST 2V
2 series · 2 of 2 positions shown · non-contrast
Comparison: 06/28/2007.

CLINICAL DATA: Shortness of breath.

CHEST - 2 VIEW

[PA]
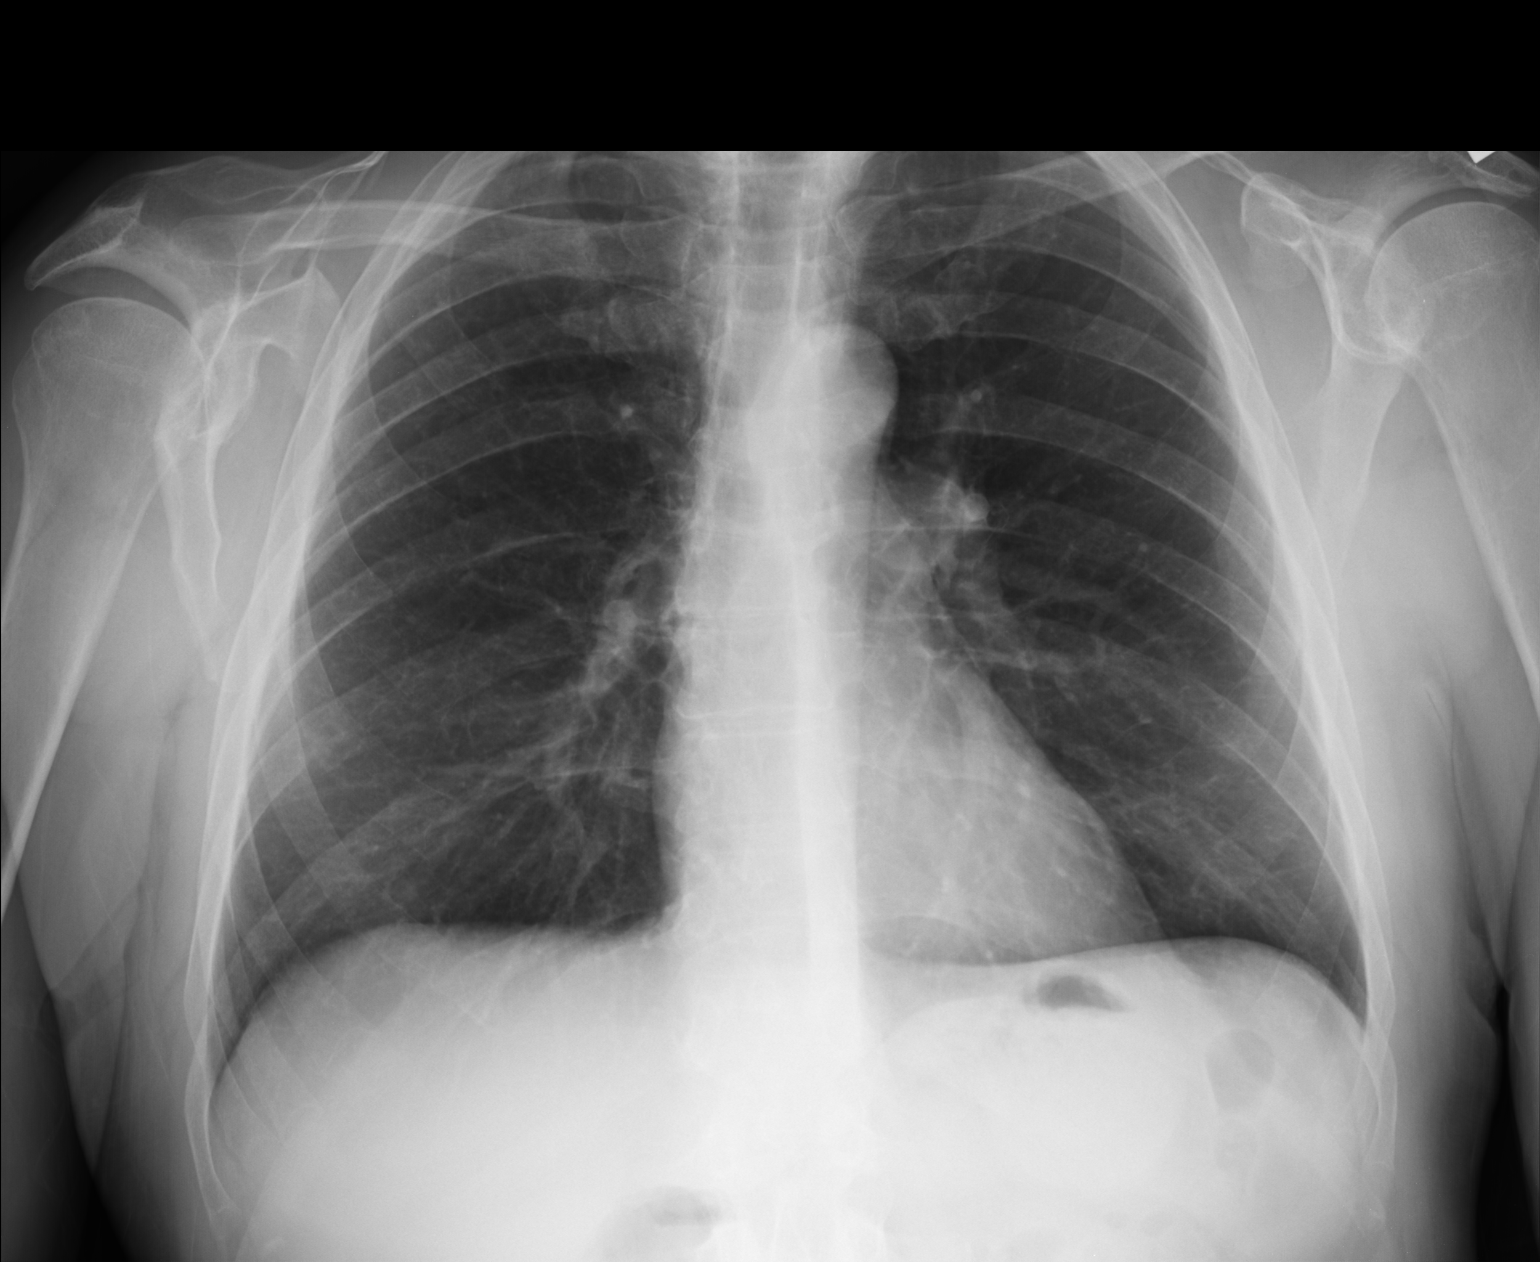

[lateral]
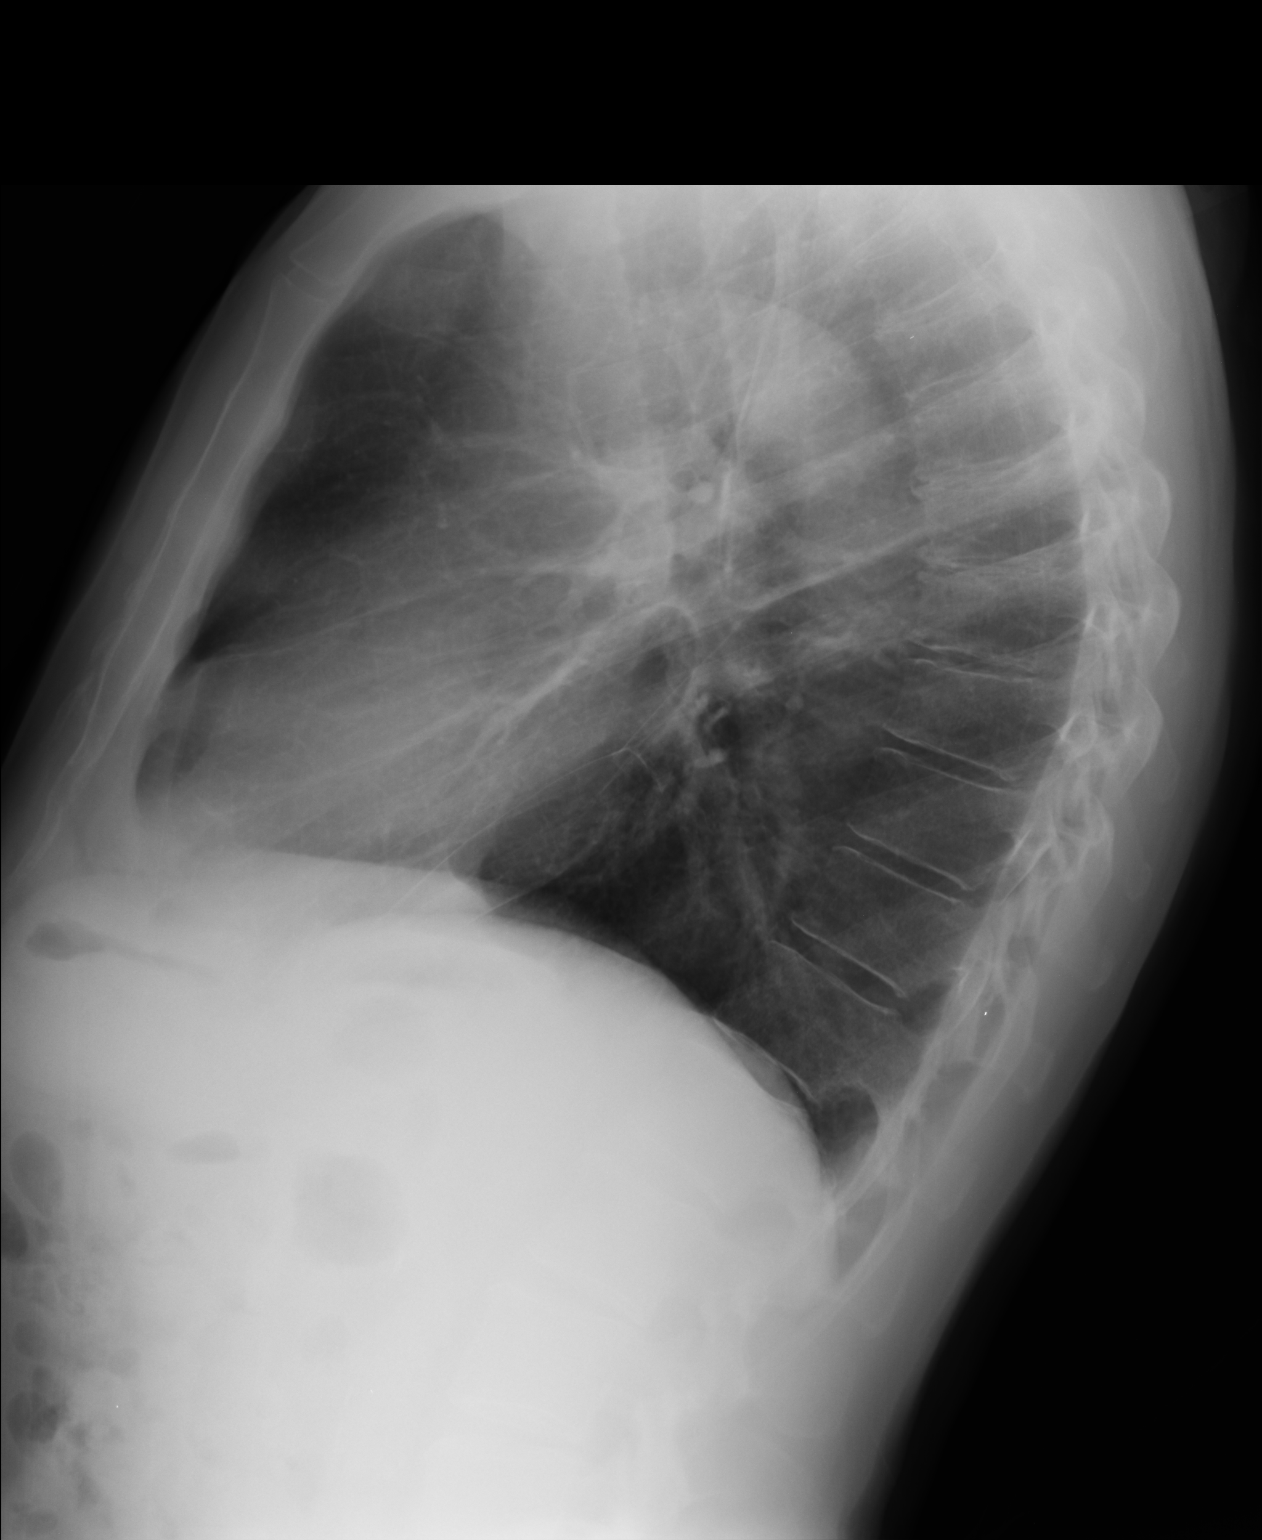

[2 of 2 positions shown; findings below may reference images not displayed]

FINDINGS: The heart, mediastinum and hilar contours are normal.
The lungs are clear and fully expanded.  No effusions or
pneumothoraces.  The bony thorax is intact.
IMPRESSION: No active disease.

## 2015-01-29 ENCOUNTER — Ambulatory Visit (INDEPENDENT_AMBULATORY_CARE_PROVIDER_SITE_OTHER): Payer: Commercial Managed Care - HMO | Admitting: Emergency Medicine

## 2015-01-29 VITALS — BP 130/72 | HR 77 | Temp 98.6°F | Resp 18 | Ht 66.75 in | Wt 157.0 lb

## 2015-01-29 DIAGNOSIS — M25512 Pain in left shoulder: Secondary | ICD-10-CM

## 2015-01-29 DIAGNOSIS — M7552 Bursitis of left shoulder: Secondary | ICD-10-CM | POA: Diagnosis not present

## 2015-01-29 DIAGNOSIS — G5682 Other specified mononeuropathies of left upper limb: Secondary | ICD-10-CM | POA: Diagnosis not present

## 2015-01-29 MED ORDER — METHYLPREDNISOLONE ACETATE 80 MG/ML IJ SUSP
120.0000 mg | Freq: Once | INTRAMUSCULAR | Status: AC
  Administered 2015-01-29: 120 mg via INTRAMUSCULAR

## 2015-01-29 MED ORDER — NAPROXEN SODIUM 550 MG PO TABS: ORAL_TABLET | ORAL | Status: DC

## 2015-01-29 NOTE — Progress Notes (Signed)
Subjective:  Patient ID: Evan Lopez, male    DOB: 1941-02-10  Age: 74 y.o. MRN: [id number]  CC: Shoulder Pain and Back Pain   HPI Evan Lopez presents  with left shoulder pain. He has pain both anterior shoulder and the posterior shoulder medial superior to the angle of scapula. He has no radiation of pain numbness tingling or weakness. Has no history of prior injury. He was seen 5 months ago with pain in her shoulder or on Anaprox with no improvement.  History Evan Lopez has a past medical history of GERD (gastroesophageal reflux disease); Hyperlipidemia; DIVERTICULOSIS OF COLON; Nonspecific abnormal finding in stool contents; and Kidney calculi.   He has past surgical history that includes Appendectomy.   His  family history includes Alcohol abuse in his father; Cancer in his mother.  He   reports that he has never smoked. He does not have any smokeless tobacco history on file. He reports that he does not drink alcohol or use illicit drugs.  Outpatient Prescriptions Prior to Visit  Medication Sig Dispense Refill  . aspirin 81 MG tablet Take 81 mg by mouth daily.    Marland Kitchen omeprazole (PRILOSEC) 20 MG capsule Take 20 mg by mouth 2 (two) times daily.    . ciprofloxacin (CIPRO) 500 MG tablet Take 1 tablet (500 mg total) by mouth 2 (two) times daily. (Patient not taking: Reported on 01/29/2015) 20 tablet 0  . cyclobenzaprine (FLEXERIL) 5 MG tablet Take 1 tablet (5 mg total) by mouth 3 (three) times daily as needed for muscle spasms. (Patient not taking: Reported on 01/29/2015) 30 tablet 0  . metroNIDAZOLE (FLAGYL) 500 MG tablet Take 1 tablet (500 mg total) by mouth 2 (two) times daily with a meal. DO NOT CONSUME ALCOHOL WHILE TAKING THIS MEDICATION. (Patient not taking: Reported on 01/29/2015) 20 tablet 0  . tiZANidine (ZANAFLEX) 4 MG capsule Take 1 capsule (4 mg total) by mouth 3 (three) times daily. (Patient not taking: Reported on 01/29/2015) 45 capsule 1  . triamcinolone (NASACORT AQ) 55  MCG/ACT AERO nasal inhaler Place 2 sprays into the nose daily. (Patient not taking: Reported on 01/29/2015) 1 Inhaler 12  . naproxen sodium (ANAPROX) 550 MG tablet TAKE 1 TABLET BY MOUTH 2 TIMES DAILY WITH A MEAL. (Patient not taking: Reported on 01/29/2015) 60 tablet 4   No facility-administered medications prior to visit.    Social History   Social History  . Marital Status: Married    Spouse Name: N/A  . Number of Children: N/A  . Years of Education: N/A   Social History Main Topics  . Smoking status: Never Smoker   . Smokeless tobacco: None  . Alcohol Use: No  . Drug Use: No  . Sexual Activity: Yes   Other Topics Concern  . None   Social History Narrative     Review of Systems  Constitutional: Negative for fever, chills and appetite change.  HENT: Negative for congestion, ear pain, postnasal drip, sinus pressure and sore throat.   Eyes: Negative for pain and redness.  Respiratory: Negative for cough, shortness of breath and wheezing.   Cardiovascular: Negative for leg swelling.  Gastrointestinal: Negative for nausea, vomiting, abdominal pain, diarrhea, constipation and blood in stool.  Endocrine: Negative for polyuria.  Genitourinary: Negative for dysuria, urgency, frequency and flank pain.  Musculoskeletal: Negative for gait problem.  Skin: Negative for rash.  Neurological: Negative for weakness and headaches.  Psychiatric/Behavioral: Negative for confusion and decreased concentration. The patient is not nervous/anxious.  Objective:  BP 130/72 mmHg  Pulse 77  Temp(Src) 98.6 F (37 C) (Oral)  Resp 18  Ht 5' 6.75" (1.695 m)  Wt 157 lb (71.215 kg)  BMI 24.79 kg/m2  SpO2 97%  Physical Exam  Constitutional: He is oriented to person, place, and time. He appears well-developed and well-nourished.  HENT:  Head: Normocephalic and atraumatic.  Eyes: Conjunctivae are normal. Pupils are equal, round, and reactive to light.  Pulmonary/Chest: Effort normal.    Musculoskeletal: He exhibits no edema.  Neurological: He is alert and oriented to person, place, and time.  Skin: Skin is dry.  Psychiatric: He has a normal mood and affect. His behavior is normal. Thought content normal.   He has marked tenderness and trigger point characteristic for scheduled costal syndrome of the left posterior shoulder he also has tender anterior shoulder bursa on the left   Assessment & Plan:   Evan Lopez was seen today for shoulder pain and back pain.  Diagnoses and all orders for this visit:  Scapulocostal syndrome, left -     methylPREDNISolone acetate (DEPO-MEDROL) injection 120 mg; Inject 1.5 mLs (120 mg total) into the muscle once.  Shoulder bursitis, left -     methylPREDNISolone acetate (DEPO-MEDROL) injection 120 mg; Inject 1.5 mLs (120 mg total) into the muscle once.  Left shoulder pain  Other orders -     naproxen sodium (ANAPROX) 550 MG tablet; TAKE 1 TABLET BY MOUTH 2 TIMES DAILY WITH A MEAL.   I am having Evan Lopez maintain his omeprazole, aspirin, tiZANidine, triamcinolone, cyclobenzaprine, ciprofloxacin, metroNIDAZOLE, and naproxen sodium. We will continue to administer methylPREDNISolone acetate.  Meds ordered this encounter  Medications  . methylPREDNISolone acetate (DEPO-MEDROL) injection 120 mg    Sig:   . naproxen sodium (ANAPROX) 550 MG tablet    Sig: TAKE 1 TABLET BY MOUTH 2 TIMES DAILY WITH A MEAL.    Dispense:  60 tablet    Refill:  4   Both locations were injected with lidocaine and Marcaine the shoulder bursa was injected with 40 mg Depo-Medrol the scheduled costal site was injected with 80 mg Depo-Medrol and tolerated it well   Appropriate red flag conditions were discussed with the patient as well as actions that should be taken.  Patient expressed his understanding.  Follow-up: Return if symptoms worsen or fail to improve.  Roselee Culver, MD

## 2015-01-29 NOTE — Patient Instructions (Signed)

## 2015-03-25 ENCOUNTER — Telehealth: Payer: Self-pay

## 2015-03-25 NOTE — Telephone Encounter (Signed)
Pt would like to have a referral to [facility], he has an appt on 04/14/15. Please call [phone]

## 2015-03-25 NOTE — Telephone Encounter (Signed)
Left message on machine to call back  

## 2015-03-26 NOTE — Telephone Encounter (Signed)
He has [employer]

## 2015-03-26 NOTE — Telephone Encounter (Signed)
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

## 2015-03-26 NOTE — Telephone Encounter (Signed)
It is for sun damage on his face.

## 2015-05-28 ENCOUNTER — Ambulatory Visit (INDEPENDENT_AMBULATORY_CARE_PROVIDER_SITE_OTHER): Payer: Commercial Managed Care - HMO | Admitting: Physician Assistant

## 2015-05-28 VITALS — BP 120/76 | HR 68 | Temp 98.0°F | Resp 20 | Ht 66.0 in | Wt 157.2 lb

## 2015-05-28 DIAGNOSIS — R1032 Left lower quadrant pain: Secondary | ICD-10-CM | POA: Diagnosis not present

## 2015-05-28 DIAGNOSIS — K5732 Diverticulitis of large intestine without perforation or abscess without bleeding: Secondary | ICD-10-CM

## 2015-05-28 MED ORDER — CIPROFLOXACIN HCL 750 MG PO TABS: 750.0000 mg | ORAL_TABLET | Freq: Two times a day (BID) | ORAL | Status: DC

## 2015-05-28 MED ORDER — METRONIDAZOLE 500 MG PO TABS: 500.0000 mg | ORAL_TABLET | Freq: Four times a day (QID) | ORAL | Status: DC

## 2015-05-28 NOTE — Patient Instructions (Signed)
Start taking OTC metamucil as directed on the package after you finish your medication.   Clear Liquid Diet (three days) A clear liquid diet is a short-term diet that is prescribed to provide the necessary fluid and basic energy you need when you can have nothing else. The clear liquid diet consists of liquids or solids that will become liquid at room temperature. You should be able to see through the liquid. There are many reasons that you may be restricted to clear liquids, such as:  When you have a sudden-onset (acute) condition that occurs before or after surgery.  To help your body slowly get adjusted to food again after a long period when you were unable to have food.  Replacement of fluids when you have a diarrheal disease.  When you are going to have certain exams, such as a colonoscopy, in which instruments are inserted inside your body to look at parts of your digestive system. WHAT CAN I HAVE? A clear liquid diet does not provide all the nutrients you need. It is important to choose a variety of the following items to get as many nutrients as possible:  Vegetable juices that do not have pulp.  Fruit juices and fruit drinks that do not have pulp.  Coffee (regular or decaffeinated), tea, or soda at the discretion of your health care provider.  Clear bouillon, broth, or strained broth-based soups.  High-protein and flavored gelatins.  Sugar or honey.  Ices or frozen ice pops that do not contain milk. If you are not sure whether you can have certain items, you should ask your health care provider. You may also ask your health care provider if there are any other clear liquid options.   This information is not intended to replace advice given to you by your health care provider. Make sure you discuss any questions you have with your health care provider.   Document Released: 05/11/2005 Document Revised: 05/16/2013 Document Reviewed: 04/07/2013 Elsevier Interactive Patient  Education [employer].

## 2015-05-28 NOTE — Progress Notes (Signed)
   05/28/2015 6:47 PM   DOB: 04-06-1941 / MRN: [id number]  SUBJECTIVE:  Evan Lopez is a 75 y.o. male with a history diverticulosis confirmed on colonoscopy of 2014.  Reports that he has had several bouts since and has taken cipro and flagyl with good relief and without side effect.   He is complaining of left lower quadrant abdominal pain that started roughly 3-4 days ago and this feels like previous episodes of diverticulitis.  He denies fever, chills, hematochezia.  He is eating well at this time.  No difficulty with po fluids.    He has No Known Allergies.   He  has a past medical history of GERD (gastroesophageal reflux disease); Hyperlipidemia; DIVERTICULOSIS OF COLON; Nonspecific abnormal finding in stool contents; and Kidney calculi.    He  reports that he has never smoked. He does not have any smokeless tobacco history on file. He reports that he does not drink alcohol or use illicit drugs. He  reports that he currently engages in sexual activity. The patient  has past surgical history that includes Appendectomy.  His family history includes Alcohol abuse in his father; Cancer in his mother.  Review of Systems  Constitutional: Negative for fever.  Respiratory: Negative for cough.   Cardiovascular: Negative for chest pain.  Gastrointestinal: Negative for nausea.  Genitourinary: Negative for dysuria, urgency and frequency.  Neurological: Negative for dizziness and headaches.    Problem list and medications reviewed and updated by myself where necessary, and exist elsewhere in the encounter.   OBJECTIVE:  BP 120/76 mmHg  Pulse 68  Temp(Src) 98 F (36.7 C) (Oral)  Resp 20  Ht 5\' 6"  (1.676 m)  Wt 157 lb 3.2 oz (71.305 kg)  BMI 25.38 kg/m2  SpO2 98%  Physical Exam  Constitutional: He is oriented to person, place, and time. He appears well-developed. He does not appear ill.  Eyes: Conjunctivae and EOM are normal. Pupils are equal, round, and reactive to light.    Cardiovascular: Normal rate.   Pulmonary/Chest: Effort normal.  Abdominal: Soft. Bowel sounds are normal. He exhibits no distension and no mass. There is tenderness (LLQ). There is no rebound and no guarding.  Musculoskeletal: Normal range of motion.  Neurological: He is alert and oriented to person, place, and time. No cranial nerve deficit. Coordination normal.  Skin: Skin is warm and dry. He is not diaphoretic.  Psychiatric: He has a normal mood and affect.  Nursing note and vitals reviewed.   No results found for this or any previous visit (from the past 48 hour(s)).  ASSESSMENT AND PLAN  Venard was seen today for diverticulitis.  Diagnoses and all orders for this visit:  LLQ pain: Patient with likely early diverticulitis. Will treat per [facility] guide.  Advised that he RTC as needed for this problem, sooner if worse.    Diverticulitis of colon -     ciprofloxacin (CIPRO) 750 MG tablet; Take 1 tablet (750 mg total) by mouth 2 (two) times daily. -     metroNIDAZOLE (FLAGYL) 500 MG tablet; Take 1 tablet (500 mg total) by mouth 4 (four) times daily.    The patient was advised to call or return to clinic if he does not see an improvement in symptoms or to seek the care of the closest emergency department if he worsens with the above plan.   Philis Fendt, MHS, PA-C [facility] 05/28/2015 6:47 PM

## 2015-06-03 ENCOUNTER — Encounter (HOSPITAL_COMMUNITY): Payer: Self-pay

## 2015-06-03 ENCOUNTER — Ambulatory Visit (HOSPITAL_COMMUNITY)
Admission: RE | Admit: 2015-06-03 | Discharge: 2015-06-03 | Disposition: A | Discharge status: Home / Self Care | Payer: Commercial Managed Care - HMO | Source: Ambulatory Visit | Attending: Family Medicine | Admitting: Family Medicine

## 2015-06-03 ENCOUNTER — Ambulatory Visit (INDEPENDENT_AMBULATORY_CARE_PROVIDER_SITE_OTHER): Payer: Medicare HMO | Admitting: Family Medicine

## 2015-06-03 VITALS — BP 130/80 | HR 67 | Temp 98.3°F | Resp 18 | Ht 65.5 in | Wt 152.0 lb

## 2015-06-03 DIAGNOSIS — R1032 Left lower quadrant pain: Secondary | ICD-10-CM

## 2015-06-03 DIAGNOSIS — K5730 Diverticulosis of large intestine without perforation or abscess without bleeding: Secondary | ICD-10-CM | POA: Insufficient documentation

## 2015-06-03 DIAGNOSIS — M5136 Other intervertebral disc degeneration, lumbar region: Secondary | ICD-10-CM | POA: Insufficient documentation

## 2015-06-03 DIAGNOSIS — I700 Atherosclerosis of aorta: Secondary | ICD-10-CM | POA: Diagnosis not present

## 2015-06-03 LAB — BUN: BUN: 12 mg/dL (ref 6–20)

## 2015-06-03 LAB — CREATININE, SERUM
CREATININE: 1.07 mg/dL (ref 0.61–1.24)
GFR calc Af Amer: >60 mL/min (ref >60)
GFR calc non Af Amer: >60 mL/min (ref >60)

## 2015-06-03 MED ORDER — IOHEXOL 300 MG/ML  SOLN
100.0000 mL | Freq: Once | INTRAMUSCULAR | Status: AC | PRN
  Administered 2015-06-03: 100 mL via INTRAVENOUS

## 2015-06-03 MED ORDER — AMOXICILLIN-POT CLAVULANATE 875-125 MG PO TABS: 1.0000 | ORAL_TABLET | Freq: Two times a day (BID) | ORAL | Status: DC

## 2015-06-03 NOTE — Progress Notes (Signed)
[facility] [address] [place] [address] [phone]  Date:  06/03/2015   Name:  Evan Lopez   DOB:  Feb 14, 1941   MRN:  [id number]  PCP:  Kennon Portela, MD    Chief Complaint: Follow-up   History of Present Illness:  Evan Lopez is a 75 y.o. very pleasant male patient who presents with the following:  He was here 6 days ago with LLq pain, history of diverticulosis with a few bouts of diverticulitis over the last 3 years.  He was started on cipro 740 BID and flagyl 500 QID which had worked for him in the past.   He is here today because he continues to note a tender area in his belly- in the past his sx have resolved more quickly.    His GI is with [facility]. He states that a similar situation occurred about a year ago and he was changed to augmentin, recovered He is able to eat again, no vomiting.  He has noted some diarrhea.   He has not noted a fever.    The pain is about the same- not better or worse really.  He just wonders if we should change his abx since he is not better He continues to feel uncomfortable due to abd discomfort  No urinary sx  Patient Active Problem List   Diagnosis Date Noted  . Chest pain 05/13/2012  . Syncope 05/13/2012  . Hyperlipemia 06/24/2011  . 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 GERD (gastroesophageal reflux disease) 06/24/2011  . 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 NONSPECIFIC ABNORMAL FINDING IN STOOL CONTENTS 02/04/2009    Past Medical History  Diagnosis Date  . GERD (gastroesophageal reflux disease)   . Hyperlipidemia   . DIVERTICULOSIS OF COLON   . Nonspecific abnormal finding in stool contents   . Kidney calculi     Past Surgical History  Procedure Laterality Date  . Appendectomy      Social History  Substance Use Topics  . Smoking status: Never Smoker   . Smokeless tobacco: None  . Alcohol Use: No    Family History  Problem Relation Age of Onset  . Cancer Mother    . Alcohol abuse Father     No Known Allergies  Medication list has been reviewed and updated.  Current Outpatient Prescriptions on File Prior to Visit  Medication Sig Dispense Refill  . aspirin 81 MG tablet Take 81 mg by mouth daily.    . ciprofloxacin (CIPRO) 750 MG tablet Take 1 tablet (750 mg total) by mouth 2 (two) times daily. 14 tablet 0  . metroNIDAZOLE (FLAGYL) 500 MG tablet Take 1 tablet (500 mg total) by mouth 4 (four) times daily. 28 tablet 0  . naproxen sodium (ANAPROX) 550 MG tablet TAKE 1 TABLET BY MOUTH 2 TIMES DAILY WITH A MEAL. 60 tablet 4  . omeprazole (PRILOSEC) 20 MG capsule Take 20 mg by mouth 2 (two) times daily.     No current facility-administered medications on file prior to visit.    Review of Systems:  As per HPI- otherwise negative.   Physical Examination: Filed Vitals:   06/03/15 1348  BP: 130/80  Pulse: 67  Temp: 98.3 F (36.8 C)  Resp: 18   Filed Vitals:   06/03/15 1348  Height: 5' 5.5" (1.664 m)  Weight: 152 lb (68.947 kg)   Body mass index is 24.9 kg/(m^2). Ideal Body Weight: Weight in (lb) to have  BMI = 25: 152.2  GEN: WDWN, NAD, Non-toxic, A & O x 3, looks well, normal weight HEENT: Atraumatic, Normocephalic. Neck supple. No masses, No LAD. Ears and Nose: No external deformity. CV: RRR, No M/G/R. No JVD. No thrill. No extra heart sounds. PULM: CTA B, no wheezes, crackles, rhonchi. No retractions. No resp. distress. No accessory muscle use. ABD: S, ND, +BS. No rebound. No HSM.  LLQ tenderness to palpation is still significant  EXTR: No c/c/e NEURO Normal gait.  PSYCH: Normally interactive. Conversant. Not depressed or anxious appearing.  Calm demeanor.    Assessment and Plan: LLQ pain - Plan: CT Abdomen Pelvis W Contrast, BUN+Creat, amoxicillin-clavulanate (AUGMENTIN) 875-125 MG tablet  Discussed with pt- he has been treated with appropriate abx but pain suggestive of diverticulitis continues.  It is possible that he has an  abscess.  Recommended a CT scan and he is in agreement   Signed Lamar Blinks, MD  Received CT as below.  Called pt to discuss.  No abscess, CT is normal.  Will change to augmentin but asked him to please let me know if not improved within 2 days  COMPARISON: CT scan of November 19, 2012.  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 Visualized lung bases are unremarkable.  No gallstones are noted. No significant abnormality is noted in the liver, spleen or pancreas. Adrenal glands and kidneys are unremarkable. No hydronephrosis or renal obstruction is noted. No renal or ureteral calculi are noted. Atherosclerosis of abdominal aorta is noted without aneurysm formation. There is no evidence of bowel obstruction. No abnormal fluid collection is noted. Diverticulosis of descending colon is noted without inflammation. Urinary bladder appears normal. Mild prostatic hypertrophy is stable. No significant adenopathy is noted.  IMPRESSION: Atherosclerosis of abdominal aorta without aneurysm formation.  Diverticulosis of descending colon without inflammation.  No acute abnormality seen in the abdomen or pelvis.

## 2015-06-03 NOTE — Patient Instructions (Addendum)
Please report to the [facility] for your scheduled CT  Go through the Maternity Admissions entrance and go to the first glass window on the right  Someone will get you from there.  After your scan I will talk to you on the phone. Assuming that you do not have an abscess I will change your antibiotics to the augmentin.  If you do have an abscess we will take care of it

## 2015-06-10 ENCOUNTER — Telehealth: Payer: Self-pay

## 2015-06-10 NOTE — Telephone Encounter (Signed)
Wants to let dr copland know what he is feeling better-is still taking the meds but is doing better

## 2015-06-11 NOTE — Telephone Encounter (Signed)
Great- noted.

## 2015-07-26 ENCOUNTER — Ambulatory Visit (INDEPENDENT_AMBULATORY_CARE_PROVIDER_SITE_OTHER): Payer: Commercial Managed Care - HMO

## 2015-07-26 ENCOUNTER — Ambulatory Visit (INDEPENDENT_AMBULATORY_CARE_PROVIDER_SITE_OTHER): Payer: Commercial Managed Care - HMO | Admitting: Family Medicine

## 2015-07-26 VITALS — BP 110/68 | HR 75 | Temp 98.3°F | Resp 16 | Ht 66.0 in | Wt 155.0 lb

## 2015-07-26 DIAGNOSIS — M6588 Other synovitis and tenosynovitis, other site: Secondary | ICD-10-CM | POA: Diagnosis not present

## 2015-07-26 DIAGNOSIS — M79671 Pain in right foot: Secondary | ICD-10-CM | POA: Diagnosis not present

## 2015-07-26 DIAGNOSIS — M7750 Other enthesopathy of unspecified foot: Secondary | ICD-10-CM

## 2015-07-26 DIAGNOSIS — M25561 Pain in right knee: Secondary | ICD-10-CM

## 2015-07-26 MED ORDER — MELOXICAM 7.5 MG PO TABS: 7.5000 mg | ORAL_TABLET | Freq: Every day | ORAL | Status: DC

## 2015-07-26 NOTE — Patient Instructions (Addendum)
Take meloxicam 7.5 mg 1 daily with food  Do not take naproxen while on the meloxicam. When she finished the meloxicam you can use the naproxen again if needed  Wear a heel cup as directed  If you continue to have a lot of trouble with pain may need to refer you to an orthopedic doctor, but let's give it several weeks with the cup to see how you do    Because you received an x-ray today, you will receive an invoice from [facility]. Please contact [facility] at [phone] with questions or concerns regarding your invoice. Our billing staff will not be able to assist you with those questions.

## 2015-07-26 NOTE — Progress Notes (Signed)
Patient ID: Evan Lopez, male    DOB: 05/06/1941  Age: 75 y.o. MRN: [id number]  Chief Complaint  Patient presents with  . Foot Injury    right heel, a few weeks  . Knee Pain    right    Subjective:   75 year old man is been having pain in his right heel for the last 3 weeks or so. No specific injury. 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 He said over the last week his medial aspect of the right knee is been hurting him.  Current allergies, medications, problem list, past/family and social histories reviewed.  Objective:  BP 110/68 mmHg  Pulse 75  Temp(Src) 98.3 F (36.8 C)  Resp 16  Ht 5\' 6"  (1.676 m)  Wt 155 lb (70.308 kg)  BMI 25.03 kg/m2  SpO2 98%  No major acute distress. Good range of motion of the knee. No crepitance. He is tender on the medial collateral ligament. He has heel is tender on the medial aspect of the right heel extending upward to posterior to the malleolus. Not significantly tender on the base of the calcaneus itself, but mediolaterally there is tenderness.  Assessment & Plan:   Assessment: 1. Heel pain, right   2. Right knee pain   3. Tendonitis of ankle or foot       Plan: X-rays of heel and knee  X-rays were reviewed by me and also have been reviewed by the radiologist and have no major problems. There is minimal calcium depositing which is probably early spur formation but not really a spur yet.  Orders Placed This Encounter  Procedures  . DG Knee Complete 4 Views Right    Order Specific Question:  Reason for Exam (SYMPTOM  OR DIAGNOSIS REQUIRED)    Answer:  medial knee pain, no injury    Order Specific Question:  Preferred imaging location?    Answer:  External  . DG Os Calcis Right    Order Specific Question:  Reason for Exam (SYMPTOM  OR DIAGNOSIS REQUIRED)    Answer:  right heel pain    Order Specific Question:  Preferred imaging location?    Answer:  External    Meds ordered this encounter   Medications  . meloxicam (MOBIC) 7.5 MG tablet    Sig: Take 1 tablet (7.5 mg total) by mouth daily.    Dispense:  20 tablet    Refill:  0         Patient Instructions  Take meloxicam 7.5 mg 1 daily with food  Do not take naproxen while on the meloxicam. When she finished the meloxicam you can use the naproxen again if needed  Wear a heel cup as directed  If you continue to have a lot of trouble with pain may need to refer you to an orthopedic doctor, but let's give it several weeks with the cup to see how you do    Because you received an x-ray today, you will receive an invoice from [facility]. Please contact [facility] at [phone] with questions or concerns regarding your invoice. Our billing staff will not be able to assist you with those questions.      Return if symptoms worsen or fail to improve.   Nadie Fiumara, MD 07/26/2015

## 2015-08-26 ENCOUNTER — Telehealth: Payer: Self-pay

## 2015-08-26 NOTE — Telephone Encounter (Signed)
Pt needs a referral to a foot specialist. He has been seen for a bone spurr in his right heel and he is still having problems with it.  Please advise  [phone]

## 2015-08-27 NOTE — Telephone Encounter (Signed)
Expand All Collapse All   Patient ID: Evan Lopez, male DOB: Jan 31, 1941 Age: 75 y.o. MRN: [id number]  Chief Complaint  Patient presents with  . Foot Injury    right heel, a few weeks  . Knee Pain    right    Subjective:   75 year old man is been having pain in his right heel for the last 3 weeks or so. No specific injury. 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 He said over the last week his medial aspect of the right knee is been hurting him.  Current allergies, medications, problem list, past/family and social histories reviewed.  Objective:  BP 110/68 mmHg  Pulse 75  Temp(Src) 98.3 F (36.8 C)  Resp 16  Ht 5\' 6"  (1.676 m)  Wt 155 lb (70.308 kg)  BMI 25.03 kg/m2  SpO2 98%  No major acute distress. Good range of motion of the knee. No crepitance. He is tender on the medial collateral ligament. He has heel is tender on the medial aspect of the right heel extending upward to posterior to the malleolus. Not significantly tender on the base of the calcaneus itself, but mediolaterally there is tenderness.  Assessment & Plan:   Assessment: 1. Heel pain, right   2. Right knee pain   3. Tendonitis of ankle or foot       Plan: X-rays of heel and knee  X-rays were reviewed by me and also have been reviewed by the radiologist and have no major problems. There is minimal calcium depositing which is probably early spur formation but not really a spur yet.  Orders Placed This Encounter  Procedures  . DG Knee Complete 4 Views Right    Order Specific Question:  Reason for Exam (SYMPTOM OR DIAGNOSIS REQUIRED)    Answer:  medial knee pain, no injury    Order Specific Question:  Preferred imaging location?    Answer:  External  . DG Os [place]cis Right    Order Specific Question:  Reason for Exam (SYMPTOM OR DIAGNOSIS REQUIRED)    Answer:  right heel pain    Order Specific  Question:  Preferred imaging location?    Answer:  External    Meds ordered this encounter  Medications  . meloxicam (MOBIC) 7.5 MG tablet    Sig: Take 1 tablet (7.5 mg total) by mouth daily.    Dispense: 20 tablet    Refill: 0         Patient Instructions  Take meloxicam 7.5 mg 1 daily with food  Do not take naproxen while on the meloxicam. When she finished the meloxicam you can use the naproxen again if needed  Wear a heel cup as directed  If you continue to have a lot of trouble with pain may need to refer you to an orthopedic doctor, but let's give it several weeks with the cup to see how you do         Podiatry or ortho?

## 2015-08-30 ENCOUNTER — Other Ambulatory Visit: Payer: Self-pay | Admitting: Family Medicine

## 2015-08-30 DIAGNOSIS — M7730 Calcaneal spur, unspecified foot: Secondary | ICD-10-CM

## 2015-08-30 NOTE — Telephone Encounter (Signed)
Refer to [facility]

## 2015-08-30 NOTE — Telephone Encounter (Signed)
LMOVM the referral has been put in for [facility]. Call clinic if he does not hear from them in the next couple days.

## 2015-08-30 NOTE — Telephone Encounter (Signed)
The patient came by the office to ask if we would put him in a referral for the bone spur in his foot.  He said he doesn't want to wait much longer because his foot is causing him a lot of pain.  Please advise.  CB#: [phone]  Placing as high priority, as the patient called 3 days ago and had not received an answer.

## 2015-09-04 ENCOUNTER — Telehealth: Payer: Self-pay

## 2015-09-04 NOTE — Telephone Encounter (Signed)
Pt is checking on the status of referral to foot doc.  Please advise  [phone]

## 2015-09-11 NOTE — Telephone Encounter (Signed)
Spoke to patient this morning and informed him that we sent his referral to [facility].

## 2016-01-28 ENCOUNTER — Ambulatory Visit: Payer: Commercial Managed Care - HMO | Admitting: Physician Assistant

## 2016-01-28 VITALS — BP 110/68 | HR 68 | Temp 98.2°F | Resp 18 | Ht 66.0 in | Wt 157.0 lb

## 2016-01-28 DIAGNOSIS — Z23 Encounter for immunization: Secondary | ICD-10-CM

## 2016-01-28 DIAGNOSIS — K5730 Diverticulosis of large intestine without perforation or abscess without bleeding: Secondary | ICD-10-CM | POA: Diagnosis not present

## 2016-01-28 MED ORDER — AMOXICILLIN-POT CLAVULANATE 875-125 MG PO TABS: 1.0000 | ORAL_TABLET | Freq: Two times a day (BID) | ORAL | 0 refills | Status: AC

## 2016-01-28 NOTE — Patient Instructions (Addendum)
Thank you for coming in today. I hope you feel we met your needs.  Feel free to call [facility] if you have any questions or further requests.  Please consider signing up for MyChart if you do not already have it, as this is a great way to communicate with me.  Best,  Whitney McVey, PA-C     IF you received an x-ray today, you will receive an invoice from [facility]. Please contact [facility] at [phone] with questions or concerns regarding your invoice.   IF you received labwork today, you will receive an invoice from [employer]. Please contact [facility] at [phone] with questions or concerns regarding your invoice.   Our billing staff will not be able to assist you with questions regarding bills from these companies.  You will be contacted with the lab results as soon as they are available. The fastest way to get your results is to activate your My Chart account. Instructions are located on the last page of this paperwork. If you have not heard from [place] regarding the results in 2 weeks, please contact this office.     Diverticulosis Diverticulosis is the condition that develops when small pouches (diverticula) form in the wall of your colon. Your colon, or large intestine, is where water is absorbed and stool is formed. The pouches form when the inside layer of your colon pushes through weak spots in the outer layers of your colon. CAUSES  No one knows exactly what causes diverticulosis. RISK FACTORS  Being older than 68. Your risk for this condition increases with age. Diverticulosis is rare in people younger than 40 years. By age 11, almost everyone has it.  Eating a low-fiber diet.  Being frequently constipated.  Being overweight.  Not getting enough exercise.  Smoking.  Taking over-the-counter pain medicines, like aspirin and ibuprofen. SYMPTOMS  Most people with diverticulosis do not have symptoms. DIAGNOSIS  Because  diverticulosis often has no symptoms, health care providers often discover the condition during an exam for other colon problems. In many cases, a health care provider will diagnose diverticulosis while using a flexible scope to examine the colon (colonoscopy). TREATMENT  If you have never developed an infection related to diverticulosis, you may not need treatment. If you have had an infection before, treatment may include:  Eating more fruits, vegetables, and grains.  Taking a fiber supplement.  Taking a live bacteria supplement (probiotic).  Taking medicine to relax your colon. HOME CARE INSTRUCTIONS   Drink at least 6-8 glasses of water each day to prevent constipation.  Try not to strain when you have a bowel movement.  Keep all follow-up appointments. If you have had an infection before:  Increase the fiber in your diet as directed by your health care provider or dietitian.  Take a dietary fiber supplement if your health care provider approves.  Only take medicines as directed by your health care provider. SEEK MEDICAL CARE IF:   You have abdominal pain.  You have bloating.  You have cramps.  You have not gone to the bathroom in 3 days. SEEK IMMEDIATE MEDICAL CARE IF:   Your pain gets worse.  Yourbloating becomes very bad.  You have a fever or chills, and your symptoms suddenly get worse.  You begin vomiting.  You have bowel movements that are bloody or black. MAKE SURE YOU:  Understand these instructions.  Will watch your condition.  Will get help right away if you are not doing well or  get worse.   This information is not intended to replace advice given to you by your health care provider. Make sure you discuss any questions you have with your health care provider.   Document Released: 02/06/2004 Document Revised: 05/16/2013 Document Reviewed: 04/05/2013 Elsevier Interactive Patient Education [employer].

## 2016-01-28 NOTE — Progress Notes (Signed)
   Evan Lopez  MRN: [id number] DOB: [birth year]  PCP: Kennon Portela, MD  Subjective:  Pt is a 75 year old male, history of diverticulosis and GERD, presents to clinic for abdominal pain in his left lower belly for three weeks. States it feels like he is about to have a diverticulitis flare - has had pains like this in the past which have usually resolved on its own. + Decreased appetite. Pain does not radiate. Worse when he presses on his left lower belly.  His bowel movements are usually normal, but recently along with his pain, has noticed bouts of diarrhea then constipation. Resolves by itself.  Has not taken anything for his pain.  Denies fever.    Diverticulosis x 5 years.  Treated in the past with Cipro and Flagyl, no relief. Treated last year by Dr. Amedeo Plenty at [facility] with Augmentin, experienced relief.   Review of Systems  Constitutional: Negative for appetite change, chills, diaphoresis, fatigue and fever.  Respiratory: Negative.   Cardiovascular: Negative.   Gastrointestinal: Positive for abdominal pain (LLQ) and diarrhea (Occasional ). Negative for abdominal distention, blood in stool, constipation and vomiting.    Patient Active Problem List   Diagnosis Date Noted  . Chest pain 05/13/2012  . Syncope 05/13/2012  . Hyperlipemia 06/24/2011  . 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 GERD (gastroesophageal reflux disease) 06/24/2011  . 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 NONSPECIFIC ABNORMAL FINDING IN STOOL CONTENTS 02/04/2009    Current Outpatient Prescriptions on File Prior to Visit  Medication Sig Dispense Refill  . aspirin 81 MG tablet Take 81 mg by mouth daily.    Marland Kitchen omeprazole (PRILOSEC) 20 MG capsule Take 20 mg by mouth 2 (two) times daily.     No current facility-administered medications on file prior to visit.     No Known Allergies  Objective:  BP 110/68 (BP Location: Right Arm, Patient Position: Sitting, Cuff Size: Small)    Pulse 68   Temp 98.2 F (36.8 C) (Oral)   Resp 18   Ht 5\' 6"  (1.676 m)   Wt 157 lb (71.2 kg)   SpO2 99%   BMI 25.34 kg/m   Physical Exam  Constitutional: He is oriented to person, place, and time and well-developed, well-nourished, and in no distress. No distress.  Cardiovascular: Normal rate, regular rhythm and normal heart sounds.   Abdominal: Soft. Normal appearance and bowel sounds are normal. He exhibits no distension. There is tenderness in the left lower quadrant. There is no rigidity, no rebound and no guarding.  Neurological: He is alert and oriented to person, place, and time. GCS score is 15.  Skin: Skin is warm and dry.  Psychiatric: Mood, memory, affect and judgment normal.  Vitals reviewed.   Personally reviewed past Images Colonoscopy 2016. Showed diverticuli of sigmoid colon, otherwise normal.  CT abdomen 05/2015, diverticuli, no abscess, otherwise normal   Assessment and Plan :  1. Diverticulosis of colon without hemorrhage - amoxicillin-clavulanate (AUGMENTIN) 875-125 MG tablet; Take 1 tablet by mouth 2 (two) times daily.  Dispense: 20 tablet; Refill: 0 - Printed off patient information about diverticulosis. Encouraged patient to remain active and eating a healthy diet. RTC if symptoms do not improve.   Mercer Pod, PA-C  [facility] 01/28/2016 5:36 PM

## 2016-02-11 ENCOUNTER — Ambulatory Visit (INDEPENDENT_AMBULATORY_CARE_PROVIDER_SITE_OTHER): Payer: Commercial Managed Care - HMO | Admitting: Urgent Care

## 2016-02-11 ENCOUNTER — Ambulatory Visit (INDEPENDENT_AMBULATORY_CARE_PROVIDER_SITE_OTHER): Payer: Commercial Managed Care - HMO

## 2016-02-11 VITALS — BP 122/70 | HR 75 | Temp 98.0°F | Resp 18 | Ht 66.0 in | Wt 157.0 lb

## 2016-02-11 DIAGNOSIS — R935 Abnormal findings on diagnostic imaging of other abdominal regions, including retroperitoneum: Secondary | ICD-10-CM | POA: Diagnosis not present

## 2016-02-11 DIAGNOSIS — K5790 Diverticulosis of intestine, part unspecified, without perforation or abscess without bleeding: Secondary | ICD-10-CM | POA: Diagnosis not present

## 2016-02-11 DIAGNOSIS — K5900 Constipation, unspecified: Secondary | ICD-10-CM

## 2016-02-11 DIAGNOSIS — R109 Unspecified abdominal pain: Secondary | ICD-10-CM | POA: Diagnosis not present

## 2016-02-11 LAB — COMPLETE METABOLIC PANEL WITH GFR
ALBUMIN: 3.9 g/dL (ref 3.6–5.1)
ALK PHOS: 80 U/L (ref 40–115)
ALT: 16 U/L (ref 9–46)
AST: 19 U/L (ref 10–35)
BUN: 10 mg/dL (ref 7–25)
CO2: 25 mmol/L (ref 20–31)
Calcium: 8.9 mg/dL (ref 8.6–10.3)
Chloride: 108 mmol/L (ref 98–110)
Creat: 1 mg/dL (ref 0.70–1.18)
GFR, EST NON AFRICAN AMERICAN: 74 mL/min (ref >=60)
GFR, Est African American: 86 mL/min (ref >=60)
GLUCOSE: 69 mg/dL (ref 65–99)
POTASSIUM: 4.3 mmol/L (ref 3.5–5.3)
SODIUM: 142 mmol/L (ref 135–146)
Total Bilirubin: 0.7 mg/dL (ref 0.2–1.2)
Total Protein: 6.5 g/dL (ref 6.1–8.1)

## 2016-02-11 LAB — POCT CBC
GRANULOCYTE PERCENT: 65.6 % (ref 37–80)
HEMATOCRIT: 40.5 % — AB (ref 43.5–53.7)
HEMOGLOBIN: 14.6 g/dL (ref 14.1–18.1)
Lymph, poc: 1.6 (ref 0.6–3.4)
MCH: 30.6 pg (ref 27–31.2)
MCHC: 36.1 g/dL — AB (ref 31.8–35.4)
MCV: 84.9 fL (ref 80–97)
MID (cbc): 0.5 (ref 0–0.9)
MPV: 7.2 fL (ref 0–99.8)
POC GRANULOCYTE: 4 (ref 2–6.9)
POC LYMPH PERCENT: 26 %L (ref 10–50)
POC MID %: 8.4 %M (ref 0–12)
Platelet Count, POC: 198 10*3/uL (ref 142–424)
RBC: 4.76 M/uL (ref 4.69–6.13)
RDW, POC: 12.9 %
WBC: 6.1 10*3/uL (ref 4.6–10.2)

## 2016-02-11 LAB — POCT URINALYSIS DIP (MANUAL ENTRY)
BILIRUBIN UA: NEGATIVE
Blood, UA: NEGATIVE
Glucose, UA: NEGATIVE
Ketones, POC UA: NEGATIVE
LEUKOCYTES UA: NEGATIVE
NITRITE UA: NEGATIVE
PH UA: 5.5
PROTEIN UA: NEGATIVE
Spec Grav, UA: 1.015
Urobilinogen, UA: 0.2

## 2016-02-11 LAB — POC MICROSCOPIC URINALYSIS (UMFC)

## 2016-02-11 MED ORDER — POLYETHYLENE GLYCOL 3350 17 GM/SCOOP PO POWD: 17.0000 g | Freq: Every day | ORAL | 1 refills | Status: DC | PRN

## 2016-02-11 MED ORDER — DOCUSATE SODIUM 50 MG PO CAPS: 50.0000 mg | ORAL_CAPSULE | Freq: Two times a day (BID) | ORAL | 0 refills | Status: DC

## 2016-02-11 NOTE — Patient Instructions (Addendum)
Please use Miralax for moderate to severe constipation. Take this once a day for the next 2-3 days. Please also start docusate stool softener, twice a day for at least 1 week. If stools become loose, cut down to once a day for another week. If stools remain loose, cut back to 1 pill every other day for a third week. You can stop docusate thereafter and resume as needed for constipation.  To help reduce constipation and promote bowel health: 1. Drink at least 64 ounces of water each day 2. Eat plenty of fiber (fruits, vegetables, whole grains, legumes) 3. Be physically active or exercise including walking, jogging, swimming, yoga, etc. 4. For active constipation use a stool softener (docusate) or an osmotic laxative (like Miralax) each day, or as needed.    Constipation, Adult Constipation is when a person has fewer than three bowel movements a week, has difficulty having a bowel movement, or has stools that are dry, hard, or larger than normal. As people grow older, constipation is more common. A low-fiber diet, not taking in enough fluids, and taking certain medicines may make constipation worse.  CAUSES   Certain medicines, such as antidepressants, pain medicine, iron supplements, antacids, and water pills.   Certain diseases, such as diabetes, irritable bowel syndrome (IBS), thyroid disease, or depression.   Not drinking enough water.   Not eating enough fiber-rich foods.   Stress or travel.   Lack of physical activity or exercise.   Ignoring the urge to have a bowel movement.   Using laxatives too much.  SIGNS AND SYMPTOMS   Having fewer than three bowel movements a week.   Straining to have a bowel movement.   Having stools that are hard, dry, or larger than normal.   Feeling full or bloated.   Pain in the lower abdomen.   Not feeling relief after having a bowel movement.  DIAGNOSIS  Your health care provider will take a medical history and perform a  physical exam. Further testing may be done for severe constipation. Some tests may include:  A barium enema X-ray to examine your rectum, colon, and, sometimes, your small intestine.   A sigmoidoscopy to examine your lower colon.   A colonoscopy to examine your entire colon. TREATMENT  Treatment will depend on the severity of your constipation and what is causing it. Some dietary treatments include drinking more fluids and eating more fiber-rich foods. Lifestyle treatments may include regular exercise. If these diet and lifestyle recommendations do not help, your health care provider may recommend taking over-the-counter laxative medicines to help you have bowel movements. Prescription medicines may be prescribed if over-the-counter medicines do not work.  HOME CARE INSTRUCTIONS   Eat foods that have a lot of fiber, such as fruits, vegetables, whole grains, and beans.  Limit foods high in fat and processed sugars, such as french fries, hamburgers, cookies, candies, and soda.   A fiber supplement may be added to your diet if you cannot get enough fiber from foods.   Drink enough fluids to keep your urine clear or pale yellow.   Exercise regularly or as directed by your health care provider.   Go to the restroom when you have the urge to go. Do not hold it.   Only take over-the-counter or prescription medicines as directed by your health care provider. Do not take other medicines for constipation without talking to your health care provider first.  [place] IF:   You have bright red blood  in your stool.   Your constipation lasts for more than 4 days or gets worse.   You have abdominal or rectal pain.   You have thin, pencil-like stools.   You have unexplained weight loss. MAKE SURE YOU:   Understand these instructions.  Will watch your condition.  Will get help right away if you are not doing well or get worse.   This information is not intended  to replace advice given to you by your health care provider. Make sure you discuss any questions you have with your health care provider.   Document Released: 02/07/2004 Document Revised: 06/01/2014 Document Reviewed: 02/20/2013 Elsevier Interactive Patient Education 2016 [employer].     Diverticulosis Diverticulosis is the condition that develops when small pouches (diverticula) form in the wall of your colon. Your colon, or large intestine, is where water is absorbed and stool is formed. The pouches form when the inside layer of your colon pushes through weak spots in the outer layers of your colon. CAUSES  No one knows exactly what causes diverticulosis. RISK FACTORS  Being older than 51. Your risk for this condition increases with age. Diverticulosis is rare in people younger than 40 years. By age 14, almost everyone has it.  Eating a low-fiber diet.  Being frequently constipated.  Being overweight.  Not getting enough exercise.  Smoking.  Taking over-the-counter pain medicines, like aspirin and ibuprofen. SYMPTOMS  Most people with diverticulosis do not have symptoms. DIAGNOSIS  Because diverticulosis often has no symptoms, health care providers often discover the condition during an exam for other colon problems. In many cases, a health care provider will diagnose diverticulosis while using a flexible scope to examine the colon (colonoscopy). TREATMENT  If you have never developed an infection related to diverticulosis, you may not need treatment. If you have had an infection before, treatment may include:  Eating more fruits, vegetables, and grains.  Taking a fiber supplement.  Taking a live bacteria supplement (probiotic).  Taking medicine to relax your colon. HOME CARE INSTRUCTIONS   Drink at least 6-8 glasses of water each day to prevent constipation.  Try not to strain when you have a bowel movement.  Keep all follow-up appointments. If you have had an  infection before:  Increase the fiber in your diet as directed by your health care provider or dietitian.  Take a dietary fiber supplement if your health care provider approves.  Only take medicines as directed by your health care provider. SEEK MEDICAL CARE IF:   You have abdominal pain.  You have bloating.  You have cramps.  You have not gone to the bathroom in 3 days. SEEK IMMEDIATE MEDICAL CARE IF:   Your pain gets worse.  Yourbloating becomes very bad.  You have a fever or chills, and your symptoms suddenly get worse.  You begin vomiting.  You have bowel movements that are bloody or black. MAKE SURE YOU:  Understand these instructions.  Will watch your condition.  Will get help right away if you are not doing well or get worse.   This information is not intended to replace advice given to you by your health care provider. Make sure you discuss any questions you have with your health care provider.   Document Released: 02/06/2004 Document Revised: 05/16/2013 Document Reviewed: 04/05/2013 Elsevier Interactive Patient Education [employer].     IF you received an x-ray today, you will receive an invoice from [facility]. Please contact [facility] at [phone] with questions  or concerns regarding your invoice.   IF you received labwork today, you will receive an invoice from [employer]. Please contact [facility] at [phone] with questions or concerns regarding your invoice.   Our billing staff will not be able to assist you with questions regarding bills from these companies.  You will be contacted with the lab results as soon as they are available. The fastest way to get your results is to activate your My Chart account. Instructions are located on the last page of this paperwork. If you have not heard from [place] regarding the results in 2 weeks, please contact this office.

## 2016-02-11 NOTE — Progress Notes (Signed)
MRN: [id number] DOB: Oct 10, 1940  Subjective:   Evan Lopez is a 75 y.o. male presenting for chief complaint of Follow-up (Was seen on 09/05 for diverticulosis. Pt. is still tender on left side)  Was last seen on 01/28/2016 for the same, was prescribed Augmentin for 10 days. Patient has a history of diverticulosis. Reports ongoing left-sided belly pain, constipation albeit he feels somewhat improved with Augmentin. Today, he reports pain is mild, worse with bending, pressing the belly. Denies fever, n/v, bloody stools, diarrhea, dysuria, hematuria. Denies smoking cigarettes or drinking alcohol.   Evan Lopez has a current medication list which includes the following prescription(s): aspirin and omeprazole. Also has No Known Allergies.  Evan Lopez  has a past medical history of DIVERTICULOSIS OF COLON; GERD (gastroesophageal reflux disease); Hyperlipidemia; Kidney calculi; and Nonspecific abnormal finding in stool contents. Also  has a past surgical history that includes Appendectomy.  Objective:   Vitals: BP 122/70   Pulse 75   Temp 98 F (36.7 C) (Oral)   Resp 18   Ht 5\' 6"  (1.676 m)   Wt 157 lb (71.2 kg)   SpO2 98%   BMI 25.34 kg/m   Physical Exam  Constitutional: He is oriented to person, place, and time. He appears well-developed and well-nourished.  HENT:  Mouth/Throat: Oropharynx is clear and moist.  Eyes: No scleral icterus.  Neck: Normal range of motion. Neck supple.  Cardiovascular: Normal rate, regular rhythm and intact distal pulses.  Exam reveals no gallop and no friction rub.   No murmur heard. Pulmonary/Chest: No respiratory distress. He has no wheezes. He has no rales.  Abdominal: Soft. Bowel sounds are normal. He exhibits no distension and no mass. There is tenderness (lower abdomen). There is no guarding.  No CVA tenderness.  Lymphadenopathy:    He has no cervical adenopathy.  Neurological: He is alert and oriented to person, place, and time.  Skin: Skin is  warm and dry.   Results for orders placed or performed in visit on 02/11/16 (from the past 24 hour(s))  POCT CBC     Status: Abnormal   Collection Time: 02/11/16 10:30 AM  Result Value Ref Range   WBC 6.1 4.6 - 10.2 K/uL   Lymph, poc 1.6 0.6 - 3.4   POC LYMPH PERCENT 26.0 10 - 50 %L   MID (cbc) 0.5 0 - 0.9   POC MID % 8.4 0 - 12 %M   POC Granulocyte 4.0 2 - 6.9   Granulocyte percent 65.6 37 - 80 %G   RBC 4.76 4.69 - 6.13 M/uL   Hemoglobin 14.6 14.1 - 18.1 g/dL   HCT, POC 40.5 (A) 43.5 - 53.7 %   MCV 84.9 80 - 97 fL   MCH, POC 30.6 27 - 31.2 pg   MCHC 36.1 (A) 31.8 - 35.4 g/dL   RDW, POC 12.9 %   Platelet Count, POC 198 142 - 424 K/uL   MPV 7.2 0 - 99.8 fL   Dg Abd 1 View  Result Date: 02/11/2016 CLINICAL DATA:  Left lower abdominal pain for 2 months EXAM: ABDOMEN - 1 VIEW COMPARISON:  06/03/2015 FINDINGS: Punctate calcification projects over the upper pole of the left kidney, likely small nonobstructing left renal stone. No other visible calcifications. Large stool burden within the colon. No obstruction or free air. No organomegaly. No acute bony abnormality. IMPRESSION: Suspect punctate left upper pole nephrolithiasis. Large stool burden. Electronically Signed   By: Rolm Baptise M.D.   On: 02/11/2016 10:42  Assessment and Plan :   1. Left sided abdominal pain 2. Constipation, unspecified constipation type - Patient's primary source of abdominal pain is likely constipation. I counseled him on risks of poor diet. He is to start Miralax, docusate. I will hold off on additional antibiotics given x-ray findings and physical exam. Otherwise see #3 below. Symptoms warranting recheck were discussed with patient. He verbalized understanding.  3. Diverticulosis of intestine without bleeding, unspecified intestinal tract location - I do not suspect that he has diverticulitis, counseled patient as above. However, if he does not have any improvement over the next week, consider CT abd,  course of cipro with Flagyl.  4. Abnormal x-ray of abdomen - Counseled patient on possibility of nephrolithiasis as a source of his symptoms. He will hydrate aggressively, rtc if symptoms worsen. Consider CT abd renal protocol.  Jaynee Eagles, PA-C [facility] [phone] 02/11/2016 10:15 AM

## 2016-02-24 ENCOUNTER — Ambulatory Visit (INDEPENDENT_AMBULATORY_CARE_PROVIDER_SITE_OTHER): Payer: Commercial Managed Care - HMO | Admitting: Urgent Care

## 2016-02-24 VITALS — BP 120/72 | HR 71 | Temp 98.2°F | Resp 16 | Ht 66.0 in | Wt 158.2 lb

## 2016-02-24 DIAGNOSIS — K5790 Diverticulosis of intestine, part unspecified, without perforation or abscess without bleeding: Secondary | ICD-10-CM | POA: Diagnosis not present

## 2016-02-24 DIAGNOSIS — K5900 Constipation, unspecified: Secondary | ICD-10-CM | POA: Diagnosis not present

## 2016-02-24 DIAGNOSIS — R109 Unspecified abdominal pain: Secondary | ICD-10-CM | POA: Diagnosis not present

## 2016-02-24 NOTE — Progress Notes (Addendum)
Patient ID: Evan Lopez, male   DOB: Apr 08, 1941, 75 y.o.   MRN: [id number]   By signing my name below, I, Essence Howell, attest that this documentation has been prepared under the direction and in the presence of Jaynee Eagles, [place].  Electronically Signed: Ladene Artist, ED Scribe 02/24/2016 at 8:20 AM.  MRN: [id number] DOB: [birth year]  Subjective:   Evan Lopez is a 75 y.o. male presenting for follow up on abdominal pain. Last visit was 02/09/16 seen for the same. Ws to manage symptoms for constipation with Miralax and docusate. Today he reports that constipation has improved significantly but he still reports intermittent episodes of mild left sided abdominal pain when he presses on it. Pt states that he has changed his diet since he was last seen and has been eating more fruits. He denies fever, bloody stools, nausea or vomiting within the last week. Pt reports a h/o diverticulitis and had a CT several years ago.    [name] has a current medication list which includes the following prescription(s): aspirin, docusate sodium, omeprazole, and polyethylene glycol powder. Also has No Known Allergies.  [name]  has a past medical history of DIVERTICULOSIS OF COLON; GERD (gastroesophageal reflux disease); Hyperlipidemia; Kidney calculi; and Nonspecific abnormal finding in stool contents. Also  has a past surgical history that includes Appendectomy.  Objective:   Vitals: BP 120/72 (BP Location: Left Arm, Patient Position: Sitting, Cuff Size: Small)    Pulse 71    Temp 98.2 F (36.8 C) (Oral)    Resp 16    Ht 5\' 6"  (1.676 m)    Wt 158 lb 3.2 oz (71.8 kg)    SpO2 99%    BMI 25.53 kg/m   Physical Exam  Constitutional: He is oriented to person, place, and time. He appears well-developed and well-nourished.  Cardiovascular: Normal rate, regular rhythm and intact distal pulses.  Exam reveals no gallop and no friction rub.   No murmur heard. Pulmonary/Chest: No respiratory distress. He has no  wheezes. He has no rales.  Abdominal: Soft. Bowel sounds are normal. He exhibits no distension and no mass. There is tenderness (mild mid-left sided with deep palptation). There is no guarding.  Neurological: He is alert and oriented to person, place, and time.  Skin: Skin is warm and dry.   Assessment and Plan :   1. Left sided abdominal pain 2. Constipation, unspecified constipation type 3. Diverticulosis of intestine without bleeding, unspecified intestinal tract location - Counseled on diagnosis of diverticulosis. Patient is significantly improved. Continue with dietary modifications. Will hold off on belly CT for now. Advised that if patient's belly pain returns despite lifestyle modifications and constipation management, we will pursue belly CT with or without antibiotic coverage for diverticulitis. Patient agreed.  Jaynee Eagles, PA-C [facility] [phone] 02/24/2016 8:17 AM

## 2016-02-24 NOTE — Patient Instructions (Addendum)
Diverticulosis Diverticulosis is the condition that develops when small pouches (diverticula) form in the wall of your colon. Your colon, or large intestine, is where water is absorbed and stool is formed. The pouches form when the inside layer of your colon pushes through weak spots in the outer layers of your colon. CAUSES  No one knows exactly what causes diverticulosis. RISK FACTORS  Being older than 78. Your risk for this condition increases with age. Diverticulosis is rare in people younger than 40 years. By age 6, almost everyone has it.  Eating a low-fiber diet.  Being frequently constipated.  Being overweight.  Not getting enough exercise.  Smoking.  Taking over-the-counter pain medicines, like aspirin and ibuprofen. SYMPTOMS  Most people with diverticulosis do not have symptoms. DIAGNOSIS  Because diverticulosis often has no symptoms, health care providers often discover the condition during an exam for other colon problems. In many cases, a health care provider will diagnose diverticulosis while using a flexible scope to examine the colon (colonoscopy). TREATMENT  If you have never developed an infection related to diverticulosis, you may not need treatment. If you have had an infection before, treatment may include:  Eating more fruits, vegetables, and grains.  Taking a fiber supplement.  Taking a live bacteria supplement (probiotic).  Taking medicine to relax your colon. HOME CARE INSTRUCTIONS   Drink at least 6-8 glasses of water each day to prevent constipation.  Try not to strain when you have a bowel movement.  Keep all follow-up appointments. If you have had an infection before:  Increase the fiber in your diet as directed by your health care provider or dietitian.  Take a dietary fiber supplement if your health care provider approves.  Only take medicines as directed by your health care provider. SEEK MEDICAL CARE IF:   You have abdominal  pain.  You have bloating.  You have cramps.  You have not gone to the bathroom in 3 days. SEEK IMMEDIATE MEDICAL CARE IF:   Your pain gets worse.  Yourbloating becomes very bad.  You have a fever or chills, and your symptoms suddenly get worse.  You begin vomiting.  You have bowel movements that are bloody or black. MAKE SURE YOU:  Understand these instructions.  Will watch your condition.  Will get help right away if you are not doing well or get worse.   This information is not intended to replace advice given to you by your health care provider. Make sure you discuss any questions you have with your health care provider.   Document Released: 02/06/2004 Document Revised: 05/16/2013 Document Reviewed: 04/05/2013 Elsevier Interactive Patient Education [employer].     IF you received an x-ray today, you will receive an invoice from [facility]. Please contact [facility] at [phone] with questions or concerns regarding your invoice.   IF you received labwork today, you will receive an invoice from [employer]. Please contact [facility] at [phone] with questions or concerns regarding your invoice.   Our billing staff will not be able to assist you with questions regarding bills from these companies.  You will be contacted with the lab results as soon as they are available. The fastest way to get your results is to activate your My Chart account. Instructions are located on the last page of this paperwork. If you have not heard from [place] regarding the results in 2 weeks, please contact this office.

## 2016-08-10 DIAGNOSIS — L570 Actinic keratosis: Secondary | ICD-10-CM | POA: Diagnosis not present

## 2016-09-27 DIAGNOSIS — J029 Acute pharyngitis, unspecified: Secondary | ICD-10-CM | POA: Diagnosis not present

## 2016-09-27 DIAGNOSIS — J019 Acute sinusitis, unspecified: Secondary | ICD-10-CM | POA: Diagnosis not present

## 2017-01-07 ENCOUNTER — Ambulatory Visit (INDEPENDENT_AMBULATORY_CARE_PROVIDER_SITE_OTHER): Payer: PPO | Admitting: Physician Assistant

## 2017-01-07 ENCOUNTER — Encounter: Payer: Self-pay | Admitting: Physician Assistant

## 2017-01-07 VITALS — BP 150/71 | HR 59 | Temp 97.6°F | Resp 18 | Ht 66.93 in | Wt 156.8 lb

## 2017-01-07 DIAGNOSIS — Z0000 Encounter for general adult medical examination without abnormal findings: Secondary | ICD-10-CM

## 2017-01-07 DIAGNOSIS — Z130 Encounter for screening for diseases of the blood and blood-forming organs and certain disorders involving the immune mechanism: Secondary | ICD-10-CM | POA: Diagnosis not present

## 2017-01-07 DIAGNOSIS — Z131 Encounter for screening for diabetes mellitus: Secondary | ICD-10-CM

## 2017-01-07 DIAGNOSIS — Z23 Encounter for immunization: Secondary | ICD-10-CM

## 2017-01-07 DIAGNOSIS — K219 Gastro-esophageal reflux disease without esophagitis: Secondary | ICD-10-CM

## 2017-01-07 DIAGNOSIS — Z13220 Encounter for screening for lipoid disorders: Secondary | ICD-10-CM

## 2017-01-07 DIAGNOSIS — Z1329 Encounter for screening for other suspected endocrine disorder: Secondary | ICD-10-CM

## 2017-01-07 DIAGNOSIS — K5730 Diverticulosis of large intestine without perforation or abscess without bleeding: Secondary | ICD-10-CM | POA: Diagnosis not present

## 2017-01-07 LAB — POCT URINALYSIS DIP (MANUAL ENTRY)
Bilirubin, UA: NEGATIVE
Blood, UA: NEGATIVE
Glucose, UA: NEGATIVE mg/dL
Ketones, POC UA: NEGATIVE mg/dL
Leukocytes, UA: NEGATIVE
Nitrite, UA: NEGATIVE
Protein Ur, POC: NEGATIVE mg/dL
Spec Grav, UA: 1.025 (ref 1.010–1.025)
Urobilinogen, UA: 0.2 U/dL
pH, UA: 5.5 (ref 5.0–8.0)

## 2017-01-07 NOTE — Progress Notes (Signed)
   Subjective:    Patient ID: Evan Lopez, male    DOB: 1940-08-05, 76 y.o.   MRN: [id number]  HPI    Review of Systems  HENT: Positive for sinus pressure.   Gastrointestinal: Positive for abdominal pain and diarrhea.  Musculoskeletal: Positive for arthralgias.  Allergic/Immunologic: Positive for environmental allergies.       Objective:   Physical Exam        Assessment & Plan:

## 2017-01-07 NOTE — Patient Instructions (Addendum)
Make an appointment for a routine cleaning of your teeth.  Come back to evaluate your shoulder. Try heat and/or ice to make your shoulder feel better.  Put ice in a plastic bag. Place a towel between your skin and the bag or between your plaster splint and the bag. Leave the ice on for 20 minutes, 2-3 times a day. Ibuprofen and/or Tylenol for pain. Do this 2-3 times a day until swelling improves.   Thank you for coming in today. I hope you feel we met your needs.  Feel free to call PCP if you have any questions or further requests.  Please consider signing up for MyChart if you do not already have it, as this is a great way to communicate with me.  Best,  Mercer Pod, PA-C   Health Maintenance, Male A healthy lifestyle and preventive care is important for your health and wellness. Ask your health care provider about what schedule of regular examinations is right for you. What should I know about weight and diet? Eat a Healthy Diet  Eat plenty of vegetables, fruits, whole grains, low-fat dairy products, and lean protein.  Do not eat a lot of foods high in solid fats, added sugars, or salt.  Maintain a Healthy Weight Regular exercise can help you achieve or maintain a healthy weight. You should:  Do at least 150 minutes of exercise each week. The exercise should increase your heart rate and make you sweat (moderate-intensity exercise).  Do strength-training exercises at least twice a week.  Watch Your Levels of Cholesterol and Blood Lipids  Have your blood tested for lipids and cholesterol every 5 years starting at 74 years of age. If you are at high risk for heart disease, you should start having your blood tested when you are 74 years old. You may need to have your cholesterol levels checked more often if: ? Your lipid or cholesterol levels are high. ? You are older than 74 years of age. ? You are at high risk for heart disease.  What should I know about cancer screening? Many  types of cancers can be detected early and may often be prevented. Lung Cancer  You should be screened every year for lung cancer if: ? You are a current smoker who has smoked for at least 30 years. ? You are a former smoker who has quit within the past 15 years.  Talk to your health care provider about your screening options, when you should start screening, and how often you should be screened.  Colorectal Cancer  Routine colorectal cancer screening usually begins at 74 years of age and should be repeated every 5-10 years until you are 74 years old. You may need to be screened more often if early forms of precancerous polyps or small growths are found. Your health care provider may recommend screening at an earlier age if you have risk factors for colon cancer.  Your health care provider may recommend using home test kits to check for hidden blood in the stool.  A small camera at the end of a tube can be used to examine your colon (sigmoidoscopy or colonoscopy). This checks for the earliest forms of colorectal cancer.  Prostate and Testicular Cancer  Depending on your age and overall health, your health care provider may do certain tests to screen for prostate and testicular cancer.  Talk to your health care provider about any symptoms or concerns you have about testicular or prostate cancer.  Skin Cancer  Check  your skin from head to toe regularly.  Tell your health care provider about any new moles or changes in moles, especially if: ? There is a change in a mole's size, shape, or color. ? You have a mole that is larger than a pencil eraser.  Always use sunscreen. Apply sunscreen liberally and repeat throughout the day.  Protect yourself by wearing long sleeves, pants, a wide-brimmed hat, and sunglasses when outside.  What should I know about heart disease, diabetes, and high blood pressure?  If you are 2-61 years of age, have your blood pressure checked every 3-5 years. If  you are 73 years of age or older, have your blood pressure checked every year. You should have your blood pressure measured twice-once when you are at a hospital or clinic, and once when you are not at a hospital or clinic. Record the average of the two measurements. To check your blood pressure when you are not at a hospital or clinic, you can use: ? An automated blood pressure machine at a pharmacy. ? A home blood pressure monitor.  Talk to your health care provider about your target blood pressure.  If you are between 29-76 years old, ask your health care provider if you should take aspirin to prevent heart disease.  Have regular diabetes screenings by checking your fasting blood sugar level. ? If you are at a normal weight and have a low risk for diabetes, have this test once every three years after the age of 41. ? If you are overweight and have a high risk for diabetes, consider being tested at a younger age or more often.  A one-time screening for abdominal aortic aneurysm (AAA) by ultrasound is recommended for men aged 14-75 years who are current or former smokers. What should I know about preventing infection? Hepatitis B If you have a higher risk for hepatitis B, you should be screened for this virus. Talk with your health care provider to find out if you are at risk for hepatitis B infection. Hepatitis C Blood testing is recommended for:  Everyone born from [birth year] through 1963.  Anyone with known risk factors for hepatitis C.  Sexually Transmitted Diseases (STDs)  You should be screened each year for STDs including gonorrhea and chlamydia if: ? You are sexually active and are younger than 74 years of age. ? You are older than 74 years of age and your health care provider tells you that you are at risk for this type of infection. ? Your sexual activity has changed since you were last screened and you are at an increased risk for chlamydia or gonorrhea. Ask your health care  provider if you are at risk.  Talk with your health care provider about whether you are at high risk of being infected with HIV. Your health care provider may recommend a prescription medicine to help prevent HIV infection.  What else can I do?  Schedule regular health, dental, and eye exams.  Stay current with your vaccines (immunizations).  Do not use any tobacco products, such as cigarettes, chewing tobacco, and e-cigarettes. If you need help quitting, ask your health care provider.  Limit alcohol intake to no more than 2 drinks per day. One drink equals 12 ounces of beer, 5 ounces of wine, or 1 ounces of hard liquor.  Do not use street drugs.  Do not share needles.  Ask your health care provider for help if you need support or information about quitting drugs.  Tell your  health care provider if you often feel depressed.  Tell your health care provider if you have ever been abused or do not feel safe at home. This information is not intended to replace advice given to you by your health care provider. Make sure you discuss any questions you have with your health care provider. Document Released: 11/07/2007 Document Revised: 01/08/2016 Document Reviewed: 02/12/2015 Elsevier Interactive Patient Education  2018 [employer].    IF you received an x-ray today, you will receive an invoice from [facility]. Please contact [facility] at [phone] with questions or concerns regarding your invoice.   IF you received labwork today, you will receive an invoice from [facility]. Please contact [facility] at [phone] with questions or concerns regarding your invoice.   Our billing staff will not be able to assist you with questions regarding bills from these companies.  You will be contacted with the lab results as soon as they are available. The fastest way to get your results is to activate your My Chart account. Instructions are located on the last page of this  paperwork. If you have not heard from [place] regarding the results in 2 weeks, please contact this office.     Pneumococcal Conjugate Vaccine suspension for injection What is this medicine? PNEUMOCOCCAL VACCINE (NEU mo KOK al vak SEEN) is a vaccine used to prevent pneumococcus bacterial infections. These bacteria can cause serious infections like pneumonia, meningitis, and blood infections. This vaccine will lower your chance of getting pneumonia. If you do get pneumonia, it can make your symptoms milder and your illness shorter. This vaccine will not treat an infection and will not cause infection. This vaccine is recommended for infants and young children, adults with certain medical conditions, and adults 56 years or older. This medicine may be used for other purposes; ask your health care provider or pharmacist if you have questions. COMMON BRAND NAME(S): Prevnar, Prevnar 13 What should I tell my health care provider before I take this medicine? They need to know if you have any of these conditions: -bleeding problems -fever -immune system problems -an unusual or allergic reaction to pneumococcal vaccine, diphtheria toxoid, other vaccines, latex, other medicines, foods, dyes, or preservatives -pregnant or trying to get pregnant -breast-feeding How should I use this medicine? This vaccine is for injection into a muscle. It is given by a health care professional. A copy of Vaccine Information Statements will be given before each vaccination. Read this sheet carefully each time. The sheet may change frequently. Talk to your pediatrician regarding the use of this medicine in children. While this drug may be prescribed for children as young as 51 weeks old for selected conditions, precautions do apply. Overdosage: If you think you have taken too much of this medicine contact a poison control center or emergency room at once. NOTE: This medicine is only for you. Do not share this medicine with  others. What if I miss a dose? It is important not to miss your dose. Call your doctor or health care professional if you are unable to keep an appointment. What may interact with this medicine? -medicines for cancer chemotherapy -medicines that suppress your immune function -steroid medicines like prednisone or cortisone This list may not describe all possible interactions. Give your health care provider a list of all the medicines, herbs, non-prescription drugs, or dietary supplements you use. Also tell them if you smoke, drink alcohol, or use illegal drugs. Some items may interact with your medicine. What should I watch  for while using this medicine? Mild fever and pain should go away in 3 days or less. Report any unusual symptoms to your doctor or health care professional. What side effects may I notice from receiving this medicine? Side effects that you should report to your doctor or health care professional as soon as possible: -allergic reactions like skin rash, itching or hives, swelling of the face, lips, or tongue -breathing problems -confused -fast or irregular heartbeat -fever over 102 degrees F -seizures -unusual bleeding or bruising -unusual muscle weakness Side effects that usually do not require medical attention (report to your doctor or health care professional if they continue or are bothersome): -aches and pains -diarrhea -fever of 102 degrees F or less -headache -irritable -loss of appetite -pain, tender at site where injected -trouble sleeping This list may not describe all possible side effects. Call your doctor for medical advice about side effects. You may report side effects to FDA at 1-800-FDA-1088. Where should I keep my medicine? This does not apply. This vaccine is given in a clinic, pharmacy, doctor's office, or other health care setting and will not be stored at home. NOTE: This sheet is a summary. It may not cover all possible information. If you have  questions about this medicine, talk to your doctor, pharmacist, or health care provider.  2018 Elsevier/Gold Standard (2014-02-15 10:27:27)

## 2017-01-07 NOTE — Progress Notes (Signed)
Primary Care at [place], [place] [address] [phone]  Date:  01/07/2017   Name:  Evan Lopez   DOB:  [birth year]   MRN:  [id number]  PCP:  Orma Flaming, MD    Chief Complaint: Annual Exam   History of Present Illness:  This is a 76 y.o. male with PMH diverticulitis, GERD, HLD, Kidney disease who is presenting for CPE. 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  He is fasting today. He is here today with his wife of 49 years.   HLD - has been on medication to lower lipids several years ago. Is no longer taking medications. Has not had lipids checked in "quite a while".   GERD - controlled with OTC Omeprazole.   Diverticulitis - colonoscopy 4 years ago. Negative. C/o occasional LLQ pain with occasional bouts of diarrhea. Denies blood in stool, fever, chills, night sweats.   Complaints:  Right shoulder pain x 1 year. Stretches in the mornings. Does not use heat/ice. Has received injections in the past. Denies reduced ROM, muscle weakness, n/t.  Immunizations: needs PNA-13 Dentist: not recently.  Eye: wears glasses. 20/20 b/l.  Diet: chicken, red meat, vegetables. Eats out a few times a week.   Exercise: none Fam hx: mother: pancreatic cancer; father alcohol abuse.  Sexual hx: active with wife only Urinary hesitancy/frequency or nocturia: wakes up once a night to urinate.  Problems with erectile dysfunction:  Tobacco/alcohol/substance use: never smoker, no alcohol, no drug use.   Colonoscopy: 2014 for diverticulitis. Negative.    Review of Systems:  Review of Systems  Constitutional: Negative for activity change, appetite change and fatigue.  HENT: Negative for congestion, dental problem, sneezing and tinnitus.   Eyes: Negative for visual disturbance.  Respiratory: Negative for cough, chest tightness, shortness of breath and wheezing.   Cardiovascular: Negative for chest pain, palpitations and leg swelling.  Gastrointestinal: Positive for abdominal  pain and diarrhea. Negative for anal bleeding, blood in stool, constipation, nausea and vomiting.  Endocrine: Negative for polydipsia, polyphagia and polyuria.  Genitourinary: Negative for decreased urine volume, difficulty urinating, discharge, hematuria, scrotal swelling and testicular pain.  Musculoskeletal: Negative for arthralgias, back pain and neck stiffness.  Allergic/Immunologic: Negative for environmental allergies and food allergies.  Neurological: Negative for dizziness, syncope, weakness, light-headedness and headaches.  Psychiatric/Behavioral: Negative for sleep disturbance. The patient is not nervous/anxious.     Patient Active Problem List   Diagnosis Date Noted  . Chest pain 05/13/2012  . Syncope 05/13/2012  . Hyperlipemia 06/24/2011  . 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 GERD (gastroesophageal reflux disease) 06/24/2011  . 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 NONSPECIFIC ABNORMAL FINDING IN STOOL CONTENTS 02/04/2009    Prior to Admission medications   Medication Sig Start Date End Date Taking? Authorizing Provider  omeprazole (PRILOSEC) 20 MG capsule Take 20 mg by mouth 2 (two) times daily.    [provider]    No Known Allergies  Past Surgical History:  Procedure Laterality Date  . APPENDECTOMY      Social History  Substance Use Topics  . Smoking status: Never Smoker  . Smokeless tobacco: Never Used  . Alcohol use No    Family History  Problem Relation Age of Onset  . Cancer Mother   . Alcohol abuse Father     Medication list has been reviewed and updated.  Physical Examination:  Physical Exam  Constitutional: He is oriented to person, place, and time. He  appears well-developed and well-nourished. No distress.  HENT:  Head: Normocephalic and atraumatic.  Right Ear: External ear normal.  Left Ear: External ear normal.  Nose: Nose normal.  Mouth/Throat: No oropharyngeal exudate.  Eyes: Pupils are equal, round, and  reactive to light. Conjunctivae and EOM are normal.  Neck: Normal range of motion. No thyromegaly present.  Cardiovascular: Normal rate, regular rhythm, normal heart sounds and intact distal pulses.   No murmur heard. Pulmonary/Chest: Effort normal and breath sounds normal. No respiratory distress. He has no wheezes.  Abdominal: Soft. Normal appearance and bowel sounds are normal. He exhibits no distension and no mass. There is no hepatosplenomegaly. There is tenderness in the epigastric area and left lower quadrant. No hernia.  Musculoskeletal: Normal range of motion. He exhibits no edema.  Lymphadenopathy:    He has no cervical adenopathy.  Neurological: He is alert and oriented to person, place, and time. He has normal reflexes.  Skin: Skin is warm and dry.  Psychiatric: He has a normal mood and affect. His behavior is normal. Judgment and thought content normal.  Vitals reviewed.   BP (!) 150/71   Pulse (!) 59   Temp 97.6 F (36.4 C) (Oral)   Resp 18   Ht 5' 6.93" (1.7 m)   Wt 156 lb 12.8 oz (71.1 kg)   SpO2 98%   BMI 24.61 kg/m    Assessment and Plan: 1. Annual physical exam - Anticipatory guidance provided. Encouraged increased exercise and improving his diet.  Labs are pending, will contact with results. Advanced directive materials discussed and provided. RTC in 1 year for annual exam, or sooner to evaluate shoulder pain.   2. Gastroesophageal reflux disease without esophagitis - Controlled with OTC omeprazole BID.  3. Diverticulosis of colon without hemorrhage - No concern today for diverticulitis. RTC if symptoms worsen.  4. Need for pneumococcal vaccination - Pneumococcal conjugate vaccine 13-valent  5. Screening, lipid - Lipid panel  6. Screening, anemia, deficiency, iron - CBC with Differential/Platelet  7. Screening for diabetes mellitus - POCT urinalysis dipstick - CMP14+EGFR  8. Screening for thyroid disorder - TSH  Mercer Pod, PA-C  Primary Care  at [place] 01/07/2017 9:08 AM

## 2017-01-07 NOTE — Progress Notes (Signed)
Ann

## 2017-01-08 ENCOUNTER — Encounter: Payer: Self-pay | Admitting: Physician Assistant

## 2017-01-08 LAB — CMP14+EGFR
ALT: 12 IU/L (ref 0–44)
AST: 18 IU/L (ref 0–40)
Albumin/Globulin Ratio: 1.7 (ref 1.2–2.2)
Albumin: 4.6 g/dL (ref 3.5–4.8)
Alkaline Phosphatase: 109 IU/L (ref 39–117)
BUN/Creatinine Ratio: 14 (ref 10–24)
BUN: 14 mg/dL (ref 8–27)
Bilirubin Total: 0.7 mg/dL (ref 0.0–1.2)
CO2: 23 mmol/L (ref 20–29)
Calcium: 9.7 mg/dL (ref 8.6–10.2)
Chloride: 105 mmol/L (ref 96–106)
Creatinine, Ser: 1.01 mg/dL (ref 0.76–1.27)
GFR calc Af Amer: 84 mL/min/{1.73_m2} (ref >59)
GFR calc non Af Amer: 73 mL/min/{1.73_m2} (ref >59)
Globulin, Total: 2.7 g/dL (ref 1.5–4.5)
Glucose: 98 mg/dL (ref 65–99)
Potassium: 4.6 mmol/L (ref 3.5–5.2)
Sodium: 141 mmol/L (ref 134–144)
Total Protein: 7.3 g/dL (ref 6.0–8.5)

## 2017-01-08 LAB — CBC WITH DIFFERENTIAL/PLATELET
Basophils Absolute: 0 10*3/uL (ref 0.0–0.2)
Basos: 0 %
EOS (ABSOLUTE): 0.2 10*3/uL (ref 0.0–0.4)
Eos: 3 %
Hematocrit: 43.9 % (ref 37.5–51.0)
Hemoglobin: 14.7 g/dL (ref 13.0–17.7)
Immature Grans (Abs): 0 10*3/uL (ref 0.0–0.1)
Immature Granulocytes: 1 %
Lymphocytes Absolute: 1.6 10*3/uL (ref 0.7–3.1)
Lymphs: 25 %
MCH: 29.1 pg (ref 26.6–33.0)
MCHC: 33.5 g/dL (ref 31.5–35.7)
MCV: 87 fL (ref 79–97)
Monocytes Absolute: 0.4 10*3/uL (ref 0.1–0.9)
Monocytes: 6 %
Neutrophils Absolute: 4.2 10*3/uL (ref 1.4–7.0)
Neutrophils: 65 %
Platelets: 217 10*3/uL (ref 150–379)
RBC: 5.06 x10E6/uL (ref 4.14–5.80)
RDW: 14 % (ref 12.3–15.4)
WBC: 6.3 10*3/uL (ref 3.4–10.8)

## 2017-01-08 LAB — LIPID PANEL
Chol/HDL Ratio: 3.7 ratio (ref 0.0–5.0)
Cholesterol, Total: 208 mg/dL — ABNORMAL HIGH (ref 100–199)
HDL: 56 mg/dL (ref >39)
LDL Calculated: 137 mg/dL — ABNORMAL HIGH (ref 0–99)
Triglycerides: 74 mg/dL (ref 0–149)
VLDL Cholesterol Cal: 15 mg/dL (ref 5–40)

## 2017-01-08 LAB — TSH: TSH: 1.9 u[IU]/mL (ref 0.450–4.500)

## 2017-01-08 NOTE — Progress Notes (Signed)
Please call pt and let him know his kidneys, liver, salts in the blood, blood counts, and thyroid all came back normal. Your LDL cholesterol level is high at 137. This is up from 128 last year - and 52 two years ago. Having an elevated LDL cholesterol puts your heart at greater risk for a cardiac event. I'd like for you to increase the amount you are exercising and improve your diet and come back and see me in 6 months for a recheck of these levels. You will receive a letter in the mail with the results.  Thank you!

## 2017-01-11 ENCOUNTER — Ambulatory Visit (INDEPENDENT_AMBULATORY_CARE_PROVIDER_SITE_OTHER): Payer: PPO

## 2017-01-11 ENCOUNTER — Encounter: Payer: Self-pay | Admitting: Physician Assistant

## 2017-01-11 ENCOUNTER — Ambulatory Visit (INDEPENDENT_AMBULATORY_CARE_PROVIDER_SITE_OTHER): Payer: PPO | Admitting: Physician Assistant

## 2017-01-11 VITALS — BP 127/70 | HR 76 | Temp 98.1°F | Resp 18 | Ht 66.93 in | Wt 158.2 lb

## 2017-01-11 DIAGNOSIS — M25512 Pain in left shoulder: Secondary | ICD-10-CM

## 2017-01-11 DIAGNOSIS — M62830 Muscle spasm of back: Secondary | ICD-10-CM

## 2017-01-11 DIAGNOSIS — M19012 Primary osteoarthritis, left shoulder: Secondary | ICD-10-CM

## 2017-01-11 MED ORDER — NAPROXEN SODIUM 550 MG PO TABS: ORAL_TABLET | ORAL | 4 refills | Status: DC

## 2017-01-11 NOTE — Progress Notes (Signed)
Evan Lopez  MRN: [id number] DOB: 14-Sep-1940  PCP: Orma Flaming, MD  Subjective:  Pt is a 76 year old male who presents to clinic for left shoulder pain x several years. C/o pain and "tightness" above his left shoulder blade. C/o pain front of left shoulder. Pain comes and goes depending on his activity level.  Pain does not radiate.  He has been seen here multiple times over the past several years for this problems. He has received corticosteroid injections in the past - this helped a lot. Last injection 01/2015. Takes Anaprox every once in a while - this helps some. Has not tried heat or ice. He stretches every morning.  Denies n/t, muscle weakness, decreased ROM, shoulder feeling "stuck", fever, chills.   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 Lifting and carrying the heavier parts induces his pain.    Review of Systems  Respiratory: Negative for cough.   Cardiovascular: Negative for chest pain and palpitations.  Musculoskeletal: Positive for arthralgias (left shoulder). Negative for myalgias.  Skin: Negative.   Neurological: Negative for weakness and numbness.  Psychiatric/Behavioral: Negative for sleep disturbance.    Patient Active Problem List   Diagnosis Date Noted  . Chest pain 05/13/2012  . Syncope 05/13/2012  . Hyperlipemia 06/24/2011  . 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 GERD (gastroesophageal reflux disease) 06/24/2011  . 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 NONSPECIFIC ABNORMAL FINDING IN STOOL CONTENTS 02/04/2009    Current Outpatient Prescriptions on File Prior to Visit  Medication Sig Dispense Refill  . omeprazole (PRILOSEC) 20 MG capsule Take 20 mg by mouth 2 (two) times daily.     No current facility-administered medications on file prior to visit.     No Known Allergies   Objective:  BP 127/70   Pulse 76   Temp 98.1 F (36.7 C) (Oral)   Resp 18   Ht 5' 6.93" (1.7 m)   Wt 158 lb 3.2 oz (71.8 kg)   SpO2 97%    BMI 24.83 kg/m   Physical Exam  Constitutional: He is oriented to person, place, and time and well-developed, well-nourished, and in no distress. No distress.  Cardiovascular: Normal rate, regular rhythm and normal heart sounds.   Musculoskeletal:       Left shoulder: He exhibits tenderness and spasm. He exhibits normal range of motion, no bony tenderness, no swelling, no crepitus, no deformity and normal strength.       Cervical back: He exhibits spasm.       Back:  Neurological: He is alert and oriented to person, place, and time. He has normal strength and normal reflexes. He displays no weakness. GCS score is 15.  Skin: Skin is warm and dry.  Psychiatric: Mood, memory, affect and judgment normal.  Vitals reviewed.  DG Shoulder Left 01/2014 IMPRESSION: 1. No acute findings. 2. Moderate degenerate change of the left glenohumeral joint. 3. Mild degenerative change of the left AC joint.  Dg Shoulder Left  Result Date: 01/11/2017 CLINICAL DATA:  Shoulder pain.  History of arthritis . EXAM: LEFT SHOULDER - 2+ VIEW COMPARISON:  Scratched Left shoulder CE 02/11/2014 . FINDINGS: Mild acromioclavicular scratched it acromioclavicular glenohumeral degenerative change again noted. Glenohumeral degenerative change appears to progressed. No evidence of fracture or dislocation. Soft tissue structures are unremarkable. IMPRESSION: Acromioclavicular and glenohumeral degenerative change with progression of glenohumeral degenerative change. No acute abnormality identified. Electronically Signed   By: Marcello Moores  Register   On:  01/11/2017 09:52   A trigger point injection was performed at the site of maximal tenderness using 1% plain Lidocaine. This was well tolerated, and followed by mild relief of pain.  Assessment and Plan :  1. Arthropathy of left shoulder 2. Pain in joint of left shoulder 3. Muscle spasm of back - DG Shoulder Left; Future - Ambulatory referral to Orthopedic Surgery - naproxen  sodium (ANAPROX) 550 MG tablet; TAKE 1 TABLET BY MOUTH 2 TIMES DAILY WITH A MEAL.  Dispense: 60 tablet; Refill: 4 - Trigger point injections performed without complication. Today's imaging shows progression of glenohumeral degenerative changes. Will refer to ortho for evaluation and possible injections, as requested by the pt. He understands and agrees with the plan.   Mercer Pod, PA-C  [facility] 01/11/2017 9:04 AM

## 2017-01-11 NOTE — Patient Instructions (Addendum)
Your shoulder x-ray shows a progression of degenerative changes in your shoulder joint. Con't stretching, applying heat and taking Naproxen as needed for pain. You will receive a phone call to schedule an appointment with orthopedics.   Thank you for coming in today. I hope you feel we met your needs.  Feel free to call PCP if you have any questions or further requests.  Please consider signing up for MyChart if you do not already have it, as this is a great way to communicate with me.  Best,  Whitney McVey, PA-C  Trigger Point Injection Trigger points are areas where you have pain. A trigger point injection is a shot given in the trigger point to help relieve pain for a few days to a few months. Common places for trigger points include:  The neck.  The shoulders.  The upper back.  The lower back.  A trigger point injection will not cure long-lasting (chronic) pain permanently. These injections do not always work for every person, but for some people they can help to relieve pain for a few days to a few months. Tell a health care provider about:  Any allergies you have.  All medicines you are taking, including vitamins, herbs, eye drops, creams, and over-the-counter medicines.  Any problems you or family members have had with anesthetic medicines.  Any blood disorders you have.  Any surgeries you have had.  Any medical conditions you have. What are the risks? Generally, this is a safe procedure. However, problems may occur, including:  Infection.  Bleeding.  Allergic reaction to the injected medicine.  Irritation of the skin around the injection site.  What happens before the procedure?  Ask your health care provider about changing or stopping your regular medicines. This is especially important if you are taking diabetes medicines or blood thinners. What happens during the procedure?  Your health care provider will feel for trigger points. A marker may be used to  circle the area for the injection.  The skin over the trigger point will be washed with a germ-killing (antiseptic) solution.  A thin needle is used for the shot. You may feel pain or a twitching feeling when the needle enters the trigger point.  A numbing solution may be injected into the trigger point. Sometimes a medicine to keep down swelling, redness, and warmth (inflammation) is also injected.  Your health care provider may move the needle around the area where the trigger point is located until the tightness and twitching goes away.  After the injection, your health care provider may put gentle pressure over the injection site.  The injection site will be covered with a bandage (dressing). The procedure may vary among health care providers and hospitals. What happens after the procedure?  The dressing can be taken off in a few hours or as told by your health care provider.  You may feel sore and stiff for 1-2 days. This information is not intended to replace advice given to you by your health care provider. Make sure you discuss any questions you have with your health care provider. Document Released: 04/30/2011 Document Revised: 01/12/2016 Document Reviewed: 10/29/2014 Elsevier Interactive Patient Education  2018 [employer].   IF you received an x-ray today, you will receive an invoice from [facility]. Please contact [facility] at [phone] with questions or concerns regarding your invoice.   IF you received labwork today, you will receive an invoice from [facility]. Please contact [facility] at [phone] with questions or concerns  regarding your invoice.   Our billing staff will not be able to assist you with questions regarding bills from these companies.  You will be contacted with the lab results as soon as they are available. The fastest way to get your results is to activate your My Chart account. Instructions are located on the last page of this  paperwork. If you have not heard from [place] regarding the results in 2 weeks, please contact this office.

## 2017-01-19 ENCOUNTER — Ambulatory Visit (INDEPENDENT_AMBULATORY_CARE_PROVIDER_SITE_OTHER): Payer: PPO | Admitting: Orthopaedic Surgery

## 2017-01-19 ENCOUNTER — Encounter (INDEPENDENT_AMBULATORY_CARE_PROVIDER_SITE_OTHER): Payer: Self-pay | Admitting: Orthopaedic Surgery

## 2017-01-19 VITALS — BP 132/78 | HR 72 | Resp 14 | Ht 69.0 in | Wt 165.0 lb

## 2017-01-19 DIAGNOSIS — M19012 Primary osteoarthritis, left shoulder: Secondary | ICD-10-CM

## 2017-01-19 MED ORDER — BUPIVACAINE HCL 0.5 % IJ SOLN
2.0000 mL | INTRAMUSCULAR | Status: AC | PRN
  Administered 2017-01-19: 2 mL via INTRA_ARTICULAR

## 2017-01-19 MED ORDER — METHYLPREDNISOLONE ACETATE 40 MG/ML IJ SUSP
80.0000 mg | INTRAMUSCULAR | Status: AC | PRN
  Administered 2017-01-19: 80 mg

## 2017-01-19 MED ORDER — LIDOCAINE HCL 1 % IJ SOLN
2.0000 mL | INTRAMUSCULAR | Status: AC | PRN
  Administered 2017-01-19: 2 mL

## 2017-01-19 NOTE — Progress Notes (Signed)
Office Visit Note   Patient: Evan Lopez           Date of Birth: 24-Apr-1941           MRN: [id number] Visit Date: 01/19/2017              Requested by: Dorise Hiss, PA-C [address] [place], [place] [address] PCP: Orma Flaming, MD   Assessment & Plan: Visit Diagnoses:  1. Primary osteoarthritis, left shoulder     Plan: Long discussion regarding this diagnosis based on x-rays. We discussed different treatment options including exercises, cortisone injection and shoulder replacement. He would like to proceed with a cortisone injection.  Follow-Up Instructions: Return if symptoms worsen or fail to improve.   Orders:  No orders of the defined types were placed in this encounter.  No orders of the defined types were placed in this encounter.     Procedures: Large Joint Inj Date/Time: 01/19/2017 4:29 PM Performed by: Garald Balding Authorized by: Garald Balding   Consent Given by:  Patient Timeout: prior to procedure the correct patient, procedure, and site was verified   Indications:  Pain Location:  Shoulder Site:  L glenohumeral Prep: patient was prepped and draped in usual sterile fashion   Needle Size:  25 G Needle Length:  1.5 inches Approach:  Posterior Ultrasound Guidance: No   Fluoroscopic Guidance: No   Arthrogram: No   Medications:  2 mL lidocaine 1 %; 2 mL bupivacaine 0.5 %; 80 mg methylPREDNISolone acetate 40 MG/ML Aspiration Attempted: No   Patient tolerance:  Patient tolerated the procedure well with no immediate complications     Clinical Data: No additional findings.   Subjective: Chief Complaint  Patient presents with  . Left Shoulder - Pain, Weakness    Evan Lopez ia a 76 y o that presents with Left shoulder pain x 2 years. He has had cortisone in the past. Limited ROM overhead  Having some difficulty raising his arm over his head and even sleeping on that side. Denies any neck pain or problems with numbness and  tingling. No swelling distally. No obvious injury or trauma. Has been evaluated by his primary care physician in the past and has had "several cortisone injections" the last of which was about 2 years ago. Recent films were obtained through his primary care office of the left shoulder. I reviewed these on the PACS system obvious degenerative changes of the glenohumeral joint. There is cyst behind the glenoid with subchondral sclerosis. Narrowing of the space between the humeral head and glenoid. Inferior humeral head spur. Normal space between the humeral head and acromion. Mild degenerative changes at the acromioclavicular joint  HPI  Review of Systems  Constitutional: Negative for fatigue.  HENT: Negative for hearing loss.   Respiratory: Negative for apnea, chest tightness and shortness of breath.   Cardiovascular: Negative for chest pain, palpitations and leg swelling.  Gastrointestinal: Negative for blood in stool, constipation and diarrhea.  Genitourinary: Negative for difficulty urinating.  Musculoskeletal: Negative for arthralgias, back pain, joint swelling, myalgias, neck pain and neck stiffness.  Neurological: Negative for weakness, numbness and headaches.  Hematological: Does not bruise/bleed easily.  Psychiatric/Behavioral: Negative for sleep disturbance. The patient is not nervous/anxious.      Objective: Vital Signs: BP 132/78   Pulse 72   Resp 14   Ht 5\' 9"  (1.753 m)   Wt 165 lb (74.8 kg)   BMI 24.37 kg/m   Physical Exam  Ortho  Exam painful internal/external rotation of his left shoulder associated with limited overhead motion of about 120 passively. He is able to touch the lower part of his back. Skin intact. Biceps intact. Good grip and released distally. Neurovascular exam intact. No weakness. Could not elicit any grinding. Good strength with internal and external rotation. Painless range of motion of cervical spine.  Specialty Comments:  No specialty comments  available.  Imaging: No results found.   PMFS History: Patient Active Problem List   Diagnosis Date Noted  . Chest pain 05/13/2012  . Syncope 05/13/2012  . Hyperlipemia 06/24/2011  . 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 GERD (gastroesophageal reflux disease) 06/24/2011  . 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 NONSPECIFIC ABNORMAL FINDING IN STOOL CONTENTS 02/04/2009   Past Medical History:  Diagnosis Date  . DIVERTICULOSIS OF COLON   . GERD (gastroesophageal reflux disease)   . Hyperlipidemia   . Kidney calculi   . Nonspecific abnormal finding in stool contents     Family History  Problem Relation Age of Onset  . Cancer Mother   . Alcohol abuse Father     Past Surgical History:  Procedure Laterality Date  . APPENDECTOMY     Social History   Occupational History  . Not on file.   Social History Main Topics  . Smoking status: Never Smoker  . Smokeless tobacco: Never Used  . Alcohol use No  . Drug use: No  . Sexual activity: Yes

## 2017-03-10 ENCOUNTER — Ambulatory Visit (INDEPENDENT_AMBULATORY_CARE_PROVIDER_SITE_OTHER): Payer: PPO | Admitting: Physician Assistant

## 2017-03-10 ENCOUNTER — Encounter: Payer: Self-pay | Admitting: Physician Assistant

## 2017-03-10 VITALS — BP 110/72 | HR 75 | Temp 98.4°F | Resp 16 | Ht 66.0 in | Wt 158.4 lb

## 2017-03-10 DIAGNOSIS — K5732 Diverticulitis of large intestine without perforation or abscess without bleeding: Secondary | ICD-10-CM | POA: Diagnosis not present

## 2017-03-10 DIAGNOSIS — R1032 Left lower quadrant pain: Secondary | ICD-10-CM | POA: Diagnosis not present

## 2017-03-10 LAB — POCT CBC
Granulocyte percent: 70.3 %G (ref 37–80)
HCT, POC: 40.9 % — AB (ref 43.5–53.7)
Hemoglobin: 13.5 g/dL — AB (ref 14.1–18.1)
Lymph, poc: 1.3 (ref 0.6–3.4)
MCH, POC: 29.5 pg (ref 27–31.2)
MCHC: 33 g/dL (ref 31.8–35.4)
MCV: 89.5 fL (ref 80–97)
MID (cbc): 0.4 (ref 0–0.9)
MPV: 7.2 fL (ref 0–99.8)
POC Granulocyte: 4 (ref 2–6.9)
POC LYMPH PERCENT: 23.4 %L (ref 10–50)
POC MID %: 6.3 % (ref 0–12)
Platelet Count, POC: 209 10*3/uL (ref 142–424)
RBC: 4.57 M/uL — AB (ref 4.69–6.13)
RDW, POC: 13.4 %
WBC: 5.7 10*3/uL (ref 4.6–10.2)

## 2017-03-10 MED ORDER — AMOXICILLIN-POT CLAVULANATE 875-125 MG PO TABS: 1.0000 | ORAL_TABLET | Freq: Two times a day (BID) | ORAL | 0 refills | Status: DC

## 2017-03-10 NOTE — Progress Notes (Signed)
Evan Lopez  MRN: [id number] DOB: August 10, 1940  PCP: Orma Flaming, MD  Subjective:  Pt is a 76 year old male who presents to clinic for abdominal pain.  Abdominal pain LLQ off and on for six months. Endorses diarrhea for several days, then several days of normal bowel movements. This is not a new problem. H/o diverticulitis. Has experienced successful treatment with Augmentin. Has experienced unsuccessful treatments with Cipro and Flagyl.  Denies fever, chills, blood in stool, palpitations.  Colonoscopy 2014 shows diverticulitis.   Review of Systems  Constitutional: Negative for chills, diaphoresis, fatigue and fever.  Gastrointestinal: Positive for abdominal pain and diarrhea. Negative for abdominal distention, anal bleeding, blood in stool, constipation, nausea and rectal pain.    Patient Active Problem List   Diagnosis Date Noted  . Chest pain 05/13/2012  . Syncope 05/13/2012  . Hyperlipemia 06/24/2011  . 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 GERD (gastroesophageal reflux disease) 06/24/2011  . 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 NONSPECIFIC ABNORMAL FINDING IN STOOL CONTENTS 02/04/2009    Current Outpatient Prescriptions on File Prior to Visit  Medication Sig Dispense Refill  . naproxen sodium (ANAPROX) 550 MG tablet TAKE 1 TABLET BY MOUTH 2 TIMES DAILY WITH A MEAL. 60 tablet 4  . omeprazole (PRILOSEC) 20 MG capsule Take 20 mg by mouth 2 (two) times daily.     No current facility-administered medications on file prior to visit.     No Known Allergies   Objective:  BP 110/72   Pulse 75   Temp 98.4 F (36.9 C) (Oral)   Resp 16   Ht 5\' 6"  (1.676 m)   Wt 158 lb 6.4 oz (71.8 kg)   SpO2 97%   BMI 25.57 kg/m   Physical Exam  Constitutional: He is oriented to person, place, and time and well-developed, well-nourished, and in no distress. No distress.  Cardiovascular: Normal rate, regular rhythm and normal heart sounds.   Abdominal: Soft. Normal  appearance and bowel sounds are normal. There is tenderness in the right lower quadrant and left lower quadrant. There is no rigidity, no rebound and no guarding.  Neurological: He is alert and oriented to person, place, and time. GCS score is 15.  Skin: Skin is warm and dry.  Psychiatric: Mood, memory, affect and judgment normal.  Vitals reviewed.  Results for orders placed or performed in visit on 03/10/17  POCT CBC  Result Value Ref Range   WBC 5.7 4.6 - 10.2 K/uL   Lymph, poc 1.3 0.6 - 3.4   POC LYMPH PERCENT 23.4 10 - 50 %L   MID (cbc) 0.4 0 - 0.9   POC MID % 6.3 0 - 12 %M   POC Granulocyte 4.0 2 - 6.9   Granulocyte percent 70.3 37 - 80 %G   RBC 4.57 (A) 4.69 - 6.13 M/uL   Hemoglobin 13.5 (A) 14.1 - 18.1 g/dL   HCT, POC 40.9 (A) 43.5 - 53.7 %   MCV 89.5 80 - 97 fL   MCH, POC 29.5 27 - 31.2 pg   MCHC 33.0 31.8 - 35.4 g/dL   RDW, POC 13.4 %   Platelet Count, POC 209 142 - 424 K/uL   MPV 7.2 0 - 99.8 fL    Assessment and Plan :  1. Left lower quadrant pain 2. Diverticulitis of colon - POCT CBC - amoxicillin-clavulanate (AUGMENTIN) 875-125 MG tablet; Take 1 tablet by mouth 2 (two) times daily.  Dispense: 20 tablet; Refill:  0 - WBC count is wnl. 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 Discussed proper diet. RTC if no improvement. Consider imaging.   Mercer Pod, PA-C  [facility] 03/10/2017 3:06 PM

## 2017-03-10 NOTE — Patient Instructions (Addendum)
Start Augmentin for diverticulitis.   Back pain - see below for more stretching ideas.   Diverticulitis Diverticulitis is inflammation or infection of small pouches in your colon that form when you have a condition called diverticulosis. The pouches in your colon are called diverticula. Your colon, or large intestine, is where water is absorbed and stool is formed. Complications of diverticulitis can include:  Bleeding.  Severe infection.  Severe pain.  Perforation of your colon.  Obstruction of your colon.  What are the causes? Diverticulitis is caused by bacteria. Diverticulitis happens when stool becomes trapped in diverticula. This allows bacteria to grow in the diverticula, which can lead to inflammation and infection. What increases the risk? People with diverticulosis are at risk for diverticulitis. Eating a diet that does not include enough fiber from fruits and vegetables may make diverticulitis more likely to develop. What are the signs or symptoms? Symptoms of diverticulitis may include:  Abdominal pain and tenderness. The pain is normally located on the left side of the abdomen, but may occur in other areas.  Fever and chills.  Bloating.  Cramping.  Nausea.  Vomiting.  Constipation.  Diarrhea.  Blood in your stool.  How is this diagnosed? Your health care provider will ask you about your medical history and do a physical exam. You may need to have tests done because many medical conditions can cause the same symptoms as diverticulitis. Tests may include:  Blood tests.  Urine tests.  Imaging tests of the abdomen, including X-rays and CT scans.  When your condition is under control, your health care provider may recommend that you have a colonoscopy. A colonoscopy can show how severe your diverticula are and whether something else is causing your symptoms. How is this treated? Most cases of diverticulitis are mild and can be treated at home.  Treatment may include:  Taking over-the-counter pain medicines.  Following a clear liquid diet.  Taking antibiotic medicines by mouth for 7-10 days.  More severe cases may be treated at a hospital. Treatment may include:  Not eating or drinking.  Taking prescription pain medicine.  Receiving antibiotic medicines through an IV tube.  Receiving fluids and nutrition through an IV tube.  Surgery.  Follow these instructions at home:  Follow your health care provider's instructions carefully.  Follow a full liquid diet or other diet as directed by your health care provider. After your symptoms improve, your health care provider may tell you to change your diet. He or she may recommend you eat a high-fiber diet. Fruits and vegetables are good sources of fiber. Fiber makes it easier to pass stool.  Take fiber supplements or probiotics as directed by your health care provider.  Only take medicines as directed by your health care provider.  Keep all your follow-up appointments. Contact a health care provider if:  Your pain does not improve.  You have a hard time eating food.  Your bowel movements do not return to normal. Get help right away if:  Your pain becomes worse.  Your symptoms do not get better.  Your symptoms suddenly get worse.  You have a fever.  You have repeated vomiting.  You have bloody or black, tarry stools. This information is not intended to replace advice given to you by your health care provider. Make sure you discuss any questions you have with your health care provider. Document Released: 02/18/2005 Document Revised: 10/17/2015 Document Reviewed: 04/05/2013 Elsevier Interactive Patient Education  2017 [employer].   Low Back Strain  Rehab Ask your health care provider which exercises are safe for you. Do exercises exactly as told by your health care provider and adjust them as directed. It is normal to feel mild stretching, pulling, tightness, or  discomfort as you do these exercises, but you should stop right away if you feel sudden pain or your pain gets worse. Do not begin these exercises until told by your health care provider. Stretching and range of motion exercises These exercises warm up your muscles and joints and improve the movement and flexibility of your back. These exercises also help to relieve pain, numbness, and tingling. Exercise A: Single knee to chest  1. Lie on your back on a firm surface with both legs straight. 2. Bend one of your knees. Use your hands to move your knee up toward your chest until you feel a gentle stretch in your lower back and buttock. ? Hold your leg in this position by holding onto the front of your knee. ? Keep your other leg as straight as possible. 3. Hold for __________ seconds. 4. Slowly return to the starting position. 5. Repeat with your other leg. Repeat __________ times. Complete this exercise __________ times a day. Exercise B: Prone extension on elbows  1. Lie on your abdomen on a firm surface. 2. Prop yourself up on your elbows. 3. Use your arms to help lift your chest up until you feel a gentle stretch in your abdomen and your lower back. ? This will place some of your body weight on your elbows. If this is uncomfortable, try stacking pillows under your chest. ? Your hips should stay down, against the surface that you are lying on. Keep your hip and back muscles relaxed. 4. Hold for __________ seconds. 5. Slowly relax your upper body and return to the starting position. Repeat __________ times. Complete this exercise __________ times a day. Strengthening exercises These exercises build strength and endurance in your back. Endurance is the ability to use your muscles for a long time, even after they get tired. Exercise C: Pelvic tilt 1. Lie on your back on a firm surface. Bend your knees and keep your feet flat. 2. Tense your abdominal muscles. Tip your pelvis up toward the  ceiling and flatten your lower back into the floor. ? To help with this exercise, you may place a small towel under your lower back and try to push your back into the towel. 3. Hold for __________ seconds. 4. Let your muscles relax completely before you repeat this exercise. Repeat __________ times. Complete this exercise __________ times a day. Exercise D: Alternating arm and leg raises  1. Get on your hands and knees on a firm surface. If you are on a hard floor, you may want to use padding to cushion your knees, such as an exercise mat. 2. Line up your arms and legs. Your hands should be below your shoulders, and your knees should be below your hips. 3. Lift your left leg behind you. At the same time, raise your right arm and straighten it in front of you. ? Do not lift your leg higher than your hip. ? Do not lift your arm higher than your shoulder. ? Keep your abdominal and back muscles tight. ? Keep your hips facing the ground. ? Do not arch your back. ? Keep your balance carefully, and do not hold your breath. 4. Hold for __________ seconds. 5. Slowly return to the starting position and repeat with your right leg and your  left arm. Repeat __________ times. Complete this exercise __________times a day. Exercise J: Single leg lower with bent knees 1. Lie on your back on a firm surface. 2. Tense your abdominal muscles and lift your feet off the floor, one foot at a time, so your knees and hips are bent in an "L" shape (at about 90 degrees). ? Your knees should be over your hips and your lower legs should be parallel to the floor. 3. Keeping your abdominal muscles tense and your knee bent, slowly lower one of your legs so your toe touches the ground. 4. Lift your leg back up to return to the starting position. ? Do not hold your breath. ? Do not let your back arch. Keep your back flat against the ground. 5. Repeat with your other leg. Repeat __________ times. Complete this exercise  __________ times a day. Posture and body mechanics  Body mechanics refers to the movements and positions of your body while you do your daily activities. Posture is part of body mechanics. Good posture and healthy body mechanics can help to relieve stress in your body's tissues and joints. Good posture means that your spine is in its natural S-curve position (your spine is neutral), your shoulders are pulled back slightly, and your head is not tipped forward. The following are general guidelines for applying improved posture and body mechanics to your everyday activities. Standing   When standing, keep your spine neutral and your feet about hip-width apart. Keep a slight bend in your knees. Your ears, shoulders, and hips should line up.  When you do a task in which you stand in one place for a long time, place one foot up on a stable object that is 2-4 inches (5-10 cm) high, such as a footstool. This helps keep your spine neutral. Sitting   When sitting, keep your spine neutral and keep your feet flat on the floor. Use a footrest, if necessary, and keep your thighs parallel to the floor. Avoid rounding your shoulders, and avoid tilting your head forward.  When working at a desk or a computer, keep your desk at a height where your hands are slightly lower than your elbows. Slide your chair under your desk so you are close enough to maintain good posture.  When working at a computer, place your monitor at a height where you are looking straight ahead and you do not have to tilt your head forward or downward to look at the screen. Resting   When lying down and resting, avoid positions that are most painful for you.  If you have pain with activities such as sitting, bending, stooping, or squatting (flexion-based activities), lie in a position in which your body does not bend very much. For example, avoid curling up on your side with your arms and knees near your chest (fetal position).  If you  have pain with activities such as standing for a long time or reaching with your arms (extension-based activities), lie with your spine in a neutral position and bend your knees slightly. Try the following positions: ? Lying on your side with a pillow between your knees. ? Lying on your back with a pillow under your knees. Lifting   When lifting objects, keep your feet at least shoulder-width apart and tighten your abdominal muscles.  Bend your knees and hips and keep your spine neutral. It is important to lift using the strength of your legs, not your back. Do not lock your knees straight out.  Always ask for help to lift heavy or awkward objects. This information is not intended to replace advice given to you by your health care provider. Make sure you discuss any questions you have with your health care provider. Document Released: 05/11/2005 Document Revised: 01/16/2016 Document Reviewed: 02/20/2015 Elsevier Interactive Patient Education  [employer].   Thank you for coming in today. I hope you feel we met your needs.  Feel free to call PCP if you have any questions or further requests.  Please consider signing up for MyChart if you do not already have it, as this is a great way to communicate with me.  Best,  Whitney McVey, PA-C  IF you received an x-ray today, you will receive an invoice from [facility]. Please contact [facility] at [phone] with questions or concerns regarding your invoice.   IF you received labwork today, you will receive an invoice from [facility]. Please contact [facility] at [phone] with questions or concerns regarding your invoice.   Our billing staff will not be able to assist you with questions regarding bills from these companies.  You will be contacted with the lab results as soon as they are available. The fastest way to get your results is to activate your My Chart account. Instructions are located on the last page of  this paperwork. If you have not heard from [place] regarding the results in 2 weeks, please contact this office.

## 2017-03-26 ENCOUNTER — Telehealth: Payer: Self-pay | Admitting: Physician Assistant

## 2017-03-26 NOTE — Telephone Encounter (Signed)
Pt called trying to get an appointment with [facility], unfortunately his work schedule cannot meet up with hers so he is leaving a message for her.  He states that his stomach is still bothering him and he is unsure if the amoxicillin worked to help with his diverticulitis.  Please advise [phone]

## 2017-03-29 NOTE — Telephone Encounter (Signed)
He should come in and see a provider, even if it cannot be me. Thank you!

## 2017-03-30 NOTE — Telephone Encounter (Signed)
Please schedule

## 2017-06-04 ENCOUNTER — Other Ambulatory Visit: Payer: Self-pay | Admitting: *Deleted

## 2017-06-04 DIAGNOSIS — M25512 Pain in left shoulder: Secondary | ICD-10-CM

## 2017-06-04 MED ORDER — NAPROXEN SODIUM 550 MG PO TABS: ORAL_TABLET | ORAL | 0 refills | Status: DC

## 2017-08-26 DIAGNOSIS — D1801 Hemangioma of skin and subcutaneous tissue: Secondary | ICD-10-CM | POA: Diagnosis not present

## 2017-08-26 DIAGNOSIS — L570 Actinic keratosis: Secondary | ICD-10-CM | POA: Diagnosis not present

## 2017-08-26 DIAGNOSIS — D225 Melanocytic nevi of trunk: Secondary | ICD-10-CM | POA: Diagnosis not present

## 2017-09-06 DIAGNOSIS — H52223 Regular astigmatism, bilateral: Secondary | ICD-10-CM | POA: Diagnosis not present

## 2017-12-21 ENCOUNTER — Ambulatory Visit (INDEPENDENT_AMBULATORY_CARE_PROVIDER_SITE_OTHER): Payer: PPO | Admitting: Physician Assistant

## 2017-12-21 ENCOUNTER — Other Ambulatory Visit: Payer: Self-pay

## 2017-12-21 ENCOUNTER — Encounter: Payer: Self-pay | Admitting: Physician Assistant

## 2017-12-21 VITALS — BP 120/72 | HR 79 | Temp 98.6°F | Resp 16 | Ht 66.0 in | Wt 157.8 lb

## 2017-12-21 DIAGNOSIS — M62838 Other muscle spasm: Secondary | ICD-10-CM | POA: Diagnosis not present

## 2017-12-21 DIAGNOSIS — M5440 Lumbago with sciatica, unspecified side: Secondary | ICD-10-CM | POA: Diagnosis not present

## 2017-12-21 LAB — POCT URINALYSIS DIP (MANUAL ENTRY)
Bilirubin, UA: NEGATIVE
Blood, UA: NEGATIVE
Glucose, UA: NEGATIVE mg/dL
Ketones, POC UA: NEGATIVE mg/dL
Leukocytes, UA: NEGATIVE
Nitrite, UA: NEGATIVE
Protein Ur, POC: NEGATIVE mg/dL
Spec Grav, UA: 1.01 (ref 1.010–1.025)
Urobilinogen, UA: 0.2 E.U./dL
pH, UA: 6 (ref 5.0–8.0)

## 2017-12-21 MED ORDER — CYCLOBENZAPRINE HCL 10 MG PO TABS: 10.0000 mg | ORAL_TABLET | Freq: Three times a day (TID) | ORAL | 0 refills | Status: DC | PRN

## 2017-12-21 NOTE — Progress Notes (Signed)
Evan Lopez  MRN: [id number] DOB: Nov 19, 1940  PCP: Orma Flaming, MD  Subjective:  Pt is a 77 year old male with PMH diverticulitis, GERD, HLD, Kidney disease who presents to clinic for lower back pain x 6 months. Pain is of his lower left back and hip. Pain does not radiate.  Pain is on and off, "haven't picked up anything heavy that I can remember".  Pain is improving.  ROS below.   Review of Systems  Genitourinary: Negative for dysuria, enuresis, flank pain, frequency, hematuria and urgency.  Musculoskeletal: Positive for back pain. Negative for neck pain and neck stiffness.  Skin: Negative.   Neurological: Negative for weakness and numbness.    Patient Active Problem List   Diagnosis Date Noted  . Chest pain 05/13/2012  . Syncope 05/13/2012  . Hyperlipemia 06/24/2011  . 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 GERD (gastroesophageal reflux disease) 06/24/2011  . 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 NONSPECIFIC ABNORMAL FINDING IN STOOL CONTENTS 02/04/2009    Current Outpatient Medications on File Prior to Visit  Medication Sig Dispense Refill  . naproxen sodium (ANAPROX) 550 MG tablet TAKE 1 TABLET BY MOUTH 2 TIMES DAILY WITH A MEAL. 60 tablet 0  . omeprazole (PRILOSEC) 20 MG capsule Take 20 mg by mouth 2 (two) times daily.     No current facility-administered medications on file prior to visit.     No Known Allergies   Objective:  BP 120/72 (BP Location: Left Arm, Patient Position: Sitting, Cuff Size: Normal)   Pulse 79   Temp 98.6 F (37 C) (Oral)   Resp 16   Ht 5\' 6"  (1.676 m)   Wt 157 lb 12.8 oz (71.6 kg)   SpO2 98%   BMI 25.47 kg/m   Physical Exam  Constitutional: He is oriented to person, place, and time. He appears well-developed and well-nourished.  Musculoskeletal:       Lumbar back: He exhibits tenderness. He exhibits normal range of motion and no bony tenderness.       Back:  Tight band of muscle left low back, TTP. No bony  tenderness.   Neurological: He is alert and oriented to person, place, and time.  Skin: Skin is warm and dry.  Psychiatric: He has a normal mood and affect. His behavior is normal. Judgment and thought content normal.  Vitals reviewed.   Results for orders placed or performed in visit on 12/21/17  POCT urinalysis dipstick  Result Value Ref Range   Color, UA yellow yellow   Clarity, UA clear clear   Glucose, UA negative negative mg/dL   Bilirubin, UA negative negative   Ketones, POC UA negative negative mg/dL   Spec Grav, UA 1.010 1.010 - 1.025   Blood, UA negative negative   pH, UA 6.0 5.0 - 8.0   Protein Ur, POC negative negative mg/dL   Urobilinogen, UA 0.2 0.2 or 1.0 E.U./dL   Nitrite, UA Negative Negative   Leukocytes, UA Negative Negative    Assessment and Plan :  1. Muscle spasm - pt c/o lower left back pain x 6 months. UA negative, no bony tenderness, no red flags. Suspect MSK pain. Pt declines dry needling today. Plan to treat supportively with heat, muscle relaxer, hydration and massage. RTC in 4 weeks if no improvement.   - cyclobenzaprine (FLEXERIL) 10 MG tablet; Take 1 tablet (10 mg total) by mouth 3 (three) times daily as needed for muscle spasms.  Dispense: 30 tablet;  Refill: 0  2. Low back pain with sciatica, sciatica laterality unspecified, unspecified back pain laterality, unspecified chronicity - negative UA.  - POCT urinalysis dipstick   Mercer Pod, PA-C  [facility] 12/21/2017 3:17 PM  Please note: Portions of this report may have been transcribed using dragon voice recognition software. Every effort was made to ensure accuracy; however, inadvertent computerized transcription errors may be present.

## 2017-12-21 NOTE — Patient Instructions (Addendum)
Flexeril is a muscle relaxer. This may make you drowsy. Please take this only as directed.   Apply moist heat to the area. Wet a towel and wring it out so it is damp. Put it in the microwave for about 15-20 seconds - long enough to make it hot, but not too hot to apply to your skin causing burns. Do this for about 20-30 minutes, 3-4 times a day.   Perform gentle, light stretches 2-3 times a day.   Put a tennis ball between your back and a wall. Gentle massage the area with rolling the ball around the affected area. Try a foam roller or trigger point back massager.   Stay well hydrated - try to drink 32-64 oz/day.   If you feel like you need a new pillow, try "My Pillow" or [place].  Come back and see me if you would like to try a dry needling session. (see below).    Trigger Point Dry Needling   What is Trigger Point Dry Needling (DN)?   1. DN is a physical therapy technique used to treat muscle pain and Dysfunction.  Specifically, DN helps deactivate muscle trigger points (Muscle Knots).   2. A thin filiform needle is used to penetrate the skin and stimulate the underlying trigger point.  The goal is for a local twitch response (LTR) to occur and for the trigger point to relax.  No medication of any kind is injected during the procedure.   What Does Trigger Point Dry Needling Feel Like?   1. The procedures feels different for each individual patient.   Some patients report that they do not actually feel the needle enter the skin and overall the process is not  painful.  Very  mild bleeding may occur.  However, many patients feel a deep cramping in the muscle in which the needle was inserted. This is the local twitch response.    How Will I Feel After The Treatment?   1. Soreness is normal, and the onset of soreness may not occur for a few hours.  Typically this soreness does not last longer than two days.   2. Bruising is uncommon, however; ice can be used to decrease any possible  bruising.   3. In rare cases feeling tired or nauseous after the treatment is normal.  In addition, your symptoms may get worse before they get better, this period will typically not last longer than 24 hours.   What Can I do After My Treatment?   1.  Increase your hydration by drinking more water for the next 24 hours.   2.  You may place ice or heat on the areas treated that have become sore, however don not use heat on inflamed or bruised areas.  Heat often brings more relief post needling.   3. You can continue your regular activities, but vigorous activity is not recommended initially after the treatment for 24 hours.   4. DN is best combined with other physical therapy such as strengthening, stretching, and other therapies.    IF you received an x-ray today, you will receive an invoice from [facility]. Please contact [facility] at [phone] with questions or concerns regarding your invoice.   IF you received labwork today, you will receive an invoice from [place]. Please contact [facility] at [phone] with questions or concerns regarding your invoice.   Our billing staff will not be able to assist you with questions regarding bills from these companies.  You will be contacted  with the lab results as soon as they are available. The fastest way to get your results is to activate your My Chart account. Instructions are located on the last page of this paperwork. If you have not heard from [place] regarding the results in 2 weeks, please contact this office.       IF you received an x-ray today, you will receive an invoice from [facility]. Please contact [facility] at [phone] with questions or concerns regarding your invoice.   IF you received labwork today, you will receive an invoice from [place]. Please contact [facility] at [phone] with questions or concerns regarding your invoice.   Our billing staff will not be able to assist you  with questions regarding bills from these companies.  You will be contacted with the lab results as soon as they are available. The fastest way to get your results is to activate your My Chart account. Instructions are located on the last page of this paperwork. If you have not heard from [place] regarding the results in 2 weeks, please contact this office.

## 2017-12-29 ENCOUNTER — Other Ambulatory Visit: Payer: Self-pay

## 2017-12-29 ENCOUNTER — Encounter: Payer: Self-pay | Admitting: Physician Assistant

## 2017-12-29 ENCOUNTER — Ambulatory Visit (INDEPENDENT_AMBULATORY_CARE_PROVIDER_SITE_OTHER): Payer: PPO | Admitting: Physician Assistant

## 2017-12-29 VITALS — BP 130/80 | HR 83 | Temp 98.2°F | Resp 16 | Ht 66.0 in | Wt 157.4 lb

## 2017-12-29 DIAGNOSIS — M62830 Muscle spasm of back: Secondary | ICD-10-CM

## 2017-12-29 DIAGNOSIS — M545 Low back pain, unspecified: Secondary | ICD-10-CM

## 2017-12-29 NOTE — Progress Notes (Signed)
Evan Lopez  MRN: [id number] DOB: March 15, 1941  PCP: Orma Flaming, MD  Subjective:  Pt is a 77 year old male who presents to clinic for f/u back pain. Pain is of his lower left back. Radiates to left side.  He tried flexeril, however this made him feel groggy and tired.  Endorses reduced ROM with bending and twisting.  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  ROS below.   Review of Systems  Constitutional: Negative for chills, diaphoresis, fatigue and fever.  Gastrointestinal: Negative for abdominal pain, nausea and vomiting.  Genitourinary: Negative for decreased urine volume, difficulty urinating, dysuria, enuresis, flank pain, frequency and urgency.  Musculoskeletal: Positive for back pain. Negative for arthralgias, gait problem, neck pain and neck stiffness.    Patient Active Problem List   Diagnosis Date Noted  . Chest pain 05/13/2012  . Syncope 05/13/2012  . Hyperlipemia 06/24/2011  . 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 GERD (gastroesophageal reflux disease) 06/24/2011  . 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 NONSPECIFIC ABNORMAL FINDING IN STOOL CONTENTS 02/04/2009    Current Outpatient Medications on File Prior to Visit  Medication Sig Dispense Refill  . naproxen sodium (ANAPROX) 550 MG tablet TAKE 1 TABLET BY MOUTH 2 TIMES DAILY WITH A MEAL. 60 tablet 0  . omeprazole (PRILOSEC) 20 MG capsule Take 20 mg by mouth 2 (two) times daily.    . cyclobenzaprine (FLEXERIL) 10 MG tablet Take 1 tablet (10 mg total) by mouth 3 (three) times daily as needed for muscle spasms. (Patient not taking: Reported on 12/29/2017) 30 tablet 0   No current facility-administered medications on file prior to visit.     No Known Allergies   Objective:  BP 130/80 (BP Location: Left Arm, Patient Position: Sitting, Cuff Size: Normal)   Pulse 83   Temp 98.2 F (36.8 C) (Oral)   Resp 16   Ht 5\' 6"  (1.676 m)   Wt 157 lb 6.4 oz (71.4 kg)   SpO2 97%   BMI 25.41 kg/m   Physical  Exam  Constitutional: He is oriented to person, place, and time. He appears well-developed and well-nourished.  Musculoskeletal:       Lumbar back: He exhibits tenderness, pain and spasm. He exhibits normal range of motion, no bony tenderness and no deformity.       Back:  Neurological: He is alert and oriented to person, place, and time.  Skin: Skin is warm and dry.  Psychiatric: He has a normal mood and affect. His behavior is normal. Judgment and thought content normal.  Vitals reviewed.   A trigger point dry needling session was performed at the site of maximal tenderness. This was well tolerated, and followed by moderate relief of pain.  Assessment and Plan :  1. Muscle spasm of back 2. Acute left-sided low back pain without sciatica -Patient presents for follow-up left lower back pain.  Endorses moderate improvement of symptoms following dry needling session.  The area of involvement is much larger than what can be therapeutically treated in one session, plan to refer to physical therapy for follow-up evaluation and treatment.  He understands and agrees with plan.  Return to clinic in 1 week if needed. - Ambulatory referral to Physical Therapy   Mercer Pod, PA-C  [facility] 12/29/2017 9:12 AM  Please note: Portions of this report may have been transcribed using dragon voice recognition software. Every effort was made to ensure accuracy; however,  inadvertent computerized transcription errors may be present.

## 2017-12-29 NOTE — Patient Instructions (Addendum)
Tylenol and/or ibuprofen for pain.  You will receive a phone call to schedule an appointment with physical therapy  Come back and see me on Monday.   Apply moist heat to the area. Wet a towel and wring it out so it is damp. Put it in the microwave for about 15-20 seconds - long enough to make it hot, but not too hot to apply to your skin causing burns. Do this for about 20-30 minutes, 3-4 times a day.   Perform gentle, light stretches 2-3 times a day.   Put a tennis ball between your back and a wall. Gentle massage the area with rolling the ball around the affected area. Try a foam roller.   Stay well hydrated - try to drink 32-64 oz/day.   If you feel like you need a new pillow, try "My Pillow".  Come back and see me if you would like to try a dry needling session. (see below).    Trigger Point Dry Needling   What is Trigger Point Dry Needling (DN)?   1. DN is a physical therapy technique used to treat muscle pain and Dysfunction.  Specifically, DN helps deactivate muscle trigger points (Muscle Knots).   2. A thin filiform needle is used to penetrate the skin and stimulate the underlying trigger point.  The goal is for a local twitch response (LTR) to occur and for the trigger point to relax.  No medication of any kind is injected during the procedure.   What Does Trigger Point Dry Needling Feel Like?   1. The procedures feels different for each individual patient.   Some patients report that they do not actually feel the needle enter the skin and overall the process is not  painful.  Very  mild bleeding may occur.  However, many patients feel a deep cramping in the muscle in which the needle was inserted. This is the local twitch response.    How Will I Feel After The Treatment?   1. Soreness is normal, and the onset of soreness may not occur for a few hours.  Typically this soreness does not last longer than two days.   2. Bruising is uncommon, however; ice can be used to  decrease any possible bruising.   3. In rare cases feeling tired or nauseous after the treatment is normal.  In addition, your symptoms may get worse before they get better, this period will typically not last longer than 24 hours.   What Can I do After My Treatment?   1.  Increase your hydration by drinking more water for the next 24 hours.   2.  You may place ice or heat on the areas treated that have become sore, however don not use heat on inflamed or bruised areas.  Heat often brings more relief post needling.   3. You can continue your regular activities, but vigorous activity is not recommended initially after the treatment for 24 hours.   4. DN is best combined with other physical therapy such as strengthening, stretching, and other therapies.    IF you received an x-ray today, you will receive an invoice from [facility]. Please contact [facility] at [phone] with questions or concerns regarding your invoice.   IF you received labwork today, you will receive an invoice from [facility]. Please contact [facility] at [phone] with questions or concerns regarding your invoice.   Our billing staff will not be able to assist you with questions regarding bills from these companies.  You will be contacted with the lab results as soon as they are available. The fastest way to get your results is to activate your My Chart account. Instructions are located on the last page of this paperwork. If you have not heard from [place] regarding the results in 2 weeks, please contact this office.      IF you received an x-ray today, you will receive an invoice from [facility]. Please contact [facility] at [phone] with questions or concerns regarding your invoice.   IF you received labwork today, you will receive an invoice from [facility]. Please contact [facility] at [phone] with questions or concerns regarding your invoice.   Our billing staff will not be  able to assist you with questions regarding bills from these companies.  You will be contacted with the lab results as soon as they are available. The fastest way to get your results is to activate your My Chart account. Instructions are located on the last page of this paperwork. If you have not heard from [place] regarding the results in 2 weeks, please contact this office.

## 2018-01-04 ENCOUNTER — Encounter: Payer: Self-pay | Admitting: Physician Assistant

## 2018-01-04 ENCOUNTER — Other Ambulatory Visit: Payer: Self-pay

## 2018-01-04 ENCOUNTER — Ambulatory Visit (INDEPENDENT_AMBULATORY_CARE_PROVIDER_SITE_OTHER): Payer: PPO | Admitting: Physician Assistant

## 2018-01-04 VITALS — BP 130/72 | HR 72 | Temp 97.4°F | Resp 16 | Ht 66.0 in | Wt 158.6 lb

## 2018-01-04 DIAGNOSIS — M62838 Other muscle spasm: Secondary | ICD-10-CM | POA: Diagnosis not present

## 2018-01-04 DIAGNOSIS — M545 Low back pain, unspecified: Secondary | ICD-10-CM

## 2018-01-04 NOTE — Patient Instructions (Signed)
     IF you received an x-ray today, you will receive an invoice from [facility]. Please contact  Radiology at [phone] with questions or concerns regarding your invoice.   IF you received labwork today, you will receive an invoice from [facility]. Please contact [facility] at [phone] with questions or concerns regarding your invoice.   Our billing staff will not be able to assist you with questions regarding bills from these companies.  You will be contacted with the lab results as soon as they are available. The fastest way to get your results is to activate your My Chart account. Instructions are located on the last page of this paperwork. If you have not heard from us regarding the results in 2 weeks, please contact this office.     

## 2018-01-04 NOTE — Progress Notes (Signed)
Evan Lopez  MRN: [id number] DOB: 23-Jun-1940  PCP: Dorise Hiss, PA-C  Subjective:  Pt is a 77 year old male who presents to clinic for follow-up back pain. He was here 8/7 with the same chief complaint and received dry needling session. He was quite sore x 3 days following his last dry needling session.  Today he states his pain is about 50% better however not gone and would like another session.  Pain worsens with sitting around. Pain improves with walking. He has been applying moist heat, taking hot showers. He is trying to stay hydrated.  He takes ibuprofen PRN.  He received a phone call to schedule an appointment with physical therapy this morning.  He delayed scheduling as he had an appointment with me today and wanted to wait and see what happened at this appointment.  Review of Systems  Gastrointestinal: Negative for constipation and diarrhea.  Genitourinary: Negative for difficulty urinating and enuresis.  Musculoskeletal: Positive for back pain. Negative for arthralgias, gait problem and joint swelling.  Skin: Negative.   Neurological: Negative for weakness and numbness.    Patient Active Problem List   Diagnosis Date Noted  . Chest pain 05/13/2012  . Syncope 05/13/2012  . Hyperlipemia 06/24/2011  . 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 GERD (gastroesophageal reflux disease) 06/24/2011  . 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 NONSPECIFIC ABNORMAL FINDING IN STOOL CONTENTS 02/04/2009    Current Outpatient Medications on File Prior to Visit  Medication Sig Dispense Refill  . naproxen sodium (ANAPROX) 550 MG tablet TAKE 1 TABLET BY MOUTH 2 TIMES DAILY WITH A MEAL. 60 tablet 0  . omeprazole (PRILOSEC) 20 MG capsule Take 20 mg by mouth 2 (two) times daily.    . cyclobenzaprine (FLEXERIL) 10 MG tablet Take 1 tablet (10 mg total) by mouth 3 (three) times daily as needed for muscle spasms. (Patient not taking: Reported on 12/29/2017) 30 tablet 0   No  current facility-administered medications on file prior to visit.     No Known Allergies   Objective:  BP 130/72   Pulse 72   Temp (!) 97.4 F (36.3 C)   Resp 16   Ht 5\' 6"  (1.676 m)   Wt 158 lb 9.6 oz (71.9 kg)   SpO2 99%   BMI 25.60 kg/m   Physical Exam  Constitutional: He is oriented to person, place, and time. He appears well-developed and well-nourished.  Musculoskeletal:       Legs: Neurological: He is alert and oriented to person, place, and time.  Skin: Skin is warm and dry.  Psychiatric: He has a normal mood and affect. His behavior is normal. Judgment and thought content normal.  Vitals reviewed.  A dry needling session was performed at the site of maximal tenderness of his left gluteus. This was well tolerated, and followed by mild relief of pain.  Assessment and Plan :  1. Left-sided low back pain without sciatica, unspecified chronicity 2. Muscle spasm -Patient presents for follow-up of low left back pain.  He is 50% better following last week's dry needling session.  Another dry needling session performed today inferiorly to last week's session.  Advised moist heat and hydration.  He will call physical therapy and schedule the appointment if symptoms fail to improve.   Mercer Pod, PA-C  [facility] 01/04/2018 10:24 AM  Please note: Portions of this report may have been transcribed using dragon voice recognition software. Every  effort was made to ensure accuracy; however, inadvertent computerized transcription errors may be present.

## 2018-01-05 ENCOUNTER — Ambulatory Visit: Payer: PPO | Admitting: Physician Assistant

## 2018-05-31 DIAGNOSIS — J019 Acute sinusitis, unspecified: Secondary | ICD-10-CM | POA: Diagnosis not present

## 2018-06-03 DIAGNOSIS — J019 Acute sinusitis, unspecified: Secondary | ICD-10-CM | POA: Diagnosis not present

## 2018-06-03 DIAGNOSIS — J069 Acute upper respiratory infection, unspecified: Secondary | ICD-10-CM | POA: Diagnosis not present

## 2018-07-22 DIAGNOSIS — R030 Elevated blood-pressure reading, without diagnosis of hypertension: Secondary | ICD-10-CM | POA: Diagnosis not present

## 2018-07-22 DIAGNOSIS — J209 Acute bronchitis, unspecified: Secondary | ICD-10-CM | POA: Diagnosis not present

## 2018-09-22 ENCOUNTER — Ambulatory Visit (INDEPENDENT_AMBULATORY_CARE_PROVIDER_SITE_OTHER): Payer: PPO | Admitting: Family Medicine

## 2018-09-22 ENCOUNTER — Other Ambulatory Visit: Payer: Self-pay

## 2018-09-22 DIAGNOSIS — Z0000 Encounter for general adult medical examination without abnormal findings: Secondary | ICD-10-CM

## 2018-09-22 NOTE — Patient Instructions (Signed)
Thank you for taking time to come for your Medicare Wellness Visit. I appreciate your ongoing commitment to your health goals. Please review the following plan we discussed and let me know if I can assist you in the future.    LPN  Advance Directive  Advance directives are legal documents that let you make choices ahead of time about your health care and medical treatment in case you become unable to communicate for yourself. Advance directives are a way for you to communicate your wishes to family, friends, and health care providers. This can help convey your decisions about end-of-life care if you become unable to communicate. Discussing and writing advance directives should happen over time rather than all at once. Advance directives can be changed depending on your situation and what you want, even after you have signed the advance directives. If you do not have an advance directive, some states assign family decision makers to act on your behalf based on how closely you are related to them. Each state has its own laws regarding advance directives. You may want to check with your health care provider, attorney, or state representative about the laws in your state. There are different types of advance directives, such as:  Medical power of attorney.  Living will.  Do not resuscitate (DNR) or do not attempt resuscitation (DNAR) order. Health care proxy and medical power of attorney A health care proxy, also called a health care agent, is a person who is appointed to make medical decisions for you in cases in which you are unable to make the decisions yourself. Generally, people choose someone they know well and trust to represent their preferences. Make sure to ask this person for an agreement to act as your proxy. A proxy may have to exercise judgment in the event of a medical decision for which your wishes are not known. A medical power of attorney is a legal document that names your  health care proxy. Depending on the laws in your state, after the document is written, it may also need to be:  Signed.  Notarized.  Dated.  Copied.  Witnessed.  Incorporated into your medical record. You may also want to appoint someone to manage your financial affairs in a situation in which you are unable to do so. This is called a durable power of attorney for finances. It is a separate legal document from the durable power of attorney for health care. You may choose the same person or someone different from your health care proxy to act as your agent in financial matters. If you do not appoint a proxy, or if there is a concern that the proxy is not acting in your best interests, a court-appointed guardian may be designated to act on your behalf. Living will A living will is a set of instructions documenting your wishes about medical care when you cannot express them yourself. Health care providers should keep a copy of your living will in your medical record. You may want to give a copy to family members or friends. To alert caregivers in case of an emergency, you can place a card in your wallet to let them know that you have a living will and where they can find it. A living will is used if you become:  Terminally ill.  Incapacitated.  Unable to communicate or make decisions. Items to consider in your living will include:  The use or non-use of life-sustaining equipment, such as dialysis machines and breathing machines (ventilators).    A DNR or DNAR order, which is the instruction not to use cardiopulmonary resuscitation (CPR) if breathing or heartbeat stops.  The use or non-use of tube feeding.  Withholding of food and fluids.  Comfort (palliative) care when the goal becomes comfort rather than a cure.  Organ and tissue donation. A living will does not give instructions for distributing your money and property if you should pass away. It is recommended that you seek the  advice of a lawyer when writing a will. Decisions about taxes, beneficiaries, and asset distribution will be legally binding. This process can relieve your family and friends of any concerns surrounding disputes or questions that may come up about the distribution of your assets. DNR or DNAR A DNR or DNAR order is a request not to have CPR in the event that your heart stops beating or you stop breathing. If a DNR or DNAR order has not been made and shared, a health care provider will try to help any patient whose heart has stopped or who has stopped breathing. If you plan to have surgery, talk with your health care provider about how your DNR or DNAR order will be followed if problems occur. Summary  Advance directives are the legal documents that allow you to make choices ahead of time about your health care and medical treatment in case you become unable to communicate for yourself.  The process of discussing and writing advance directives should happen over time. You can change the advance directives, even after you have signed them.  Advance directives include DNR or DNAR orders, living wills, and designating an agent as your medical power of attorney. This information is not intended to replace advice given to you by your health care provider. Make sure you discuss any questions you have with your health care provider. Document Released: 08/18/2007 Document Revised: 03/30/2016 Document Reviewed: 03/30/2016 Elsevier Interactive Patient Education  2019 [employer]    Healthy Eating Following a healthy eating pattern may help you to achieve and maintain a healthy body weight, reduce the risk of chronic disease, and live a long and productive life. It is important to follow a healthy eating pattern at an appropriate calorie level for your body. Your nutritional needs should be met primarily through food by choosing a variety of nutrient-rich foods. What are tips for following this plan?  Reading food labels  Read labels and choose the following: ? Reduced or low sodium. ? Juices with 100% fruit juice. ? Foods with low saturated fats and high polyunsaturated and monounsaturated fats. ? Foods with whole grains, such as whole wheat, cracked wheat, brown rice, and wild rice. ? Whole grains that are fortified with folic acid. This is recommended for women who are pregnant or who want to become pregnant.  Read labels and avoid the following: ? Foods with a lot of added sugars. These include foods that contain brown sugar, corn sweetener, corn syrup, dextrose, fructose, glucose, high-fructose corn syrup, honey, invert sugar, lactose, malt syrup, maltose, molasses, raw sugar, sucrose, trehalose, or turbinado sugar.  Do not eat more than the following amounts of added sugar per day:  6 teaspoons (25 g) for women.  9 teaspoons (38 g) for men. ? Foods that contain processed or refined starches and grains. ? Refined grain products, such as white flour, degermed cornmeal, white bread, and white rice. Shopping  Choose nutrient-rich snacks, such as vegetables, whole fruits, and nuts. Avoid high-calorie and high-sugar snacks, such as potato chips, fruit snacks, and   candy.  Use oil-based dressings and spreads on foods instead of solid fats such as butter, stick margarine, or cream cheese.  Limit pre-made sauces, mixes, and "instant" products such as flavored rice, instant noodles, and ready-made pasta.  Try more plant-protein sources, such as tofu, tempeh, black beans, edamame, lentils, nuts, and seeds.  Explore eating plans such as the Mediterranean diet or vegetarian diet. Cooking  Use oil to saut or stir-fry foods instead of solid fats such as butter, stick margarine, or lard.  Try baking, boiling, grilling, or broiling instead of frying.  Remove the fatty part of meats before cooking.  Steam vegetables in water or broth. Meal planning   At meals, imagine dividing your  plate into fourths: ? One-half of your plate is fruits and vegetables. ? One-fourth of your plate is whole grains. ? One-fourth of your plate is protein, especially lean meats, poultry, eggs, tofu, beans, or nuts.  Include low-fat dairy as part of your daily diet. Lifestyle  Choose healthy options in all settings, including home, work, school, restaurants, or stores.  Prepare your food safely: ? Wash your hands after handling raw meats. ? Keep food preparation surfaces clean by regularly washing with hot, soapy water. ? Keep raw meats separate from ready-to-eat foods, such as fruits and vegetables. ? Cook seafood, meat, poultry, and eggs to the recommended internal temperature. ? Store foods at safe temperatures. In general:  Keep cold foods at 40F (4.4C) or below.  Keep hot foods at 140F (60C) or above.  Keep your freezer at 0F (-17.8C) or below.  Foods are no longer safe to eat when they have been between the temperatures of 40-140F (4.4-60C) for more than 2 hours. What foods should I eat? Fruits Aim to eat 2 cup-equivalents of fresh, canned (in natural juice), or frozen fruits each day. Examples of 1 cup-equivalent of fruit include 1 small apple, 8 large strawberries, 1 cup canned fruit,  cup dried fruit, or 1 cup 100% juice. Vegetables Aim to eat 2-3 cup-equivalents of fresh and frozen vegetables each day, including different varieties and colors. Examples of 1 cup-equivalent of vegetables include 2 medium carrots, 2 cups raw, leafy greens, 1 cup chopped vegetable (raw or cooked), or 1 medium baked potato. Grains Aim to eat 6 ounce-equivalents of whole grains each day. Examples of 1 ounce-equivalent of grains include 1 slice of bread, 1 cup ready-to-eat cereal, 3 cups popcorn, or  cup cooked rice, pasta, or cereal. Meats and other proteins Aim to eat 5-6 ounce-equivalents of protein each day. Examples of 1 ounce-equivalent of protein include 1 egg, 1/2 cup nuts or  seeds, or 1 tablespoon (16 g) peanut butter. A cut of meat or fish that is the size of a deck of cards is about 3-4 ounce-equivalents.  Of the protein you eat each week, try to have at least 8 ounces come from seafood. This includes salmon, trout, herring, and anchovies. Dairy Aim to eat 3 cup-equivalents of fat-free or low-fat dairy each day. Examples of 1 cup-equivalent of dairy include 1 cup (240 mL) milk, 8 ounces (250 g) yogurt, 1 ounces (44 g) natural cheese, or 1 cup (240 mL) fortified soy milk. Fats and oils  Aim for about 5 teaspoons (21 g) per day. Choose monounsaturated fats, such as canola and olive oils, avocados, peanut butter, and most nuts, or polyunsaturated fats, such as sunflower, corn, and soybean oils, walnuts, pine nuts, sesame seeds, sunflower seeds, and flaxseed. Beverages  Aim for six 8-oz glasses of   water per day. Limit coffee to three to five 8-oz cups per day.  Limit caffeinated beverages that have added calories, such as soda and energy drinks.  Limit alcohol intake to no more than 1 drink a day for nonpregnant women and 2 drinks a day for men. One drink equals 12 oz of beer (355 mL), 5 oz of wine (148 mL), or 1 oz of hard liquor (44 mL). Seasoning and other foods  Avoid adding excess amounts of salt to your foods. Try flavoring foods with herbs and spices instead of salt.  Avoid adding sugar to foods.  Try using oil-based dressings, sauces, and spreads instead of solid fats. This information is based on general [place] nutrition guidelines. For more information, visit choosemyplate.gov. Exact amounts may vary based on your nutrition needs. Summary  A healthy eating plan may help you to maintain a healthy weight, reduce the risk of chronic diseases, and stay active throughout your life.  Plan your meals. Make sure you eat the right portions of a variety of nutrient-rich foods.  Try baking, boiling, grilling, or broiling instead of frying.  Choose healthy  options in all settings, including home, work, school, restaurants, or stores. This information is not intended to replace advice given to you by your health care provider. Make sure you discuss any questions you have with your health care provider. Document Released: 08/23/2017 Document Revised: 08/23/2017 Document Reviewed: 08/23/2017 Elsevier Interactive Patient Education  2019 [employer].  

## 2018-09-22 NOTE — Progress Notes (Signed)
Presents today for [employer] Visit   Date of last exam:  01-04-2018  Interpreter used for this visit? No  telemed visit -  Patient Care Team: McVey, Gelene Mink, PA-C as PCP - General (Physician Assistant)   Other items to address today:   Discussed pneumovax 23 postponed until 12-2018  Discussed eye/dental yearly exams Tetanus declined due to insurance  Other Screening:  Last lipid screening: 01/07/2017  ADVANCE DIRECTIVES: Discussed:yes On File:no Materials Provided: yes  Immunization status:  Immunization History  Administered Date(s) Administered  . Influenza Split 06/24/2011, 03/24/2012  . Influenza,inj,Quad PF,6+ Mos 01/28/2016  . Influenza-Unspecified 02/22/2017  . Pneumococcal Conjugate-13 01/07/2017     Health Maintenance Due  Topic Date Due  . Samul Dada  06/24/2015     Functional Status Survey: Is the patient deaf or have difficulty hearing?: No Does the patient have difficulty seeing, even when wearing glasses/contacts?: No Does the patient have difficulty concentrating, remembering, or making decisions?: No Does the patient have difficulty walking or climbing stairs?: No Does the patient have difficulty dressing or bathing?: No Does the patient have difficulty doing errands alone such as visiting a doctor's office or shopping?: No   6CIT Screen 09/22/2018  What Year? 0 points  What month? 0 points  What time? 0 points  Count back from 20 0 points  Months in reverse 0 points  Repeat phrase 0 points  Total Score 0        Clinical Support from 09/22/2018 in [facility]  AUDIT-C Score  0       Home Environment:   Live one story home with wife   No trouble climbing stairs  No scattered rugs No grab bars  Well lit    Patient Active Problem List   Diagnosis Date Noted  . Chest pain 05/13/2012  . Syncope 05/13/2012  . Hyperlipemia 06/24/2011  . 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 GERD (gastroesophageal reflux disease) 06/24/2011  . 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 NONSPECIFIC ABNORMAL FINDING IN STOOL CONTENTS 02/04/2009     Past Medical History:  Diagnosis Date  . DIVERTICULOSIS OF COLON   . GERD (gastroesophageal reflux disease)   . Hyperlipidemia   . Kidney calculi   . Nonspecific abnormal finding in stool contents      Past Surgical History:  Procedure Laterality Date  . APPENDECTOMY       Family History  Problem Relation Age of Onset  . Cancer Mother   . Alcohol abuse Father      Social History   Socioeconomic History  . Marital status: Married    Spouse name: Not on file  . Number of children: Not on file  . Years of education: Not on file  . Highest education level: Not on file  Occupational History  . Not on file  Social Needs  . Financial resource strain: Not on file  . Food insecurity:    Worry: Not on file    Inability: Not on file  . Transportation needs:    Medical: Not on file    Non-medical: Not on file  Tobacco Use  . Smoking status: Never Smoker  . Smokeless tobacco: Never Used  Substance and Sexual Activity  . Alcohol use: No    Alcohol/week: 0.0 standard drinks  . Drug use: No  . Sexual activity: Yes  Lifestyle  . Physical activity:    Days per week: Not on file  Minutes per session: Not on file  . Stress: Not on file  Relationships  . Social connections:    Talks on phone: Not on file    Gets together: Not on file    Attends religious service: Not on file    Active member of club or organization: Not on file    Attends meetings of clubs or organizations: Not on file    Relationship status: Not on file  . Intimate partner violence:    Fear of current or ex partner: Not on file    Emotionally abused: Not on file    Physically abused: Not on file    Forced sexual activity: Not on file  Other Topics Concern  . Not on file  Social History Narrative  . Not on file     No Known Allergies    Prior to Admission medications   Medication Sig Start Date End Date Taking? Authorizing Provider  omeprazole (PRILOSEC) 20 MG capsule Take 20 mg by mouth 2 (two) times daily.   Yes [provider]  cyclobenzaprine (FLEXERIL) 10 MG tablet Take 1 tablet (10 mg total) by mouth 3 (three) times daily as needed for muscle spasms. Patient not taking: Reported on 12/29/2017 12/21/17   McVey, Gelene Mink, PA-C  naproxen sodium (ANAPROX) 550 MG tablet TAKE 1 TABLET BY MOUTH 2 TIMES DAILY WITH A MEAL. Patient not taking: Reported on 09/22/2018 06/04/17   Dorise Hiss, [place]     Depression screen [facility] 2/9 09/22/2018 01/04/2018 12/29/2017 12/21/2017 03/10/2017  Decreased Interest 0 0 0 0 0  Down, Depressed, Hopeless 0 0 0 0 0  PHQ - 2 Score 0 0 0 0 0     Fall Risk  09/22/2018 01/04/2018 12/29/2017 12/21/2017 03/10/2017  Falls in the past year? 0 No No No No  Number falls in past yr: 0 - - - -  Injury with Fall? 0 - - - -      PHYSICAL EXAM: There were no vitals taken for this visit.   Wt Readings from Last 3 Encounters:  01/04/18 158 lb 9.6 oz (71.9 kg)  12/29/17 157 lb 6.4 oz (71.4 kg)  12/21/17 157 lb 12.8 oz (71.6 kg)     No exam data present    Physical Exam   Education/Counseling provided regarding diet and exercise, prevention of chronic diseases, smoking/tobacco cessation, if applicable, and reviewed "Covered Medicare Preventive Services."   ASSESSMENT/PLAN: There are no diagnoses linked to this encounter.

## 2019-01-11 ENCOUNTER — Encounter: Payer: Self-pay | Admitting: Gastroenterology

## 2019-12-25 DIAGNOSIS — L570 Actinic keratosis: Secondary | ICD-10-CM | POA: Diagnosis not present

## 2019-12-25 DIAGNOSIS — L819 Disorder of pigmentation, unspecified: Secondary | ICD-10-CM | POA: Diagnosis not present

## 2020-05-30 ENCOUNTER — Ambulatory Visit (INDEPENDENT_AMBULATORY_CARE_PROVIDER_SITE_OTHER): Payer: Medicare Other

## 2020-05-30 ENCOUNTER — Other Ambulatory Visit: Payer: Self-pay

## 2020-05-30 ENCOUNTER — Ambulatory Visit (INDEPENDENT_AMBULATORY_CARE_PROVIDER_SITE_OTHER): Payer: Medicare Other | Admitting: Family Medicine

## 2020-05-30 ENCOUNTER — Encounter: Payer: Self-pay | Admitting: Family Medicine

## 2020-05-30 VITALS — BP 136/77 | HR 74 | Temp 98.2°F | Resp 15 | Ht 66.0 in | Wt 156.0 lb

## 2020-05-30 DIAGNOSIS — S299XXA Unspecified injury of thorax, initial encounter: Secondary | ICD-10-CM

## 2020-05-30 DIAGNOSIS — M19012 Primary osteoarthritis, left shoulder: Secondary | ICD-10-CM | POA: Diagnosis not present

## 2020-05-30 DIAGNOSIS — S4992XA Unspecified injury of left shoulder and upper arm, initial encounter: Secondary | ICD-10-CM

## 2020-05-30 DIAGNOSIS — S2232XA Fracture of one rib, left side, initial encounter for closed fracture: Secondary | ICD-10-CM

## 2020-05-30 DIAGNOSIS — R0781 Pleurodynia: Secondary | ICD-10-CM | POA: Diagnosis not present

## 2020-05-30 DIAGNOSIS — B029 Zoster without complications: Secondary | ICD-10-CM

## 2020-05-30 MED ORDER — VALACYCLOVIR HCL 1 G PO TABS: 1000.0000 mg | ORAL_TABLET | Freq: Three times a day (TID) | ORAL | 0 refills | Status: DC

## 2020-05-30 NOTE — Patient Instructions (Addendum)
You have shingles on about the T8 distribution along with the old fracture of the left seventh rib.  It is just starting to get red bumps but may very well develop blistering.  Take valacyclovir (Valtrex) 1 g 3 times daily for 1 week as prescribed, which is an antiviral medication that we will shorten the length and severity of the shingles.  Unfortunately, some people develop postherpetic neuralgia after having had the shingles and can have pain in that area for a long time.  If you persist with having bad pain there are some other medications that can be given you to try and suppress the nerve pain.  You can apply Zostrix which you can buy over-the-counter at the pharmacy that we will give some topical relief.  It is made from a red pepper extract.  Take Tylenol 500 mg 2 pills maximum of 3 times daily as needed for pain.  Additionally you can take ibuprofen 200 mg 2 or 3 pills 3 times daily or Aleve twice daily.  However both ibuprofen and Aleve can cause your heartburn to be worse, so I would try the Tylenol first.  You can take the ibuprofen and Aleve in addition to the Tylenol if needed.  Try to minimize the ibuprofen and Aleve use.  In 6 months or a year you might consider getting the shingles vaccination still since you can get it a second time.  See the below article on shingles.  Return if worse   Shingles  Shingles, which is also known as herpes zoster, is an infection that causes a painful skin rash and fluid-filled blisters. It is caused by a virus. Shingles only develops in people who:  Have had chickenpox.  Have been given a medicine to protect against chickenpox (have been vaccinated). Shingles is rare in this group. What are the causes? Shingles is caused by varicella-zoster virus (VZV). This is the same virus that causes chickenpox. After a person is exposed to VZV, the virus stays in the body in an inactive (dormant) state. Shingles develops if the virus is  reactivated. This can happen many years after the first (initial) exposure to VZV. It is not known what causes this virus to be reactivated. What increases the risk? People who have had chickenpox or received the chickenpox vaccine are at risk for shingles. Shingles infection is more common in people who:  Are older than age 74.  Have a weakened disease-fighting system (immune system), such as people with: ? HIV. ? AIDS. ? Cancer.  Are taking medicines that weaken the immune system, such as transplant medicines.  Are experiencing a lot of stress. What are the signs or symptoms? Early symptoms of this condition include itching, tingling, and pain in an area on your skin. Pain may be described as burning, stabbing, or throbbing. A few days or weeks after early symptoms start, a painful red rash appears. The rash is usually on one side of the body and has a band-like or belt-like pattern. The rash eventually turns into fluid-filled blisters that break open, change into scabs, and dry up in about 2-3 weeks. At any time during the infection, you may also develop:  A fever.  Chills.  A headache.  An upset stomach. How is this diagnosed? This condition is diagnosed with a skin exam. Skin or fluid samples may be taken from the blisters before a diagnosis is made. These samples are examined under a microscope or sent to a lab for testing. How is this treated?  The rash may last for several weeks. There is not a specific cure for this condition. Your health care provider will probably prescribe medicines to help you manage pain, recover more quickly, and avoid long-term problems. Medicines may include:  Antiviral drugs.  Anti-inflammatory drugs.  Pain medicines.  Anti-itching medicines (antihistamines). If the area involved is on your face, you may be referred to a specialist, such as an eye doctor (ophthalmologist) or an ear, nose, and throat (ENT) doctor (otolaryngologist) to help you  avoid eye problems, chronic pain, or disability. Follow these instructions at home: Medicines  Take over-the-counter and prescription medicines only as told by your health care provider.  Apply an anti-itch cream or numbing cream to the affected area as told by your health care provider. Relieving itching and discomfort   Apply cold, wet cloths (cold compresses) to the area of the rash or blisters as told by your health care provider.  Cool baths can be soothing. Try adding baking soda or dry oatmeal to the water to reduce itching. Do not bathe in hot water. Blister and rash care  Keep your rash covered with a loose bandage (dressing). Wear loose-fitting clothing to help ease the pain of material rubbing against the rash.  Keep your rash and blisters clean by washing the area with mild soap and cool water as told by your health care provider.  Check your rash every day for signs of infection. Check for: ? More redness, swelling, or pain. ? Fluid or blood. ? Warmth. ? Pus or a bad smell.  Do not scratch your rash or pick at your blisters. To help avoid scratching: ? Keep your fingernails clean and cut short. ? Wear gloves or mittens while you sleep, if scratching is a problem. General instructions  Rest as told by your health care provider.  Keep all follow-up visits as told by your health care provider. This is important.  Wash your hands often with soap and water. If soap and water are not available, use hand sanitizer. Doing this lowers your chance of getting a bacterial skin infection.  Before your blisters change into scabs, your shingles infection can cause chickenpox in people who have never had it or have never been vaccinated against it. To prevent this from happening, avoid contact with other people, especially: ? Babies. ? Pregnant women. ? Children who have eczema. ? Elderly people who have transplants. ? People who have chronic illnesses, such as cancer or  AIDS. Contact a health care provider if:  Your pain is not relieved with prescribed medicines.  Your pain does not get better after the rash heals.  You have signs of infection in the rash area, such as: ? More redness, swelling, or pain around the rash. ? Fluid or blood coming from the rash. ? The rash area feeling warm to the touch. ? Pus or a bad smell coming from the rash. Get help right away if:  The rash is on your face or nose.  You have facial pain, pain around your eye area, or loss of feeling on one side of your face.  You have difficulty seeing.  You have ear pain or have ringing in your ear.  You have a loss of taste.  Your condition gets worse. Summary  Shingles, which is also known as herpes zoster, is an infection that causes a painful skin rash and fluid-filled blisters.  This condition is diagnosed with a skin exam. Skin or fluid samples may be taken from the  blisters and examined before the diagnosis is made.  Keep your rash covered with a loose bandage (dressing). Wear loose-fitting clothing to help ease the pain of material rubbing against the rash.  Before your blisters change into scabs, your shingles infection can cause chickenpox in people who have never had it or have never been vaccinated against it. This information is not intended to replace advice given to you by your health care provider. Make sure you discuss any questions you have with your health care provider. Document Revised: 09/02/2018 Document Reviewed: 01/13/2017 Elsevier Patient Education  [employer].    If you have lab work done today you will be contacted with your lab results within the next 2 weeks.  If you have not heard from [place] then please contact us. The fastest way to get your results is to register for My Chart.   IF you received an x-ray today, you will receive an invoice from [facility]. Please contact [facility] at [phone] with questions or  concerns regarding your invoice.   IF you received labwork today, you will receive an invoice from [facility]. Please contact [facility] at [phone] with questions or concerns regarding your invoice.   Our billing staff will not be able to assist you with questions regarding bills from these companies.  You will be contacted with the lab results as soon as they are available. The fastest way to get your results is to activate your My Chart account. Instructions are located on the last page of this paperwork. If you have not heard from [place] regarding the results in 2 weeks, please contact this office.
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
## 2020-06-26 DIAGNOSIS — L819 Disorder of pigmentation, unspecified: Secondary | ICD-10-CM | POA: Diagnosis not present

## 2020-08-19 ENCOUNTER — Encounter: Payer: Self-pay | Admitting: Family Medicine

## 2020-09-18 ENCOUNTER — Encounter: Payer: Medicare Other | Admitting: Family Medicine

## 2020-09-18 DIAGNOSIS — Z0289 Encounter for other administrative examinations: Secondary | ICD-10-CM

## 2020-10-07 ENCOUNTER — Ambulatory Visit (INDEPENDENT_AMBULATORY_CARE_PROVIDER_SITE_OTHER): Payer: Medicare Other | Admitting: Family Medicine

## 2020-10-07 ENCOUNTER — Encounter: Payer: Self-pay | Admitting: Family Medicine

## 2020-10-07 ENCOUNTER — Other Ambulatory Visit: Payer: Self-pay

## 2020-10-07 VITALS — BP 130/66 | HR 66 | Temp 98.1°F | Resp 16 | Ht 66.0 in | Wt 158.8 lb

## 2020-10-07 DIAGNOSIS — E785 Hyperlipidemia, unspecified: Secondary | ICD-10-CM | POA: Diagnosis not present

## 2020-10-07 DIAGNOSIS — Z23 Encounter for immunization: Secondary | ICD-10-CM

## 2020-10-07 DIAGNOSIS — G8929 Other chronic pain: Secondary | ICD-10-CM | POA: Diagnosis not present

## 2020-10-07 DIAGNOSIS — M25512 Pain in left shoulder: Secondary | ICD-10-CM | POA: Diagnosis not present

## 2020-10-07 DIAGNOSIS — D649 Anemia, unspecified: Secondary | ICD-10-CM

## 2020-10-07 NOTE — Progress Notes (Signed)
Subjective:  Patient ID: Evan Lopez, male    DOB: 10/11/1940  Age: 80 y.o. MRN: [id number]  CC:  Chief Complaint  Patient presents with  . Establish Care    Pt transfer from [facility], doing well, no concerns     HPI Evan Lopez presents for   [facility].  Previous PCP Juanda Crumble.   He was evaluated in January by Dr. Linna Darner with shoulder pain, herpes zoster, rib pain.  Possible fracture left anterior seventh rib at that time on x-ray.  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 Still some left shoulder pain. R hand dominant.  No wakening d/t pain.  Min pain in shoulder during day - notes with certain mvmts flexion, abductions. Limits strenuous activities.  Tx:  advil or tylenol- rare need.   Anemia Borderline low hemoglobin of 13.5 in October 2018.  History of diverticulosis, noted in October 2014, GI Dr. Amedeo Plenty.  Few diverticula in the sigmoid, transverse, descending colon.  Colonoscopy otherwise normal.  No melena/hematochezia. No fatigue, dizziness.   Hyperlipidemia: No current statin/meds. Last ate 6 hrs ago. No prior statin.  Lab Results  Component Value Date   CHOL 208 (H) 01/07/2017   HDL 56 01/07/2017   LDLCALC 137 (H) 01/07/2017   TRIG 74 01/07/2017   CHOLHDL 3.7 01/07/2017   Lab Results  Component Value Date   ALT 12 01/07/2017   AST 18 01/07/2017   ALKPHOS 109 01/07/2017   BILITOT 0.7 01/07/2017   Heartburn/GERD Has taken omeprazole daily. Preventative. spicy foods can worsen sx's. No recent skipped days. No hx PUD.   Health maintenance: Due for COVID-vaccine - had both shots and booster. Recommended updated booster.  pneumonia vaccine.  Immunization History  Administered Date(s) Administered  . Influenza Split 06/24/2011, 03/24/2012  . Influenza,inj,Quad PF,6+ Mos 01/28/2016  . Influenza-Unspecified 02/22/2017, 02/22/2020  .  Pneumococcal Conjugate-13 01/07/2017     History Patient Active Problem List   Diagnosis Date Noted  . Chest pain 05/13/2012  . Syncope 05/13/2012  . Hyperlipemia 06/24/2011  . 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 GERD (gastroesophageal reflux disease) 06/24/2011  . 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 NONSPECIFIC ABNORMAL FINDING IN STOOL CONTENTS 02/04/2009   Past Medical History:  Diagnosis Date  . DIVERTICULOSIS OF COLON   . GERD (gastroesophageal reflux disease)   . Hyperlipidemia   . Kidney calculi   . Nonspecific abnormal finding in stool contents    Past Surgical History:  Procedure Laterality Date  . APPENDECTOMY     No Known Allergies Prior to Admission medications   Medication Sig Start Date End Date Taking? Authorizing Provider  omeprazole (PRILOSEC) 20 MG capsule Take 20 mg by mouth 2 (two) times daily.   Yes [provider]   Social History   Socioeconomic History  . Marital status: Married    Spouse name: Not on file  . Number of children: Not on file  . Years of education: Not on file  . Highest education level: Not on file  Occupational History  . Not on file  Tobacco Use  . Smoking status: Never Smoker  . Smokeless tobacco: Never Used  Substance and Sexual Activity  . Alcohol use: No    Alcohol/week: 0.0 standard drinks  . Drug use: No  . Sexual activity: Yes  Other Topics Concern  . Not on file  Social History Narrative  .  Not on file   Social Determinants of Health   Financial Resource Strain: Not on file  Food Insecurity: Not on file  Transportation Needs: Not on file  Physical Activity: Not on file  Stress: Not on file  Social Connections: Not on file  Intimate Partner Violence: Not on file    Review of Systems Per HPI  Objective:   Vitals:   10/07/20 1403  BP: 130/66  Pulse: 66  Resp: 16  Temp: 98.1 F (36.7 C)  TempSrc: Temporal  SpO2: 99%  Weight: 158 lb 12.8 oz (72 kg)  Height: 5\' 6"  (1.676  m)     Physical Exam Vitals reviewed.  Constitutional:      Appearance: He is well-developed.  HENT:     Head: Normocephalic and atraumatic.  Eyes:     Pupils: Pupils are equal, round, and reactive to light.  Neck:     Vascular: No carotid bruit or JVD.  Cardiovascular:     Rate and Rhythm: Normal rate and regular rhythm.     Heart sounds: Normal heart sounds. No murmur heard.   Pulmonary:     Effort: Pulmonary effort is normal.     Breath sounds: Normal breath sounds. No rales.  Musculoskeletal:     Comments: C-spine nontender, pain-free range of motion. Left shoulder: [place], AC, clavicle nontender.  Minimal tenderness over the bicipital groove.  Full rotator cuff strength, full range of motion, negative liftoff, negative empty can.  Minimal pain with Neer, Luan Pulling.  Skin:    General: Skin is warm and dry.  Neurological:     Mental Status: He is alert and oriented to person, place, and time.    36 minutes spent during visit, greater than 50% counseling and assimilation of information, chart review, and discussion of plan.    Assessment & Plan:  Evan Lopez is a 80 y.o. male . Chronic left shoulder pain  -Suspect component of bursitis plus or minus osteoarthritis of shoulder.  Option of subacromial injection discussed, will continue over-the-counter treatment for now.  Previous imaging reviewed as above.  Hyperlipidemia, unspecified hyperlipidemia type - Plan: Lipid panel, Comprehensive metabolic panel  -No current meds, check updated labs.  Anemia, unspecified type - Plan: CBC Asymptomatic, repeat CBC.  If still low, may need updated colonoscopy.  Need for pneumococcal vaccination - Plan: Pneumococcal polysaccharide vaccine 23-valent greater than or equal to 2yo subcutaneous/IM   No orders of the defined types were placed in this encounter.  Patient Instructions   You can try to skip a few days per week of the omeprazole to see if still controls heartburn - see  below for foods to avoid.   I will check some labs today.   You may have some arthritis and bursitis in your left shoulder. Occasional tylenol is ok if needed, rare advil or alleve. I can inject that shoulder if persistent soreness.  Return to the clinic or go to the nearest emergency room if any of your symptoms worsen or new symptoms occur.  Thanks for coming in today.   Shoulder Pain Many things can cause shoulder pain, including:  An injury to the shoulder.  Overuse of the shoulder.  Arthritis. The source of the pain can be:  Inflammation.  An injury to the shoulder joint.  An injury to a tendon, ligament, or bone. Follow these instructions at home: Pay attention to changes in your symptoms. Let your health care provider know about them. Follow these instructions to relieve your pain.  If you have a sling:  Wear the sling as told by your health care provider. Remove it only as told by your health care provider.  Loosen the sling if your fingers tingle, become numb, or turn cold and blue.  Keep the sling clean.  If the sling is not waterproof: ? Do not let it get wet. Remove it to shower or bathe.  Move your arm as little as possible, but keep your hand moving to prevent swelling. Managing pain, stiffness, and swelling  If directed, put ice on the painful area: ? Put ice in a plastic bag. ? Place a towel between your skin and the bag. ? Leave the ice on for 20 minutes, 2-3 times per day. Stop applying ice if it does not help with the pain.  Squeeze a soft ball or a foam pad as much as possible. This helps to keep the shoulder from swelling. It also helps to strengthen the arm.   General instructions  Take over-the-counter and prescription medicines only as told by your health care provider.  Keep all follow-up visits as told by your health care provider. This is important. Contact a health care provider if:  Your pain gets worse.  Your pain is not relieved with  medicines.  New pain develops in your arm, hand, or fingers. Get help right away if:  Your arm, hand, or fingers: ? Tingle. ? Become numb. ? Become swollen. ? Become painful. ? Turn white or blue. Summary  Shoulder pain can be caused by an injury, overuse, or arthritis.  Pay attention to changes in your symptoms. Let your health care provider know about them.  This condition may be treated with a sling, ice, and pain medicines.  Contact your health care provider if the pain gets worse or new pain develops. Get help right away if your arm, hand, or fingers tingle or become numb, swollen, or painful.  Keep all follow-up visits as told by your health care provider. This is important. This information is not intended to replace advice given to you by your health care provider. Make sure you discuss any questions you have with your health care provider. Document Revised: 11/23/2017 Document Reviewed: 11/23/2017 Elsevier Patient Education  2021 [place] for Gastroesophageal Reflux Disease, Adult When you have gastroesophageal reflux disease (GERD), the foods you eat and your eating habits are very important. Choosing the right foods can help ease the discomfort of GERD. Consider working with a dietitian to help you make healthy food choices. What are tips for following this plan? Reading food labels  Look for foods that are low in saturated fat. Foods that have less than 5% of daily value (DV) of fat and 0 g of trans fats may help with your symptoms. Cooking  Cook foods using methods other than frying. This may include baking, steaming, grilling, or broiling. These are all methods that do not need a lot of fat for cooking.  To add flavor, try to use herbs that are low in spice and acidity. Meal planning  Choose healthy foods that are low in fat, such as fruits, vegetables, whole grains, low-fat dairy products, lean meats, fish, and poultry.  Eat frequent,  small meals instead of three large meals each day. Eat your meals slowly, in a relaxed setting. Avoid bending over or lying down until 2-3 hours after eating.  Limit high-fat foods such as fatty meats or fried foods.  Limit your intake of fatty foods, such  as oils, butter, and shortening.  Avoid the following as told by your health care provider: ? Foods that cause symptoms. These may be different for different people. Keep a food diary to keep track of foods that cause symptoms. ? Alcohol. ? Drinking large amounts of liquid with meals. ? Eating meals during the 2-3 hours before bed.   Lifestyle  Maintain a healthy weight. Ask your health care provider what weight is healthy for you. If you need to lose weight, work with your health care provider to do so safely.  Exercise for at least 30 minutes on 5 or more days each week, or as told by your health care provider.  Avoid wearing clothes that fit tightly around your waist and chest.  Do not use any products that contain nicotine or tobacco. These products include cigarettes, chewing tobacco, and vaping devices, such as e-cigarettes. If you need help quitting, ask your health care provider.  Sleep with the head of your bed raised. Use a wedge under the mattress or blocks under the bed frame to raise the head of the bed.  Chew sugar-free gum after mealtimes. What foods should I eat? Eat a healthy, well-balanced diet of fruits, vegetables, whole grains, low-fat dairy products, lean meats, fish, and poultry. Each person is different. Foods that may trigger symptoms in one person may not trigger any symptoms in another person. Work with your health care provider to identify foods that are safe for you. The items listed above may not be a complete list of recommended foods and beverages. Contact a dietitian for more information.   What foods should I avoid? Limiting some of these foods may help manage the symptoms of GERD. Everyone is  different. Consult a dietitian or your health care provider to help you identify the exact foods to avoid, if any. Fruits Any fruits prepared with added fat. Any fruits that cause symptoms. For some people this may include citrus fruits, such as oranges, grapefruit, pineapple, and lemons. Vegetables Deep-fried vegetables. [place] fries. Any vegetables prepared with added fat. Any vegetables that cause symptoms. For some people, this may include tomatoes and tomato products, chili peppers, onions and garlic, and horseradish. Grains Pastries or quick breads with added fat. Meats and other proteins High-fat meats, such as fatty beef or pork, hot dogs, ribs, ham, sausage, salami, and bacon. Fried meat or protein, including fried fish and fried chicken. Nuts and nut butters, in large amounts. Dairy Whole milk and chocolate milk. Sour cream. Cream. Ice cream. Cream cheese. Milkshakes. Fats and oils Butter. Margarine. Shortening. Ghee. Beverages Coffee and tea, with or without caffeine. Carbonated beverages. Sodas. Energy drinks. Fruit juice made with acidic fruits, such as orange or grapefruit. Tomato juice. Alcoholic drinks. Sweets and desserts Chocolate and cocoa. Donuts. Seasonings and condiments Pepper. Peppermint and spearmint. Added salt. Any condiments, herbs, or seasonings that cause symptoms. For some people, this may include curry, hot sauce, or vinegar-based salad dressings. The items listed above may not be a complete list of foods and beverages to avoid. Contact a dietitian for more information. Questions to ask your health care provider Diet and lifestyle changes are usually the first steps that are taken to manage symptoms of GERD. If diet and lifestyle changes do not improve your symptoms, talk with your health care provider about taking medicines. Where to find more information  International Foundation for Gastrointestinal Disorders: aboutgerd.org Summary  When you have  gastroesophageal reflux disease (GERD), food and lifestyle choices may be very  helpful in easing the discomfort of GERD.  Eat frequent, small meals instead of three large meals each day. Eat your meals slowly, in a relaxed setting. Avoid bending over or lying down until 2-3 hours after eating.  Limit high-fat foods such as fatty meats or fried foods. This information is not intended to replace advice given to you by your health care provider. Make sure you discuss any questions you have with your health care provider. Document Revised: 11/20/2019 Document Reviewed: 11/20/2019 Elsevier Patient Education  2021 [place].      Signed, Merri Ray, MD [facility]

## 2020-10-07 NOTE — Patient Instructions (Addendum)
You can try to skip a few days per week of the omeprazole to see if still controls heartburn - see below for foods to avoid.   I will check some labs today.   You may have some arthritis and bursitis in your left shoulder. Occasional tylenol is ok if needed, rare advil or alleve. I can inject that shoulder if persistent soreness.  Return to the clinic or go to the nearest emergency room if any of your symptoms worsen or new symptoms occur.  Thanks for coming in today.   Shoulder Pain Many things can cause shoulder pain, including:  An injury to the shoulder.  Overuse of the shoulder.  Arthritis. The source of the pain can be:  Inflammation.  An injury to the shoulder joint.  An injury to a tendon, ligament, or bone. Follow these instructions at home: Pay attention to changes in your symptoms. Let your health care provider know about them. Follow these instructions to relieve your pain. If you have a sling:  Wear the sling as told by your health care provider. Remove it only as told by your health care provider.  Loosen the sling if your fingers tingle, become numb, or turn cold and blue.  Keep the sling clean.  If the sling is not waterproof: ? Do not let it get wet. Remove it to shower or bathe.  Move your arm as little as possible, but keep your hand moving to prevent swelling. Managing pain, stiffness, and swelling  If directed, put ice on the painful area: ? Put ice in a plastic bag. ? Place a towel between your skin and the bag. ? Leave the ice on for 20 minutes, 2-3 times per day. Stop applying ice if it does not help with the pain.  Squeeze a soft ball or a foam pad as much as possible. This helps to keep the shoulder from swelling. It also helps to strengthen the arm.   General instructions  Take over-the-counter and prescription medicines only as told by your health care provider.  Keep all follow-up visits as told by your health care provider. This is  important. Contact a health care provider if:  Your pain gets worse.  Your pain is not relieved with medicines.  New pain develops in your arm, hand, or fingers. Get help right away if:  Your arm, hand, or fingers: ? Tingle. ? Become numb. ? Become swollen. ? Become painful. ? Turn white or blue. Summary  Shoulder pain can be caused by an injury, overuse, or arthritis.  Pay attention to changes in your symptoms. Let your health care provider know about them.  This condition may be treated with a sling, ice, and pain medicines.  Contact your health care provider if the pain gets worse or new pain develops. Get help right away if your arm, hand, or fingers tingle or become numb, swollen, or painful.  Keep all follow-up visits as told by your health care provider. This is important. This information is not intended to replace advice given to you by your health care provider. Make sure you discuss any questions you have with your health care provider. Document Revised: 11/23/2017 Document Reviewed: 11/23/2017 Elsevier Patient Education  2021 [place] for Gastroesophageal Reflux Disease, Adult When you have gastroesophageal reflux disease (GERD), the foods you eat and your eating habits are very important. Choosing the right foods can help ease the discomfort of GERD. Consider working with a dietitian to help you  make healthy food choices. What are tips for following this plan? Reading food labels  Look for foods that are low in saturated fat. Foods that have less than 5% of daily value (DV) of fat and 0 g of trans fats may help with your symptoms. Cooking  Cook foods using methods other than frying. This may include baking, steaming, grilling, or broiling. These are all methods that do not need a lot of fat for cooking.  To add flavor, try to use herbs that are low in spice and acidity. Meal planning  Choose healthy foods that are low in fat, such as  fruits, vegetables, whole grains, low-fat dairy products, lean meats, fish, and poultry.  Eat frequent, small meals instead of three large meals each day. Eat your meals slowly, in a relaxed setting. Avoid bending over or lying down until 2-3 hours after eating.  Limit high-fat foods such as fatty meats or fried foods.  Limit your intake of fatty foods, such as oils, butter, and shortening.  Avoid the following as told by your health care provider: ? Foods that cause symptoms. These may be different for different people. Keep a food diary to keep track of foods that cause symptoms. ? Alcohol. ? Drinking large amounts of liquid with meals. ? Eating meals during the 2-3 hours before bed.   Lifestyle  Maintain a healthy weight. Ask your health care provider what weight is healthy for you. If you need to lose weight, work with your health care provider to do so safely.  Exercise for at least 30 minutes on 5 or more days each week, or as told by your health care provider.  Avoid wearing clothes that fit tightly around your waist and chest.  Do not use any products that contain nicotine or tobacco. These products include cigarettes, chewing tobacco, and vaping devices, such as e-cigarettes. If you need help quitting, ask your health care provider.  Sleep with the head of your bed raised. Use a wedge under the mattress or blocks under the bed frame to raise the head of the bed.  Chew sugar-free gum after mealtimes. What foods should I eat? Eat a healthy, well-balanced diet of fruits, vegetables, whole grains, low-fat dairy products, lean meats, fish, and poultry. Each person is different. Foods that may trigger symptoms in one person may not trigger any symptoms in another person. Work with your health care provider to identify foods that are safe for you. The items listed above may not be a complete list of recommended foods and beverages. Contact a dietitian for more information.   What foods  should I avoid? Limiting some of these foods may help manage the symptoms of GERD. Everyone is different. Consult a dietitian or your health care provider to help you identify the exact foods to avoid, if any. Fruits Any fruits prepared with added fat. Any fruits that cause symptoms. For some people this may include citrus fruits, such as oranges, grapefruit, pineapple, and lemons. Vegetables Deep-fried vegetables. [place] fries. Any vegetables prepared with added fat. Any vegetables that cause symptoms. For some people, this may include tomatoes and tomato products, chili peppers, onions and garlic, and horseradish. Grains Pastries or quick breads with added fat. Meats and other proteins High-fat meats, such as fatty beef or pork, hot dogs, ribs, ham, sausage, salami, and bacon. Fried meat or protein, including fried fish and fried chicken. Nuts and nut butters, in large amounts. Dairy Whole milk and chocolate milk. Sour cream. Cream. Ice cream.  Cream cheese. Milkshakes. Fats and oils Butter. Margarine. Shortening. Ghee. Beverages Coffee and tea, with or without caffeine. Carbonated beverages. Sodas. Energy drinks. Fruit juice made with acidic fruits, such as orange or grapefruit. Tomato juice. Alcoholic drinks. Sweets and desserts Chocolate and cocoa. Donuts. Seasonings and condiments Pepper. Peppermint and spearmint. Added salt. Any condiments, herbs, or seasonings that cause symptoms. For some people, this may include curry, hot sauce, or vinegar-based salad dressings. The items listed above may not be a complete list of foods and beverages to avoid. Contact a dietitian for more information. Questions to ask your health care provider Diet and lifestyle changes are usually the first steps that are taken to manage symptoms of GERD. If diet and lifestyle changes do not improve your symptoms, talk with your health care provider about taking medicines. Where to find more  information  International Foundation for Gastrointestinal Disorders: aboutgerd.org Summary  When you have gastroesophageal reflux disease (GERD), food and lifestyle choices may be very helpful in easing the discomfort of GERD.  Eat frequent, small meals instead of three large meals each day. Eat your meals slowly, in a relaxed setting. Avoid bending over or lying down until 2-3 hours after eating.  Limit high-fat foods such as fatty meats or fried foods. This information is not intended to replace advice given to you by your health care provider. Make sure you discuss any questions you have with your health care provider. Document Revised: 11/20/2019 Document Reviewed: 11/20/2019 Elsevier Patient Education  [place].

## 2020-10-08 LAB — CBC
HCT: 43.4 % (ref 39.0–52.0)
Hemoglobin: 15.1 g/dL (ref 13.0–17.0)
MCHC: 34.7 g/dL (ref 30.0–36.0)
MCV: 85.6 fl (ref 78.0–100.0)
Platelets: 213 10*3/uL (ref 150.0–400.0)
RBC: 5.07 Mil/uL (ref 4.22–5.81)
RDW: 13.4 % (ref 11.5–15.5)
WBC: 7.2 10*3/uL (ref 4.0–10.5)

## 2020-10-08 LAB — COMPREHENSIVE METABOLIC PANEL
ALT: 17 U/L (ref 0–53)
AST: 19 U/L (ref 0–37)
Albumin: 4.6 g/dL (ref 3.5–5.2)
Alkaline Phosphatase: 89 U/L (ref 39–117)
BUN: 12 mg/dL (ref 6–23)
CO2: 25 mEq/L (ref 19–32)
Calcium: 9.8 mg/dL (ref 8.4–10.5)
Chloride: 103 mEq/L (ref 96–112)
Creatinine, Ser: 1.01 mg/dL (ref 0.40–1.50)
GFR: 71.41 mL/min (ref >60.00)
Glucose, Bld: 76 mg/dL (ref 70–99)
Potassium: 3.7 mEq/L (ref 3.5–5.1)
Sodium: 140 mEq/L (ref 135–145)
Total Bilirubin: 0.8 mg/dL (ref 0.2–1.2)
Total Protein: 7.7 g/dL (ref 6.0–8.3)

## 2020-10-08 LAB — LIPID PANEL
Cholesterol: 232 mg/dL — ABNORMAL HIGH (ref 0–200)
HDL: 59.4 mg/dL (ref >39.00)
LDL Cholesterol: 144 mg/dL — ABNORMAL HIGH (ref 0–99)
NonHDL: 172.96
Total CHOL/HDL Ratio: 4
Triglycerides: 145 mg/dL (ref 0.0–149.0)
VLDL: 29 mg/dL (ref 0.0–40.0)

## 2021-02-14 ENCOUNTER — Encounter: Payer: Self-pay | Admitting: Family Medicine

## 2021-02-14 ENCOUNTER — Other Ambulatory Visit: Payer: Self-pay

## 2021-02-14 ENCOUNTER — Ambulatory Visit (INDEPENDENT_AMBULATORY_CARE_PROVIDER_SITE_OTHER): Payer: Medicare Other | Admitting: Family Medicine

## 2021-02-14 VITALS — BP 122/78 | HR 57 | Ht 66.0 in | Wt 154.0 lb

## 2021-02-14 DIAGNOSIS — R1032 Left lower quadrant pain: Secondary | ICD-10-CM

## 2021-02-14 DIAGNOSIS — Z8719 Personal history of other diseases of the digestive system: Secondary | ICD-10-CM | POA: Diagnosis not present

## 2021-02-14 LAB — POCT URINALYSIS DIP (MANUAL ENTRY)
Bilirubin, UA: NEGATIVE
Blood, UA: NEGATIVE
Glucose, UA: NEGATIVE mg/dL
Ketones, POC UA: NEGATIVE mg/dL
Leukocytes, UA: NEGATIVE
Nitrite, UA: NEGATIVE
Protein Ur, POC: NEGATIVE mg/dL
Spec Grav, UA: 1.01 (ref 1.010–1.025)
Urobilinogen, UA: 0.2 E.U./dL
pH, UA: 6 (ref 5.0–8.0)

## 2021-02-14 MED ORDER — AMOXICILLIN-POT CLAVULANATE 875-125 MG PO TABS: 1.0000 | ORAL_TABLET | Freq: Two times a day (BID) | ORAL | 0 refills | Status: DC

## 2021-02-14 NOTE — Patient Instructions (Signed)
As we discussed your abdominal discomfort could be diverticulitis but appears to be mild at this time.  Based on some newer information it is reasonable to initially try a fluid only diet for the next few days to see if your symptoms will improve.  If not improving in the next few days I would recommend starting the Augmentin as we discussed.  If not improving into next week, or any worsening abdominal pain, nausea, vomiting, fevers or chills, be seen right away as CT scan will be needed at that time.  Please let me know if there are questions and I hope you feel better soon.  Diverticulitis Diverticulitis is infection or inflammation of small pouches (diverticula) in the colon that form due to a condition called diverticulosis. Diverticula can trap stool (feces) and bacteria, causing infection and inflammation. Diverticulitis may cause severe stomach pain and diarrhea. It may lead to tissue damage in the colon that causes bleeding or blockage. The diverticula may also burst (rupture) and cause infected stool to enter other areas of the abdomen. What are the causes? This condition is caused by stool becoming trapped in the diverticula, which allows bacteria to grow in the diverticula. This leads to inflammation and infection. What increases the risk? You are more likely to develop this condition if you have diverticulosis. The risk increases if you: Are overweight or obese. Do not get enough exercise. Drink alcohol. Use tobacco products. Eat a diet that has a lot of red meat such as beef, pork, or lamb. Eat a diet that does not include enough fiber. High-fiber foods include fruits, vegetables, beans, nuts, and whole grains. Are over 71 years of age. What are the signs or symptoms? Symptoms of this condition may include: Pain and tenderness in the abdomen. The pain is normally located on the left side of the abdomen, but it may occur in other areas. Fever and  chills. Nausea. Vomiting. Cramping. Bloating. Changes in bowel routines. Blood in your stool. How is this diagnosed? This condition is diagnosed based on: Your medical history. A physical exam. Tests to make sure there is nothing else causing your condition. These tests may include: Blood tests. Urine tests. CT scan of the abdomen. How is this treated? Most cases of this condition are mild and can be treated at home. Treatment may include: Taking over-the-counter pain medicines. Following a clear liquid diet. Taking antibiotic medicines by mouth. Resting. More severe cases may need to be treated at a hospital. Treatment may include: Not eating or drinking. Taking prescription pain medicine. Receiving antibiotic medicines through an IV. Receiving fluids and nutrition through an IV. Surgery. When your condition is under control, your health care provider may recommend that you have a colonoscopy. This is an exam to look at the entire large intestine. During the exam, a lubricated, bendable tube is inserted into the anus and then passed into the rectum, colon, and other parts of the large intestine. A colonoscopy can show how severe your diverticula are and whether something else may be causing your symptoms. Follow these instructions at home: Medicines Take over-the-counter and prescription medicines only as told by your health care provider. These include fiber supplements, probiotics, and stool softeners. If you were prescribed an antibiotic medicine, take it as told by your health care provider. Do not stop taking the antibiotic even if you start to feel better. Ask your health care provider if the medicine prescribed to you requires you to avoid driving or using machinery. Eating and drinking  Follow a full liquid diet or another diet as directed by your health care provider. After your symptoms improve, your health care provider may tell you to change your diet. He or she may  recommend that you eat a diet that contains at least 25 grams (25 g) of fiber daily. Fiber makes it easier to pass stool. Healthy sources of fiber include: Berries. One cup contains 4-8 grams of fiber. Beans or lentils. One-half cup contains 5-8 grams of fiber. Green vegetables. One cup contains 4 grams of fiber. Avoid eating red meat. General instructions Do not use any products that contain nicotine or tobacco, such as cigarettes, e-cigarettes, and chewing tobacco. If you need help quitting, ask your health care provider. Exercise for at least 30 minutes, 3 times each week. You should exercise hard enough to raise your heart rate and break a sweat. Keep all follow-up visits as told by your health care provider. This is important. You may need to have a colonoscopy. Contact a health care provider if: Your pain does not improve. Your bowel movements do not return to normal. Get help right away if: Your pain gets worse. Your symptoms do not get better with treatment. Your symptoms suddenly get worse. You have a fever. You vomit more than one time. You have stools that are bloody, black, or tarry. Summary Diverticulitis is infection or inflammation of small pouches (diverticula) in the colon that form due to a condition called diverticulosis. Diverticula can trap stool (feces) and bacteria, causing infection and inflammation. You are at higher risk for this condition if you have diverticulosis and you eat a diet that does not include enough fiber. Most cases of this condition are mild and can be treated at home. More severe cases may need to be treated at a hospital. When your condition is under control, your health care provider may recommend that you have an exam called a colonoscopy. This exam can show how severe your diverticula are and whether something else may be causing your symptoms. Keep all follow-up visits as told by your health care provider. This is important. This information is  not intended to replace advice given to you by your health care provider. Make sure you discuss any questions you have with your health care provider. Document Revised: 02/20/2019 Document Reviewed: 02/20/2019 Elsevier Patient Education  2022 [employer].

## 2021-02-14 NOTE — Progress Notes (Signed)
Subjective:  Patient ID: Evan Lopez, male    DOB: 30-Mar-1941  Age: 80 y.o. MRN: [id number]  CC:  Chief Complaint  Patient presents with   Diverticulitis    HPI Evan Lopez presents for   Abdominal pain Constipated about 2 months ago. Some straining at that time. Took some imodium. Bowels overall normal since then . Few episodes of diarrhea at times, not consistent. No n/v, no fever. Last BM - soft stool - normal. Diarrhea 2 days ago. No recent straining or hard stools. Eating normally. Lower left abdominal soreness off and on for past month or so, only slight increased soreness past few weeks. Feels similar to prior diverticulitis.  No otc treatments recently.   History of diverticulitis, treated with Augmentin in 2018. Colonoscopy 2014 with Dr. Amedeo Plenty, few diverticula in the sigmoid colon, and descending colon, and transverse colon, but otherwise without abnormality. Has been doing well past few years.  Remote hx of nephrolith - none recent.  No hematuria or new urinary symptoms.   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 Authorizing Provider  omeprazole (PRILOSEC) 20 MG capsule Take 20 mg by mouth 2 (two) times daily.    [provider]   Social History   Socioeconomic History   Marital status: Married     Spouse name: Not on file   Number of children: Not on file   Years of education: Not on file   Highest education level: Not on file  Occupational History   Not on file  Tobacco Use   Smoking status: Never   Smokeless tobacco: Never  Substance and Sexual Activity   Alcohol use: No    Alcohol/week: 0.0 standard drinks   Drug use: No   Sexual activity: Yes  Other Topics Concern   Not on file  Social History Narrative   Not on file   Social Determinants of Health   Financial Resource Strain: Not on file  Food Insecurity: Not on file  Transportation Needs: Not on file  Physical Activity: Not on file  Stress: Not on file  Social Connections: Not on file  Intimate Partner Violence: Not on file    Review of Systems  Per HPI  Objective:   Vitals:   02/14/21 1137  BP: 122/78  Pulse: (!) 57  SpO2: 99%  Weight: 154 lb (69.9 kg)  Height: 5\' 6"  (1.676 m)     Physical Exam Vitals reviewed.  Constitutional:      Appearance: He is well-developed.  HENT:     Head: Normocephalic and atraumatic.  Neck:     Vascular: No carotid bruit or JVD.  Cardiovascular:     Rate and Rhythm: Normal rate and regular rhythm.     Heart sounds: Normal heart sounds. No murmur heard. Pulmonary:  Effort: Pulmonary effort is normal.     Breath sounds: Normal breath sounds. No rales.  Abdominal:     General: There is no distension.     Palpations: There is no mass.     Tenderness: There is abdominal tenderness (LLQ, minimal with deep palpation). There is no right CVA tenderness, left CVA tenderness, guarding or rebound.  Musculoskeletal:     Right lower leg: No edema.     Left lower leg: No edema.  Skin:    General: Skin is warm and dry.  Neurological:     Mental Status: He is alert and oriented to person, place, and time.  Psychiatric:        Mood and Affect: Mood normal.     Results for orders placed or performed in visit on 02/14/21  POCT urinalysis dipstick  Result Value Ref  Range   Color, UA yellow yellow   Clarity, UA     Glucose, UA negative negative mg/dL   Bilirubin, UA negative negative   Ketones, POC UA negative negative mg/dL   Spec Grav, UA 1.010 1.010 - 1.025   Blood, UA negative negative   pH, UA 6.0 5.0 - 8.0   Protein Ur, POC negative negative mg/dL   Urobilinogen, UA 0.2 0.2 or 1.0 E.U./dL   Nitrite, UA Negative Negative   Leukocytes, UA Negative Negative     Assessment & Plan:  Evan Lopez is a 80 y.o. male . LLQ abdominal pain - Plan: CBC, POCT urinalysis dipstick, amoxicillin-clavulanate (AUGMENTIN) 875-125 MG tablet  History of diverticulitis - Plan: CBC, POCT urinalysis dipstick, amoxicillin-clavulanate (AUGMENTIN) 875-125 MG tablet  Persistent left lower quadrant abdominal pain, intermittent discomfort over the past month or 2, possibly worse past few weeks.  Similar symptoms as previous diverticulitis.  Denies recent constipation.  Denies hematuria, unlikely nephrolithiasis.  No change in urination.    This is likely mild diverticulitis based on location of symptoms and some loose stools previously.  Discussed [place] trial, option of initial fluids, close monitoring, start Augmentin if not improving in the next few days.  ER precautions/RTC precautions given for possible imaging if any worsening of symptoms or not improving into next week.  Understanding expressed.   Meds ordered this encounter  Medications   amoxicillin-clavulanate (AUGMENTIN) 875-125 MG tablet    Sig: Take 1 tablet by mouth 2 (two) times daily.    Dispense:  20 tablet    Refill:  0    Ok to place on hold initially    Patient Instructions  As we discussed your abdominal discomfort could be diverticulitis but appears to be mild at this time.  Based on some newer information it is reasonable to initially try a fluid only diet for the next few days to see if your symptoms will improve.  If not improving in the next few days I would recommend starting the  Augmentin as we discussed.  If not improving into next week, or any worsening abdominal pain, nausea, vomiting, fevers or chills, be seen right away as CT scan will be needed at that time.  Please let me know if there are questions and I hope you feel better soon.  Diverticulitis Diverticulitis is infection or inflammation of small pouches (diverticula) in the colon that form due to a condition called diverticulosis. Diverticula can trap stool (feces) and bacteria, causing infection and inflammation. Diverticulitis may cause severe stomach pain and diarrhea. It may lead to tissue damage in the colon that causes bleeding or  blockage. The diverticula may also burst (rupture) and cause infected stool to enter other areas of the abdomen. What are the causes? This condition is caused by stool becoming trapped in the diverticula, which allows bacteria to grow in the diverticula. This leads to inflammation and infection. What increases the risk? You are more likely to develop this condition if you have diverticulosis. The risk increases if you: Are overweight or obese. Do not get enough exercise. Drink alcohol. Use tobacco products. Eat a diet that has a lot of red meat such as beef, pork, or lamb. Eat a diet that does not include enough fiber. High-fiber foods include fruits, vegetables, beans, nuts, and whole grains. Are over 44 years of age. What are the signs or symptoms? Symptoms of this condition may include: Pain and tenderness in the abdomen. The pain is normally located on the left side of the abdomen, but it may occur in other areas. Fever and chills. Nausea. Vomiting. Cramping. Bloating. Changes in bowel routines. Blood in your stool. How is this diagnosed? This condition is diagnosed based on: Your medical history. A physical exam. Tests to make sure there is nothing else causing your condition. These tests may include: Blood tests. Urine tests. CT scan of the abdomen. How is  this treated? Most cases of this condition are mild and can be treated at home. Treatment may include: Taking over-the-counter pain medicines. Following a clear liquid diet. Taking antibiotic medicines by mouth. Resting. More severe cases may need to be treated at a hospital. Treatment may include: Not eating or drinking. Taking prescription pain medicine. Receiving antibiotic medicines through an IV. Receiving fluids and nutrition through an IV. Surgery. When your condition is under control, your health care provider may recommend that you have a colonoscopy. This is an exam to look at the entire large intestine. During the exam, a lubricated, bendable tube is inserted into the anus and then passed into the rectum, colon, and other parts of the large intestine. A colonoscopy can show how severe your diverticula are and whether something else may be causing your symptoms. Follow these instructions at home: Medicines Take over-the-counter and prescription medicines only as told by your health care provider. These include fiber supplements, probiotics, and stool softeners. If you were prescribed an antibiotic medicine, take it as told by your health care provider. Do not stop taking the antibiotic even if you start to feel better. Ask your health care provider if the medicine prescribed to you requires you to avoid driving or using machinery. Eating and drinking  Follow a full liquid diet or another diet as directed by your health care provider. After your symptoms improve, your health care provider may tell you to change your diet. He or she may recommend that you eat a diet that contains at least 25 grams (25 g) of fiber daily. Fiber makes it easier to pass stool. Healthy sources of fiber include: Berries. One cup contains 4-8 grams of fiber. Beans or lentils. One-half cup contains 5-8 grams of fiber. Green vegetables. One cup contains 4 grams of fiber. Avoid eating red meat. General  instructions Do not use any products that contain nicotine or tobacco, such as cigarettes, e-cigarettes, and chewing tobacco. If you need help quitting, ask your health care provider. Exercise for at least 30 minutes, 3 times each week. You should exercise hard enough to raise your heart rate and break a sweat. Keep all follow-up visits as told by your health care provider. This is important.  You may need to have a colonoscopy. Contact a health care provider if: Your pain does not improve. Your bowel movements do not return to normal. Get help right away if: Your pain gets worse. Your symptoms do not get better with treatment. Your symptoms suddenly get worse. You have a fever. You vomit more than one time. You have stools that are bloody, black, or tarry. Summary Diverticulitis is infection or inflammation of small pouches (diverticula) in the colon that form due to a condition called diverticulosis. Diverticula can trap stool (feces) and bacteria, causing infection and inflammation. You are at higher risk for this condition if you have diverticulosis and you eat a diet that does not include enough fiber. Most cases of this condition are mild and can be treated at home. More severe cases may need to be treated at a hospital. When your condition is under control, your health care provider may recommend that you have an exam called a colonoscopy. This exam can show how severe your diverticula are and whether something else may be causing your symptoms. Keep all follow-up visits as told by your health care provider. This is important. This information is not intended to replace advice given to you by your health care provider. Make sure you discuss any questions you have with your health care provider. Document Revised: 02/20/2019 Document Reviewed: 02/20/2019 Elsevier Patient Education  2022 [place],   Merri Ray, MD [place] [employer] 02/14/21 12:19 PM

## 2021-02-17 ENCOUNTER — Other Ambulatory Visit: Payer: Self-pay

## 2021-02-17 ENCOUNTER — Other Ambulatory Visit (INDEPENDENT_AMBULATORY_CARE_PROVIDER_SITE_OTHER): Payer: Medicare Other

## 2021-02-17 DIAGNOSIS — E785 Hyperlipidemia, unspecified: Secondary | ICD-10-CM

## 2021-02-17 LAB — CBC
HCT: 42.1 % (ref 39.0–52.0)
Hemoglobin: 14.4 g/dL (ref 13.0–17.0)
MCHC: 34.1 g/dL (ref 30.0–36.0)
MCV: 86.9 fl (ref 78.0–100.0)
Platelets: 195 10*3/uL (ref 150.0–400.0)
RBC: 4.84 Mil/uL (ref 4.22–5.81)
RDW: 13.1 % (ref 11.5–15.5)
WBC: 6 10*3/uL (ref 4.0–10.5)

## 2021-02-17 NOTE — Progress Notes (Signed)
CBC

## 2021-02-19 ENCOUNTER — Telehealth: Payer: Self-pay | Admitting: Family Medicine

## 2021-02-19 NOTE — Telephone Encounter (Signed)
Patient Rx has a hold on it and he would like for it to be lifted. Please advise

## 2021-02-19 NOTE — Telephone Encounter (Signed)
Patient would like you to take the hold off of the medication that you sent in to [facility]

## 2021-02-19 NOTE — Telephone Encounter (Signed)
Called pharmacy - they will fill and call patient.

## 2021-02-24 ENCOUNTER — Telehealth: Payer: Self-pay | Admitting: Family Medicine

## 2021-02-24 NOTE — Telephone Encounter (Signed)
Encounter started in error.

## 2021-03-07 ENCOUNTER — Encounter: Payer: Self-pay | Admitting: Registered Nurse

## 2021-03-07 ENCOUNTER — Ambulatory Visit (INDEPENDENT_AMBULATORY_CARE_PROVIDER_SITE_OTHER): Payer: Medicare Other | Admitting: Registered Nurse

## 2021-03-07 ENCOUNTER — Other Ambulatory Visit: Payer: Self-pay

## 2021-03-07 VITALS — BP 122/66 | HR 70 | Temp 98.3°F | Resp 18 | Ht 67.0 in | Wt 153.6 lb

## 2021-03-07 DIAGNOSIS — R350 Frequency of micturition: Secondary | ICD-10-CM | POA: Diagnosis not present

## 2021-03-07 DIAGNOSIS — S4992XA Unspecified injury of left shoulder and upper arm, initial encounter: Secondary | ICD-10-CM

## 2021-03-07 DIAGNOSIS — R1032 Left lower quadrant pain: Secondary | ICD-10-CM

## 2021-03-07 LAB — POCT URINALYSIS DIP (MANUAL ENTRY)
Bilirubin, UA: NEGATIVE
Blood, UA: NEGATIVE
Glucose, UA: NEGATIVE mg/dL
Ketones, POC UA: NEGATIVE mg/dL
Leukocytes, UA: NEGATIVE
Nitrite, UA: NEGATIVE
Protein Ur, POC: NEGATIVE mg/dL
Spec Grav, UA: 1.01 (ref 1.010–1.025)
Urobilinogen, UA: 0.2 E.U./dL
pH, UA: 5 (ref 5.0–8.0)

## 2021-03-07 MED ORDER — TAMSULOSIN HCL 0.4 MG PO CAPS
0.4000 mg | ORAL_CAPSULE | Freq: Every day | ORAL | 3 refills | Status: DC
Start: 2021-03-07 — End: 2021-03-10

## 2021-03-07 NOTE — Addendum Note (Signed)
Addended by: Maximiano Coss on: 03/07/2021 03:08 PM   Modules accepted: Orders

## 2021-03-07 NOTE — Progress Notes (Signed)
Established Patient Office Visit  Subjective:  Patient ID: Evan Lopez, male    DOB: 1940-05-31  Age: 80 y.o. MRN: [id number]  CC:  Chief Complaint  Patient presents with   Shoulder Pain    Patient states he has been having some left shoulder pain and also some back pain.Patient states he feels uncomfortable when is bladder is full as well.    HPI Evan Lopez presents for shoulder pain  L shoulder, some back pain On and off for some time - notable arthritis, Dr. Carlota Raspberry has suggested seeing ortho for steroid injection. He would prefer [facility], as his wife is a pt there.   Feeling uncomfortable when bladder is full, relief when emptying. LLQ and flank pain. Tenderness in llq No abnormal BM. Some slight dysuria No hematuria Hx notable for diverticulosis, renal calculi  Past Medical History:  Diagnosis Date   DIVERTICULOSIS OF COLON    GERD (gastroesophageal reflux disease)    Hyperlipidemia    Kidney calculi    Nonspecific abnormal finding in stool contents     Past Surgical History:  Procedure Laterality Date   APPENDECTOMY      Family History  Problem Relation Age of Onset   Cancer Mother    Alcohol abuse Father     Social History   Socioeconomic History   Marital status: Married    Spouse name: Not on file   Number of children: Not on file   Years of education: Not on file   Highest education level: Not on file  Occupational History   Not on file  Tobacco Use   Smoking status: Never   Smokeless tobacco: Never  Substance and Sexual Activity   Alcohol use: No    Alcohol/week: 0.0 standard drinks   Drug use: No   Sexual activity: Yes  Other Topics Concern   Not on file  Social History Narrative   Not on file   Social Determinants of Health   Financial Resource Strain: Not on file  Food Insecurity: Not on file  Transportation Needs: Not on file  Physical Activity: Not on file  Stress: Not on file  Social Connections: Not on  file  Intimate Partner Violence: Not on file    Outpatient Medications Prior to Visit  Medication Sig Dispense Refill   omeprazole (PRILOSEC) 20 MG capsule Take 20 mg by mouth 2 (two) times daily.     amoxicillin-clavulanate (AUGMENTIN) 875-125 MG tablet Take 1 tablet by mouth 2 (two) times daily. (Patient not taking: Reported on 03/07/2021) 20 tablet 0   No facility-administered medications prior to visit.    No Known Allergies  ROS Review of Systems  Constitutional: Negative.   HENT: Negative.    Eyes: Negative.   Respiratory: Negative.    Cardiovascular: Negative.   Gastrointestinal:  Positive for abdominal pain.  Genitourinary:  Positive for dysuria and flank pain.  Musculoskeletal:  Positive for arthralgias.  Skin: Negative.   Neurological: Negative.   Psychiatric/Behavioral: Negative.       Objective:    Physical Exam Constitutional:      General: He is not in acute distress.    Appearance: Normal appearance. He is normal weight. He is not ill-appearing, toxic-appearing or diaphoretic.  Cardiovascular:     Rate and Rhythm: Normal rate and regular rhythm.     Heart sounds: Normal heart sounds. No murmur heard.   No friction rub. No gallop.  Pulmonary:     Effort: Pulmonary effort is normal.  No respiratory distress.     Breath sounds: Normal breath sounds. No stridor. No wheezing, rhonchi or rales.  Chest:     Chest wall: No tenderness.  Abdominal:     General: Abdomen is flat. Bowel sounds are normal. There is no distension.     Palpations: Abdomen is soft. There is no mass.     Tenderness: There is abdominal tenderness (llq). There is no right CVA tenderness, left CVA tenderness, guarding or rebound.     Hernia: No hernia is present.  Neurological:     General: No focal deficit present.     Mental Status: He is alert and oriented to person, place, and time. Mental status is at baseline.  Psychiatric:        Mood and Affect: Mood normal.        Behavior:  Behavior normal.        Thought Content: Thought content normal.        Judgment: Judgment normal.    BP 122/66   Pulse 70   Temp 98.3 F (36.8 C) (Temporal)   Resp 18   Ht 5\' 7"  (1.702 m)   Wt 153 lb 9.6 oz (69.7 kg)   SpO2 99%   BMI 24.06 kg/m  Wt Readings from Last 3 Encounters:  03/07/21 153 lb 9.6 oz (69.7 kg)  02/14/21 154 lb (69.9 kg)  10/07/20 158 lb 12.8 oz (72 kg)     Health Maintenance Due  Topic Date Due   Zoster Vaccines- Shingrix (1 of 2) Never done    There are no preventive care reminders to display for this patient.  Lab Results  Component Value Date   TSH 1.900 01/07/2017   Lab Results  Component Value Date   WBC 6.0 02/17/2021   HGB 14.4 02/17/2021   HCT 42.1 02/17/2021   MCV 86.9 02/17/2021   PLT 195.0 02/17/2021   Lab Results  Component Value Date   NA 140 10/07/2020   K 3.7 10/07/2020   CO2 25 10/07/2020   GLUCOSE 76 10/07/2020   BUN 12 10/07/2020   CREATININE 1.01 10/07/2020   BILITOT 0.8 10/07/2020   ALKPHOS 89 10/07/2020   AST 19 10/07/2020   ALT 17 10/07/2020   PROT 7.7 10/07/2020   ALBUMIN 4.6 10/07/2020   CALCIUM 9.8 10/07/2020   GFR 71.41 10/07/2020   Lab Results  Component Value Date   CHOL 232 (H) 10/07/2020   Lab Results  Component Value Date   HDL 59.40 10/07/2020   Lab Results  Component Value Date   LDLCALC 144 (H) 10/07/2020   Lab Results  Component Value Date   TRIG 145.0 10/07/2020   Lab Results  Component Value Date   CHOLHDL 4 10/07/2020   Lab Results  Component Value Date   HGBA1C 5.2 04/27/2012      Assessment & Plan:   Problem List Items Addressed This Visit   None Visit Diagnoses     Urine frequency    -  Primary   Relevant Medications   tamsulosin (FLOMAX) 0.4 MG CAPS capsule   Other Relevant Orders   POCT urinalysis dipstick (Completed)   Urine Culture   CBC with Differential/Platelet   PSA   Comprehensive metabolic panel   Acute left lower quadrant pain       Relevant  Orders   CT Abdomen Pelvis W Contrast   CBC with Differential/Platelet   PSA   Comprehensive metabolic panel   Shoulder injury, left, initial encounter  Relevant Orders   AMB referral to orthopedics       Meds ordered this encounter  Medications   tamsulosin (FLOMAX) 0.4 MG CAPS capsule    Sig: Take 1 capsule (0.4 mg total) by mouth daily.    Dispense:  30 capsule    Refill:  3    Order Specific Question:   Supervising Provider    Answer:   Carlota Raspberry, JEFFREY R [[id number]]    Follow-up: Return if symptoms worsen or fail to improve.   PLAN Poct ua unremarkable. Will send culture Ct abdomen and pelvis to rule out renal stone, bowel obstruction, etc Labs Patient encouraged to call clinic with any questions, comments, or concerns.  Maximiano Coss, NP

## 2021-03-07 NOTE — Patient Instructions (Addendum)
Evan Lopez -   Doristine Devoid to see you.   Have referred for shoulder  Start flomax - might help  CT abdomen/pelvis next week, I'll call with results of this and labs   Thank you  Rich     If you have lab work done today you will be contacted with your lab results within the next 2 weeks.  If you have not heard from [place] then please contact us. The fastest way to get your results is to register for My Chart.   IF you received an x-ray today, you will receive an invoice from [facility]. Please contact [facility] at [phone] with questions or concerns regarding your invoice.   IF you received labwork today, you will receive an invoice from [facility]. Please contact [facility] at [phone] with questions or concerns regarding your invoice.   Our billing staff will not be able to assist you with questions regarding bills from these companies.  You will be contacted with the lab results as soon as they are available. The fastest way to get your results is to activate your My Chart account. Instructions are located on the last page of this paperwork. If you have not heard from [place] regarding the results in 2 weeks, please contact this office.

## 2021-03-08 LAB — COMPREHENSIVE METABOLIC PANEL
AG Ratio: 1.5 (calc) (ref 1.0–2.5)
ALT: 16 U/L (ref 9–46)
AST: 18 U/L (ref 10–35)
Albumin: 4.2 g/dL (ref 3.6–5.1)
Alkaline phosphatase (APISO): 103 U/L (ref 35–144)
BUN: 11 mg/dL (ref 7–25)
CO2: 24 mmol/L (ref 20–32)
Calcium: 9.3 mg/dL (ref 8.6–10.3)
Chloride: 106 mmol/L (ref 98–110)
Creat: 0.99 mg/dL (ref 0.70–1.28)
Globulin: 2.8 g/dL (calc) (ref 1.9–3.7)
Glucose, Bld: 74 mg/dL (ref 65–99)
Potassium: 3.9 mmol/L (ref 3.5–5.3)
Sodium: 142 mmol/L (ref 135–146)
Total Bilirubin: 0.5 mg/dL (ref 0.2–1.2)
Total Protein: 7 g/dL (ref 6.1–8.1)

## 2021-03-08 LAB — CBC WITH DIFFERENTIAL/PLATELET
Absolute Monocytes: 472 cells/uL (ref 200–950)
Basophils Absolute: 30 cells/uL (ref 0–200)
Basophils Relative: 0.5 %
Eosinophils Absolute: 142 cells/uL (ref 15–500)
Eosinophils Relative: 2.4 %
HCT: 42.1 % (ref 38.5–50.0)
Hemoglobin: 14.5 g/dL (ref 13.2–17.1)
Lymphs Abs: 1569 cells/uL (ref 850–3900)
MCH: 30.2 pg (ref 27.0–33.0)
MCHC: 34.4 g/dL (ref 32.0–36.0)
MCV: 87.7 fL (ref 80.0–100.0)
MPV: 10.4 fL (ref 7.5–12.5)
Monocytes Relative: 8 %
Neutro Abs: 3688 cells/uL (ref 1500–7800)
Neutrophils Relative %: 62.5 %
Platelets: 228 10*3/uL (ref 140–400)
RBC: 4.8 10*6/uL (ref 4.20–5.80)
RDW: 12.6 % (ref 11.0–15.0)
Total Lymphocyte: 26.6 %
WBC: 5.9 10*3/uL (ref 3.8–10.8)

## 2021-03-08 LAB — PSA: PSA: 0.74 ng/mL (ref <=4.00)

## 2021-03-09 LAB — URINE CULTURE
MICRO NUMBER:: 12504920
Result:: NO GROWTH
SPECIMEN QUALITY:: ADEQUATE

## 2021-03-10 ENCOUNTER — Other Ambulatory Visit: Payer: Self-pay

## 2021-03-10 ENCOUNTER — Telehealth: Payer: Self-pay | Admitting: Family Medicine

## 2021-03-10 DIAGNOSIS — R350 Frequency of micturition: Secondary | ICD-10-CM

## 2021-03-10 MED ORDER — TAMSULOSIN HCL 0.4 MG PO CAPS: 0.4000 mg | ORAL_CAPSULE | Freq: Every day | ORAL | 3 refills | Status: DC

## 2021-03-10 NOTE — Telephone Encounter (Signed)
This has been resent

## 2021-03-10 NOTE — Telephone Encounter (Signed)
..  Caller name:  Aviva Kluver  On DPR? :yes/no: Yes  Call back [phone]  Provider they see: Carlota Raspberry  Reason for call: Patient had requested his flomax be called into [facility] on [address] when he was seen by Delfino Lovett on 03/07/2021.  Patient has checked with pharmacy and nothing was sent in.  Please advise

## 2021-03-17 ENCOUNTER — Ambulatory Visit (INDEPENDENT_AMBULATORY_CARE_PROVIDER_SITE_OTHER): Payer: Medicare Other | Admitting: *Deleted

## 2021-03-17 DIAGNOSIS — Z0000 Encounter for general adult medical examination without abnormal findings: Secondary | ICD-10-CM

## 2021-03-17 NOTE — Patient Instructions (Signed)
Evan Lopez , Thank you for taking time to come for your Medicare Wellness Visit. I appreciate your ongoing commitment to your health goals. Please review the following plan we discussed and let me know if I can assist you in the future.   Screening recommendations/referrals: Colonoscopy: Education provided Recommended yearly ophthalmology/optometry visit for glaucoma screening and checkup Recommended yearly dental visit for hygiene and checkup  Vaccinations: Influenza vaccine: up to date Pneumococcal vaccine: up to date Tdap vaccine: Education provided Shingles vaccine: Education provided    Advanced directives: Education provided  Conditions/risks identified:   Next appointment: 04-09-2021 @ 10:40  Dr. Carlota Raspberry  Preventive Care 78 Years and Older, Male Preventive care refers to lifestyle choices and visits with your health care provider that can promote health and wellness. What does preventive care include? A yearly physical exam. This is also called an annual well check. Dental exams once or twice a year. Routine eye exams. Ask your health care provider how often you should have your eyes checked. Personal lifestyle choices, including: Daily care of your teeth and gums. Regular physical activity. Eating a healthy diet. Avoiding tobacco and drug use. Limiting alcohol use. Practicing safe sex. Taking low doses of aspirin every day. Taking vitamin and mineral supplements as recommended by your health care provider. What happens during an annual well check? The services and screenings done by your health care provider during your annual well check will depend on your age, overall health, lifestyle risk factors, and family history of disease. Counseling  Your health care provider may ask you questions about your: Alcohol use. Tobacco use. Drug use. Emotional well-being. Home and relationship well-being. Sexual activity. Eating habits. History of falls. Memory and ability  to understand (cognition). Work and work Statistician. Screening  You may have the following tests or measurements: Height, weight, and BMI. Blood pressure. Lipid and cholesterol levels. These may be checked every 5 years, or more frequently if you are over 78 years old. Skin check. Lung cancer screening. You may have this screening every year starting at age 78 if you have a 30-pack-year history of smoking and currently smoke or have quit within the past 15 years. Fecal occult blood test (FOBT) of the stool. You may have this test every year starting at age 78. Flexible sigmoidoscopy or colonoscopy. You may have a sigmoidoscopy every 5 years or a colonoscopy every 10 years starting at age 78. Prostate cancer screening. Recommendations will vary depending on your family history and other risks. Hepatitis C blood test. Hepatitis B blood test. Sexually transmitted disease (STD) testing. Diabetes screening. This is done by checking your blood sugar (glucose) after you have not eaten for a while (fasting). You may have this done every 1-3 years. Abdominal aortic aneurysm (AAA) screening. You may need this if you are a current or former smoker. Osteoporosis. You may be screened starting at age 78 if you are at high risk. Talk with your health care provider about your test results, treatment options, and if necessary, the need for more tests. Vaccines  Your health care provider may recommend certain vaccines, such as: Influenza vaccine. This is recommended every year. Tetanus, diphtheria, and acellular pertussis (Tdap, Td) vaccine. You may need a Td booster every 10 years. Zoster vaccine. You may need this after age 52. Pneumococcal 13-valent conjugate (PCV13) vaccine. One dose is recommended after age 51. Pneumococcal polysaccharide (PPSV23) vaccine. One dose is recommended after age 28. Talk to your health care provider about which screenings and vaccines  you need and how often you need  them. This information is not intended to replace advice given to you by your health care provider. Make sure you discuss any questions you have with your health care provider. Document Released: 06/07/2015 Document Revised: 01/29/2016 Document Reviewed: 03/12/2015 Elsevier Interactive Patient Education  2017 [place] Prevention in the Home Falls can cause injuries. They can happen to people of all ages. There are many things you can do to make your home safe and to help prevent falls. What can I do on the outside of my home? Regularly fix the edges of walkways and driveways and fix any cracks. Remove anything that might make you trip as you walk through a door, such as a raised step or threshold. Trim any bushes or trees on the path to your home. Use bright outdoor lighting. Clear any walking paths of anything that might make someone trip, such as rocks or tools. Regularly check to see if handrails are loose or broken. Make sure that both sides of any steps have handrails. Any raised decks and porches should have guardrails on the edges. Have any leaves, snow, or ice cleared regularly. Use sand or salt on walking paths during winter. Clean up any spills in your garage right away. This includes oil or grease spills. What can I do in the bathroom? Use night lights. Install grab bars by the toilet and in the tub and shower. Do not use towel bars as grab bars. Use non-skid mats or decals in the tub or shower. If you need to sit down in the shower, use a plastic, non-slip stool. Keep the floor dry. Clean up any water that spills on the floor as soon as it happens. Remove soap buildup in the tub or shower regularly. Attach bath mats securely with double-sided non-slip rug tape. Do not have throw rugs and other things on the floor that can make you trip. What can I do in the bedroom? Use night lights. Make sure that you have a light by your bed that is easy to reach. Do not use  any sheets or blankets that are too big for your bed. They should not hang down onto the floor. Have a firm chair that has side arms. You can use this for support while you get dressed. Do not have throw rugs and other things on the floor that can make you trip. What can I do in the kitchen? Clean up any spills right away. Avoid walking on wet floors. Keep items that you use a lot in easy-to-reach places. If you need to reach something above you, use a strong step stool that has a grab bar. Keep electrical cords out of the way. Do not use floor polish or wax that makes floors slippery. If you must use wax, use non-skid floor wax. Do not have throw rugs and other things on the floor that can make you trip. What can I do with my stairs? Do not leave any items on the stairs. Make sure that there are handrails on both sides of the stairs and use them. Fix handrails that are broken or loose. Make sure that handrails are as long as the stairways. Check any carpeting to make sure that it is firmly attached to the stairs. Fix any carpet that is loose or worn. Avoid having throw rugs at the top or bottom of the stairs. If you do have throw rugs, attach them to the floor with carpet tape. Make sure  that you have a light switch at the top of the stairs and the bottom of the stairs. If you do not have them, ask someone to add them for you. What else can I do to help prevent falls? Wear shoes that: Do not have high heels. Have rubber bottoms. Are comfortable and fit you well. Are closed at the toe. Do not wear sandals. If you use a stepladder: Make sure that it is fully opened. Do not climb a closed stepladder. Make sure that both sides of the stepladder are locked into place. Ask someone to hold it for you, if possible. Clearly mark and make sure that you can see: Any grab bars or handrails. First and last steps. Where the edge of each step is. Use tools that help you move around (mobility aids)  if they are needed. These include: Canes. Walkers. Scooters. Crutches. Turn on the lights when you go into a dark area. Replace any light bulbs as soon as they burn out. Set up your furniture so you have a clear path. Avoid moving your furniture around. If any of your floors are uneven, fix them. If there are any pets around you, be aware of where they are. Review your medicines with your doctor. Some medicines can make you feel dizzy. This can increase your chance of falling. Ask your doctor what other things that you can do to help prevent falls. This information is not intended to replace advice given to you by your health care provider. Make sure you discuss any questions you have with your health care provider. Document Released: 03/07/2009 Document Revised: 10/17/2015 Document Reviewed: 06/15/2014 Elsevier Interactive Patient Education  2017 [employer].

## 2021-03-17 NOTE — Progress Notes (Signed)
me  Subjective:   Evan Lopez is a 80 y.o. male who presents for Medicare Annual/Subsequent preventive examination.  Review of Systems    no Cardiac Risk Factors include: advanced age (>78men, >33 women);male gender;sedentary lifestyle;obesity (BMI >30kg/m2)     Objective:    Today's Vitals   There is no height or weight on file to calculate BMI.  Advanced Directives 03/17/2021 01/07/2017 06/02/2012  Does Patient Have a Medical Advance Directive? No No Patient does not have advance directive  Would patient like information on creating a medical advance directive? No - Patient declined No - Patient declined -  Pre-existing out of facility DNR order (yellow form or pink MOST form) - - No    Current Medications (verified) Outpatient Encounter Medications as of 03/17/2021  Medication Sig   omeprazole (PRILOSEC) 20 MG capsule Take 20 mg by mouth 2 (two) times daily.   tamsulosin (FLOMAX) 0.4 MG CAPS capsule Take 1 capsule (0.4 mg total) by mouth daily.   amoxicillin-clavulanate (AUGMENTIN) 875-125 MG tablet Take 1 tablet by mouth 2 (two) times daily. (Patient not taking: No sig reported)   No facility-administered encounter medications on file as of 03/17/2021.    Allergies (verified) Patient has no known allergies.   History: Past Medical History:  Diagnosis Date   DIVERTICULOSIS OF COLON    GERD (gastroesophageal reflux disease)    Hyperlipidemia    Kidney calculi    Nonspecific abnormal finding in stool contents    Past Surgical History:  Procedure Laterality Date   APPENDECTOMY     Family History  Problem Relation Age of Onset   Cancer Mother    Alcohol abuse Father    Social History   Socioeconomic History   Marital status: Married    Spouse name: Not on file   Number of children: Not on file   Years of education: Not on file   Highest education level: Not on file  Occupational History   Not on file  Tobacco Use   Smoking status: Never   Smokeless  tobacco: Never  Vaping Use   Vaping Use: Not on file  Substance and Sexual Activity   Alcohol use: No    Alcohol/week: 0.0 standard drinks   Drug use: No   Sexual activity: Yes  Other Topics Concern   Not on file  Social History Narrative   Not on file   Social Determinants of Health   Financial Resource Strain: Low Risk    Difficulty of Paying Living Expenses: Not hard at all  Food Insecurity: No Food Insecurity   Worried About Charity fundraiser in the Last Year: Never true   [place] in the Last Year: Never true  Transportation Needs: No Transportation Needs   Lack of Transportation (Medical): No   Lack of Transportation (Non-Medical): No  Physical Activity: Inactive   Days of Exercise per Week: 0 days   Minutes of Exercise per Session: 0 min  Stress: No Stress Concern Present   Feeling of Stress : Not at all  Social Connections: Moderately Isolated   Frequency of Communication with Friends and Family: More than three times a week   Frequency of Social Gatherings with Friends and Family: More than three times a week   Attends Religious Services: Never   Marine scientist or Organizations: No   Attends Archivist Meetings: Never   Marital Status: Married    Tobacco Counseling Counseling given: Not Answered   Clinical  Intake:  Pre-visit preparation completed: Yes  Pain : No/denies pain     Nutritional Risks: None Diabetes: No  How often do you need to have someone help you when you read instructions, pamphlets, or other written materials from your doctor or pharmacy?: 1 - Never  Diabetic?no  Interpreter Needed?: No  Information entered by :: Leroy Kennedy LPN   Activities of Daily Living In your present state of health, do you have any difficulty performing the following activities: 03/17/2021 03/07/2021  Hearing? N N  Vision? N N  Difficulty concentrating or making decisions? N N  Walking or climbing stairs? N N  Dressing or  bathing? N N  Doing errands, shopping? N N  Preparing Food and eating ? N -  Using the Toilet? N -  In the past six months, have you accidently leaked urine? N -  Do you have problems with loss of bowel control? N -  Managing your Medications? N -  Managing your Finances? N -  Housekeeping or managing your Housekeeping? N -  Some recent data might be hidden    Patient Care Team: Wendie Agreste, MD as PCP - General (Family Medicine)  Indicate any recent Medical Services you may have received from other than [facility] providers in the past year (date may be approximate).     Assessment:   This is a routine wellness examination for Evan Lopez.  Hearing/Vision screen Hearing Screening - Comments:: No trouble hearing Vision Screening - Comments:: Up to date 11-9 schedule for cataract removal Dr. Tommy Rainwater  Dietary issues and exercise activities discussed: Current Exercise Habits: The patient does not participate in regular exercise at present, Intensity: Not Applicable   Goals Addressed             This Visit's Progress    Patient Stated       Exercise to decrease back pain       Depression Screen PHQ 2/9 Scores 03/07/2021 02/14/2021 10/07/2020 05/30/2020 09/22/2018 01/04/2018 12/29/2017  PHQ - 2 Score 0 0 0 0 0 0 0  PHQ- 9 Score 0 - - - - - -    Fall Risk Fall Risk  03/17/2021 02/14/2021 10/07/2020 05/30/2020 09/22/2018  Falls in the past year? 0 0 0 0 0  Number falls in past yr: 0 - - 0 0  Injury with Fall? 0 - - 0 0  Risk for fall due to : - - - No Fall Risks -  Follow up Falls evaluation completed;Falls prevention discussed - Falls evaluation completed Falls evaluation completed Falls evaluation completed;Education provided;Falls prevention discussed    FALL RISK PREVENTION PERTAINING TO THE HOME:  Any stairs in or around the home? No  If so, are there any without handrails? No  Home free of loose throw rugs in walkways, pet beds, electrical cords, etc? Yes  Adequate lighting  in your home to reduce risk of falls? Yes   ASSISTIVE DEVICES UTILIZED TO PREVENT FALLS:  Life alert? No  Use of a cane, walker or w/c? No  Grab bars in the bathroom? Yes  Shower chair or bench in shower? No  Elevated toilet seat or a handicapped toilet? No   TIMED UP AND GO:  Was the test performed? No .    Cognitive Function:  Normal cognitive status assessed by direct observation by this Nurse Health Advisor. No abnormalities found.       6CIT Screen 09/22/2018  What Year? 0 points  What month? 0 points  What time?  0 points  Count back from 20 0 points  Months in reverse 0 points  Repeat phrase 0 points  Total Score 0    Immunizations Immunization History  Administered Date(s) Administered   Influenza Split 06/24/2011, 03/24/2012   Influenza, High Dose Seasonal PF 02/18/2017, 02/08/2018   Influenza,inj,Quad PF,6+ Mos 01/28/2016   Influenza-Unspecified 02/22/2017, 02/22/2020   Pneumococcal Conjugate-13 01/07/2017   Pneumococcal Polysaccharide-23 10/07/2020    TDAP status: Due, Education has been provided regarding the importance of this vaccine. Advised may receive this vaccine at local pharmacy or Health Dept. Aware to provide a copy of the vaccination record if obtained from local pharmacy or Health Dept. Verbalized acceptance and understanding.  Flu Vaccine status: Up to date  Pneumococcal vaccine status: Up to date  Covid-19 vaccine status: Completed vaccines  Qualifies for Shingles Vaccine? Yes   Zostavax completed No   Shingrix Completed?: No.    Education has been provided regarding the importance of this vaccine. Patient has been advised to call insurance company to determine out of pocket expense if they have not yet received this vaccine. Advised may also receive vaccine at local pharmacy or Health Dept. Verbalized acceptance and understanding.  Screening Tests Health Maintenance  Topic Date Due   Zoster Vaccines- Shingrix (1 of 2) Never done    COVID-19 Vaccine (1) 03/23/2021 (Originally 02/13/1941)   TETANUS/TDAP  05/30/2021 (Originally 06/24/2015)   Hepatitis C Screening  05/30/2021 (Originally 08/12/1958)   INFLUENZA VACCINE  08/22/2021 (Originally 12/23/2020)   Pneumonia Vaccine 6+ Years old  Completed   HPV VACCINES  Aged Out    Health Maintenance  Health Maintenance Due  Topic Date Due   Zoster Vaccines- Shingrix (1 of 2) Never done    Colonoscopy  Patient is having CT will speak to doctor after results.  Lung Cancer Screening: (Low Dose CT Chest recommended if Age 15-80 years, 30 pack-year currently smoking OR have quit w/in 15years.) does not qualify.   Lung Cancer Screening Referral:   Additional Screening:  Hepatitis C Screening: does not qualify;   Vision Screening: Recommended annual ophthalmology exams for early detection of glaucoma and other disorders of the eye. Is the patient up to date with their annual eye exam?  Yes  Who is the provider or what is the name of the office in which the patient attends annual eye exams? Dr. Tommy Rainwater If pt is not established with a provider, would they like to be referred to a provider to establish care? No .   Dental Screening: Recommended annual dental exams for proper oral hygiene  Community Resource Referral / Chronic Care Management: CRR required this visit?  No   CCM required this visit?  No      Plan:     I have personally reviewed and noted the following in the patient's chart:   Medical and social history Use of alcohol, tobacco or illicit drugs  Current medications and supplements including opioid prescriptions. Patient is not currently taking opioid prescriptions. Functional ability and status Nutritional status Physical activity Advanced directives List of other physicians Hospitalizations, surgeries, and ER visits in previous 12 months Vitals Screenings to include cognitive, depression, and falls Referrals and appointments  In addition, I have  reviewed and discussed with patient certain preventive protocols, quality metrics, and best practice recommendations. A written personalized care plan for preventive services as well as general preventive health recommendations were provided to patient.     Leroy Kennedy, LPN   [id number]   Nurse Notes

## 2021-03-21 ENCOUNTER — Ambulatory Visit
Admission: RE | Admit: 2021-03-21 | Discharge: 2021-03-21 | Disposition: A | Discharge status: Home / Self Care | Payer: Medicare Other | Source: Ambulatory Visit | Attending: Registered Nurse | Admitting: Registered Nurse

## 2021-03-21 DIAGNOSIS — R1032 Left lower quadrant pain: Secondary | ICD-10-CM

## 2021-03-21 MED ORDER — IOPAMIDOL (ISOVUE-300) INJECTION 61%
100.0000 mL | Freq: Once | INTRAVENOUS | Status: AC | PRN
  Administered 2021-03-21: 100 mL via INTRAVENOUS

## 2021-03-24 ENCOUNTER — Encounter: Payer: Self-pay | Admitting: Registered Nurse

## 2021-03-24 DIAGNOSIS — I700 Atherosclerosis of aorta: Secondary | ICD-10-CM | POA: Insufficient documentation

## 2021-03-28 ENCOUNTER — Ambulatory Visit (INDEPENDENT_AMBULATORY_CARE_PROVIDER_SITE_OTHER): Payer: Medicare Other | Admitting: Registered Nurse

## 2021-03-28 ENCOUNTER — Other Ambulatory Visit: Payer: Self-pay

## 2021-03-28 ENCOUNTER — Telehealth: Payer: Self-pay

## 2021-03-28 ENCOUNTER — Encounter: Payer: Self-pay | Admitting: Registered Nurse

## 2021-03-28 VITALS — BP 126/74 | HR 65 | Temp 98.0°F | Resp 18 | Ht 67.0 in | Wt 153.6 lb

## 2021-03-28 DIAGNOSIS — M545 Low back pain, unspecified: Secondary | ICD-10-CM

## 2021-03-28 DIAGNOSIS — R1032 Left lower quadrant pain: Secondary | ICD-10-CM | POA: Diagnosis not present

## 2021-03-28 MED ORDER — CIPROFLOXACIN HCL 500 MG PO TABS: 500.0000 mg | ORAL_TABLET | Freq: Two times a day (BID) | ORAL | 0 refills | Status: AC

## 2021-03-28 MED ORDER — METRONIDAZOLE 500 MG PO TABS: 500.0000 mg | ORAL_TABLET | Freq: Three times a day (TID) | ORAL | 0 refills | Status: AC

## 2021-03-28 MED ORDER — METHOCARBAMOL 500 MG PO TABS: 500.0000 mg | ORAL_TABLET | Freq: Two times a day (BID) | ORAL | 1 refills | Status: DC | PRN

## 2021-03-28 NOTE — Progress Notes (Signed)
Established Patient Office Visit  Subjective:  Patient ID: Evan Lopez, male    DOB: [birth year]  Age: 80 y.o. MRN: [id number]  CC:  Chief Complaint  Patient presents with   Abdominal Pain    Patient states he has been having some abdominal pain and back pain. Pt states he was sent to go to get a CT scan and was told nothing was found. He think it could be related to medications.    HPI Evan Lopez presents for ongoing abdominal pain  Llq and l flank No acute injury or trauma Labs were unremarkable Hx of renal stones, but CT unremarkable except for diverticulosis, small L inguinal hernia, and aortic atherosclerosis No urinary or GI symptoms.  History of diverticulosis with multiple itis flares - feels very similar to these.   Past Medical History:  Diagnosis Date   DIVERTICULOSIS OF COLON    GERD (gastroesophageal reflux disease)    Hyperlipidemia    Kidney calculi    Nonspecific abnormal finding in stool contents     Past Surgical History:  Procedure Laterality Date   APPENDECTOMY      Family History  Problem Relation Age of Onset   Cancer Mother    Alcohol abuse Father     Social History   Socioeconomic History   Marital status: Married    Spouse name: Not on file   Number of children: Not on file   Years of education: Not on file   Highest education level: Not on file  Occupational History   Not on file  Tobacco Use   Smoking status: Never   Smokeless tobacco: Never  Vaping Use   Vaping Use: Not on file  Substance and Sexual Activity   Alcohol use: No    Alcohol/week: 0.0 standard drinks   Drug use: No   Sexual activity: Yes  Other Topics Concern   Not on file  Social History Narrative   Not on file   Social Determinants of Health   Financial Resource Strain: Low Risk    Difficulty of Paying Living Expenses: Not hard at all  Food Insecurity: No Food Insecurity   Worried About Charity fundraiser in the Last Year: Never true    [place] in the Last Year: Never true  Transportation Needs: No Transportation Needs   Lack of Transportation (Medical): No   Lack of Transportation (Non-Medical): No  Physical Activity: Inactive   Days of Exercise per Week: 0 days   Minutes of Exercise per Session: 0 min  Stress: No Stress Concern Present   Feeling of Stress : Not at all  Social Connections: Moderately Isolated   Frequency of Communication with Friends and Family: More than three times a week   Frequency of Social Gatherings with Friends and Family: More than three times a week   Attends Religious Services: Never   Marine scientist or Organizations: No   Attends Music therapist: Never   Marital Status: Married  Human resources officer Violence: Not At Risk   Fear of Current or Ex-Partner: No   Emotionally Abused: No   Physically Abused: No   Sexually Abused: No    Outpatient Medications Prior to Visit  Medication Sig Dispense Refill   omeprazole (PRILOSEC) 20 MG capsule Take 20 mg by mouth 2 (two) times daily.     tamsulosin (FLOMAX) 0.4 MG CAPS capsule Take 1 capsule (0.4 mg total) by mouth daily. 30 capsule 3  amoxicillin-clavulanate (AUGMENTIN) 875-125 MG tablet Take 1 tablet by mouth 2 (two) times daily. (Patient not taking: No sig reported) 20 tablet 0   No facility-administered medications prior to visit.    No Known Allergies  ROS Review of Systems  Constitutional: Negative.   HENT: Negative.    Eyes: Negative.   Respiratory: Negative.    Cardiovascular: Negative.   Gastrointestinal:  Positive for abdominal pain. Negative for abdominal distention, anal bleeding, blood in stool, constipation, diarrhea, nausea, rectal pain and vomiting.  Genitourinary: Negative.   Musculoskeletal: Negative.   Skin: Negative.   Neurological: Negative.   Psychiatric/Behavioral: Negative.    All other systems reviewed and are negative.    Objective:    Physical Exam Constitutional:       General: He is not in acute distress.    Appearance: Normal appearance. He is well-developed and normal weight. He is not ill-appearing, toxic-appearing or diaphoretic.  Cardiovascular:     Rate and Rhythm: Normal rate and regular rhythm.     Heart sounds: Normal heart sounds. No murmur heard.   No friction rub. No gallop.  Pulmonary:     Effort: Pulmonary effort is normal. No respiratory distress.     Breath sounds: Normal breath sounds. No stridor. No wheezing, rhonchi or rales.  Chest:     Chest wall: No tenderness.  Abdominal:     Palpations: Abdomen is soft. There is no shifting dullness, fluid wave, hepatomegaly, splenomegaly, mass or pulsatile mass.     Tenderness: There is abdominal tenderness in the left lower quadrant. There is guarding and rebound. There is no right CVA tenderness or left CVA tenderness. Negative signs include Murphy's sign, Rovsing's sign, McBurney's sign, psoas sign and obturator sign.  Neurological:     General: No focal deficit present.     Mental Status: He is alert and oriented to person, place, and time. Mental status is at baseline.  Psychiatric:        Mood and Affect: Mood normal.        Behavior: Behavior normal.        Thought Content: Thought content normal.        Judgment: Judgment normal.    BP 126/74   Pulse 65   Temp 98 F (36.7 C) (Temporal)   Resp 18   Ht 5\' 7"  (1.702 m)   Wt 153 lb 9.6 oz (69.7 kg)   SpO2 99%   BMI 24.06 kg/m  Wt Readings from Last 3 Encounters:  03/28/21 153 lb 9.6 oz (69.7 kg)  03/07/21 153 lb 9.6 oz (69.7 kg)  02/14/21 154 lb (69.9 kg)     Health Maintenance Due  Topic Date Due   COVID-19 Vaccine (1) Never done   Zoster Vaccines- Shingrix (1 of 2) Never done    There are no preventive care reminders to display for this patient.  Lab Results  Component Value Date   TSH 1.900 01/07/2017   Lab Results  Component Value Date   WBC 5.9 03/07/2021   HGB 14.5 03/07/2021   HCT 42.1 03/07/2021   MCV  87.7 03/07/2021   PLT 228 03/07/2021   Lab Results  Component Value Date   NA 142 03/07/2021   K 3.9 03/07/2021   CO2 24 03/07/2021   GLUCOSE 74 03/07/2021   BUN 11 03/07/2021   CREATININE 0.99 03/07/2021   BILITOT 0.5 03/07/2021   ALKPHOS 89 10/07/2020   AST 18 03/07/2021   ALT 16 03/07/2021   PROT 7.0  03/07/2021   ALBUMIN 4.6 10/07/2020   CALCIUM 9.3 03/07/2021   GFR 71.41 10/07/2020   Lab Results  Component Value Date   CHOL 232 (H) 10/07/2020   Lab Results  Component Value Date   HDL 59.40 10/07/2020   Lab Results  Component Value Date   LDLCALC 144 (H) 10/07/2020   Lab Results  Component Value Date   TRIG 145.0 10/07/2020   Lab Results  Component Value Date   CHOLHDL 4 10/07/2020   Lab Results  Component Value Date   HGBA1C 5.2 04/27/2012      Assessment & Plan:   Problem List Items Addressed This Visit   None Visit Diagnoses     LLQ pain    -  Primary   Relevant Medications   ciprofloxacin (CIPRO) 500 MG tablet   metroNIDAZOLE (FLAGYL) 500 MG tablet   Acute bilateral low back pain without sciatica       Relevant Medications   methocarbamol (ROBAXIN) 500 MG tablet       Meds ordered this encounter  Medications   ciprofloxacin (CIPRO) 500 MG tablet    Sig: Take 1 tablet (500 mg total) by mouth 2 (two) times daily for 10 days.    Dispense:  14 tablet    Refill:  0    Order Specific Question:   Supervising Provider    Answer:   Carlota Raspberry, JEFFREY R [[id number]]   metroNIDAZOLE (FLAGYL) 500 MG tablet    Sig: Take 1 tablet (500 mg total) by mouth 3 (three) times daily for 7 days.    Dispense:  21 tablet    Refill:  0    Order Specific Question:   Supervising Provider    Answer:   Carlota Raspberry, JEFFREY R [[id number]]   methocarbamol (ROBAXIN) 500 MG tablet    Sig: Take 1 tablet (500 mg total) by mouth 2 (two) times daily as needed for muscle spasms.    Dispense:  30 tablet    Refill:  1    Order Specific Question:   Supervising Provider    Answer:   Carlota Raspberry,  JEFFREY R [[id number]]    Follow-up: Return if symptoms worsen or fail to improve.   PLAN Unfortunately symptoms seem to continue to suggest diverticulitis despite reassuring imaging and nonpharm treatment pursued. Will try ab as above Other possibility would include msk pain. Discussed conservative therapy and use of methocarbamol as above Discussed safety, risks, adverse events, and alternatives to medications. Pt voices understanding Patient encouraged to call clinic with any questions, comments, or concerns.  Maximiano Coss, NP

## 2021-03-28 NOTE — Patient Instructions (Addendum)
Evan Lopez -   Doristine Devoid to see you, sorry this is still going on  Try the abx as prescribed. Lots of fluids in the diet.  Can try muscle relaxer - may make you a little sedated.  Use all meds with caution as discussed  I will be in touch  Thank you  Evan Lopez     If you have lab work done today you will be contacted with your lab results within the next 2 weeks.  If you have not heard from [place] then please contact us. The fastest way to get your results is to register for My Chart.   IF you received an x-ray today, you will receive an invoice from [facility]. Please contact [facility] at [phone] with questions or concerns regarding your invoice.   IF you received labwork today, you will receive an invoice from [place]. Please contact [facility] at [phone] with questions or concerns regarding your invoice.   Our billing staff will not be able to assist you with questions regarding bills from these companies.  You will be contacted with the lab results as soon as they are available. The fastest way to get your results is to activate your My Chart account. Instructions are located on the last page of this paperwork. If you have not heard from [place] regarding the results in 2 weeks, please contact this office.

## 2021-03-28 NOTE — Telephone Encounter (Signed)
Pt is calling pt was advised to take ciprofloxacin (CIPRO) 500 MG tablet twice a day for 10 days but only 14 was wrote and pt is wanting to know if pharmacy is going to owe him more?  I called the pharmacy and they did give the patient 14 tablets and if you want him to take for 10 days a new rx needs to be written for 20 doses, this needs to be sent to his pharmacy.  Thank you.  Happy Begeman,cma

## 2021-03-28 NOTE — Telephone Encounter (Signed)
Caller name:Dinunzio, Jushua   On DPR? :no  Call back number:[phone]  Provider they see: Delfino Lovett   Reason for call:Pt is calling pt was advised to take ciprofloxacin (CIPRO) 500 MG tablet twice a day for 10 days but only 14 was wrote and pt is wanting to know if pharmacy is going to owe him more?

## 2021-03-28 NOTE — Telephone Encounter (Signed)
Noted recent CT. Will route message to prescribing provider to decide on additional Rx.

## 2021-04-07 NOTE — Telephone Encounter (Signed)
Patient states he is getting better but still having some pain and wanted to know if he needed more medication to get back to 100 percent.

## 2021-04-09 ENCOUNTER — Ambulatory Visit: Payer: Medicare Other | Admitting: Family Medicine

## 2021-05-02 ENCOUNTER — Ambulatory Visit (INDEPENDENT_AMBULATORY_CARE_PROVIDER_SITE_OTHER): Payer: Medicare Other | Admitting: Registered Nurse

## 2021-05-02 ENCOUNTER — Other Ambulatory Visit: Payer: Self-pay

## 2021-05-02 ENCOUNTER — Encounter: Payer: Self-pay | Admitting: Registered Nurse

## 2021-05-02 ENCOUNTER — Encounter: Payer: Self-pay | Admitting: Nurse Practitioner

## 2021-05-02 VITALS — BP 138/67 | HR 71 | Temp 98.3°F | Resp 18 | Ht 67.0 in | Wt 155.8 lb

## 2021-05-02 DIAGNOSIS — R399 Unspecified symptoms and signs involving the genitourinary system: Secondary | ICD-10-CM | POA: Diagnosis not present

## 2021-05-02 DIAGNOSIS — R197 Diarrhea, unspecified: Secondary | ICD-10-CM | POA: Diagnosis not present

## 2021-05-02 DIAGNOSIS — R1032 Left lower quadrant pain: Secondary | ICD-10-CM

## 2021-05-02 MED ORDER — DICYCLOMINE HCL 10 MG PO CAPS: 10.0000 mg | ORAL_CAPSULE | Freq: Three times a day (TID) | ORAL | 3 refills | Status: DC

## 2021-05-02 NOTE — Progress Notes (Signed)
Established Patient Office Visit  Subjective:  Patient ID: Evan Lopez, male    DOB: Aug 23, 1940  Age: 80 y.o. MRN: [id number]  CC:  Chief Complaint  Patient presents with   Follow-up    Patient states he is still having problems with his Diverticulitis. Patient states when his bladder is full he is feeling some pain until after he urinates.    HPI Evan Lopez presents for follow up  Ongoing abdominal pain. Originally suspected to be diverticulitis. Ruled out with imaging and tx with abx.   Still having some lower abdominal pain and llq pain.  Noting pattern - pain is worse when his bladder is full. Waxes and wanes - some days worse than others.   Notes improvement in urinary symptoms since starting flomax.  Still having oscillation between constipation and diarrhea. No blood or mucus in stool. No fevers, chills, fatigue, sweats.   Past Medical History:  Diagnosis Date   DIVERTICULOSIS OF COLON    GERD (gastroesophageal reflux disease)    Hyperlipidemia    Kidney calculi    Nonspecific abnormal finding in stool contents     Past Surgical History:  Procedure Laterality Date   APPENDECTOMY      Family History  Problem Relation Age of Onset   Cancer Mother    Alcohol abuse Father     Social History   Socioeconomic History   Marital status: Married    Spouse name: Not on file   Number of children: Not on file   Years of education: Not on file   Highest education level: Not on file  Occupational History   Not on file  Tobacco Use   Smoking status: Never   Smokeless tobacco: Never  Vaping Use   Vaping Use: Not on file  Substance and Sexual Activity   Alcohol use: No    Alcohol/week: 0.0 standard drinks   Drug use: No   Sexual activity: Yes  Other Topics Concern   Not on file  Social History Narrative   Not on file   Social Determinants of Health   Financial Resource Strain: Low Risk    Difficulty of Paying Living Expenses: Not hard at all   Food Insecurity: No Food Insecurity   Worried About Charity fundraiser in the Last Year: Never true   [place] in the Last Year: Never true  Transportation Needs: No Transportation Needs   Lack of Transportation (Medical): No   Lack of Transportation (Non-Medical): No  Physical Activity: Inactive   Days of Exercise per Week: 0 days   Minutes of Exercise per Session: 0 min  Stress: No Stress Concern Present   Feeling of Stress : Not at all  Social Connections: Moderately Isolated   Frequency of Communication with Friends and Family: More than three times a week   Frequency of Social Gatherings with Friends and Family: More than three times a week   Attends Religious Services: Never   Marine scientist or Organizations: No   Attends Music therapist: Never   Marital Status: Married  Human resources officer Violence: Not At Risk   Fear of Current or Ex-Partner: No   Emotionally Abused: No   Physically Abused: No   Sexually Abused: No    Outpatient Medications Prior to Visit  Medication Sig Dispense Refill   omeprazole (PRILOSEC) 20 MG capsule Take 20 mg by mouth 2 (two) times daily.     tamsulosin (FLOMAX) 0.4 MG CAPS  capsule Take 1 capsule (0.4 mg total) by mouth daily. 30 capsule 3   methocarbamol (ROBAXIN) 500 MG tablet Take 1 tablet (500 mg total) by mouth 2 (two) times daily as needed for muscle spasms. (Patient not taking: Reported on 05/02/2021) 30 tablet 1   No facility-administered medications prior to visit.    No Known Allergies  ROS Review of Systems  Constitutional: Negative.   HENT: Negative.    Eyes: Negative.   Respiratory: Negative.    Cardiovascular: Negative.   Gastrointestinal: Negative.   Genitourinary: Negative.   Musculoskeletal: Negative.   Skin: Negative.   Neurological: Negative.   Psychiatric/Behavioral: Negative.    All other systems reviewed and are negative.    Objective:    Physical Exam Constitutional:       General: He is not in acute distress.    Appearance: Normal appearance. He is normal weight. He is not ill-appearing, toxic-appearing or diaphoretic.  Cardiovascular:     Rate and Rhythm: Normal rate and regular rhythm.     Heart sounds: Normal heart sounds. No murmur heard.   No friction rub. No gallop.  Pulmonary:     Effort: Pulmonary effort is normal. No respiratory distress.     Breath sounds: Normal breath sounds. No stridor. No wheezing, rhonchi or rales.  Chest:     Chest wall: No tenderness.  Neurological:     General: No focal deficit present.     Mental Status: He is alert and oriented to person, place, and time. Mental status is at baseline.  Psychiatric:        Mood and Affect: Mood normal.        Behavior: Behavior normal.        Thought Content: Thought content normal.        Judgment: Judgment normal.    BP 138/67   Pulse 71   Temp 98.3 F (36.8 C) (Temporal)   Resp 18   Ht 5\' 7"  (1.702 m)   Wt 155 lb 12.8 oz (70.7 kg)   SpO2 99%   BMI 24.40 kg/m  Wt Readings from Last 3 Encounters:  05/02/21 155 lb 12.8 oz (70.7 kg)  03/28/21 153 lb 9.6 oz (69.7 kg)  03/07/21 153 lb 9.6 oz (69.7 kg)     Health Maintenance Due  Topic Date Due   COVID-19 Vaccine (1) Never done   Zoster Vaccines- Shingrix (1 of 2) Never done    There are no preventive care reminders to display for this patient.  Lab Results  Component Value Date   TSH 1.900 01/07/2017   Lab Results  Component Value Date   WBC 5.9 03/07/2021   HGB 14.5 03/07/2021   HCT 42.1 03/07/2021   MCV 87.7 03/07/2021   PLT 228 03/07/2021   Lab Results  Component Value Date   NA 142 03/07/2021   K 3.9 03/07/2021   CO2 24 03/07/2021   GLUCOSE 74 03/07/2021   BUN 11 03/07/2021   CREATININE 0.99 03/07/2021   BILITOT 0.5 03/07/2021   ALKPHOS 89 10/07/2020   AST 18 03/07/2021   ALT 16 03/07/2021   PROT 7.0 03/07/2021   ALBUMIN 4.6 10/07/2020   CALCIUM 9.3 03/07/2021   GFR 71.41 10/07/2020   Lab  Results  Component Value Date   CHOL 232 (H) 10/07/2020   Lab Results  Component Value Date   HDL 59.40 10/07/2020   Lab Results  Component Value Date   LDLCALC 144 (H) 10/07/2020   Lab Results  Component Value Date   TRIG 145.0 10/07/2020   Lab Results  Component Value Date   CHOLHDL 4 10/07/2020   Lab Results  Component Value Date   HGBA1C 5.2 04/27/2012      Assessment & Plan:   Problem List Items Addressed This Visit   None Visit Diagnoses     LLQ pain    -  Primary   Relevant Orders   Ambulatory referral to Gastroenterology   Ambulatory referral to Urology   Lower urinary tract symptoms       Relevant Orders   Ambulatory referral to Urology   Diarrhea, unspecified type       Relevant Medications   dicyclomine (BENTYL) 10 MG capsule   Other Relevant Orders   Ambulatory referral to Gastroenterology       Meds ordered this encounter  Medications   dicyclomine (BENTYL) 10 MG capsule    Sig: Take 1 capsule (10 mg total) by mouth 3 (three) times daily before meals.    Dispense:  30 capsule    Refill:  3    Order Specific Question:   Supervising Provider    Answer:   Carlota Raspberry, JEFFREY R [[id number]]     Follow-up: Return if symptoms worsen or fail to improve.   PLAN LUTS vs BPH. Given ongoing diarrhea, will refer to urology and gastro. Try bentyl daily prn for diarrhea Double flomax to 0.8mg  po qd Discussed return precautions. Pt voices understanding. Patient encouraged to call clinic with any questions, comments, or concerns.  Maximiano Coss, NP

## 2021-05-02 NOTE — Patient Instructions (Addendum)
Mr. Dia -   Doristine Devoid to see you  Referrals to Urology and Gastroenterology placed. They'll call you  Double dose of tamsulosin to 0.8mg  daily. Take at one time.  Use bentyl 10mg  up to three times daily as needed for diarrhea.  I'll CC Dr. Carlota Raspberry so he knows the game plan.   Thank you  Rich     If you have lab work done today you will be contacted with your lab results within the next 2 weeks.  If you have not heard from [place] then please contact us. The fastest way to get your results is to register for My Chart.   IF you received an x-ray today, you will receive an invoice from [facility]. Please contact [facility] at [phone] with questions or concerns regarding your invoice.   IF you received labwork today, you will receive an invoice from [facility]. Please contact [facility] at [phone] with questions or concerns regarding your invoice.   Our billing staff will not be able to assist you with questions regarding bills from these companies.  You will be contacted with the lab results as soon as they are available. The fastest way to get your results is to activate your My Chart account. Instructions are located on the last page of this paperwork. If you have not heard from [place] regarding the results in 2 weeks, please contact this office.

## 2021-05-26 NOTE — Progress Notes (Signed)
05/26/2021 Evan Lopez [id number] 08/04/1940   CHIEF COMPLAINT: LLQ pain   HISTORY OF PRESENT ILLNESS: Evan Lopez is a 81 year old male with a past medical history of hyperlipidemia, kidney stones, diverticulitis 11/2008 (confirmed by CT), 10/2012 (confirmed by CT) and  possibly in 2018 and GERD. S/P hemorrhoidectomy surgery 1984 and appendectomy in 1964.  He presents to our office today as referred by Maximiano Coss NP for further evaluation regarding LLQ.  He developed constipation 11/2020 with associated intermittent left lower quadrant abdominal pain and he was prescribed Augmentin 875 mg twice daily for 10 days for suspected diverticulitis per Dr. Carlota Raspberry.  He was seen by Maximiano Coss, NP for follow-up 03/07/2021, at that time he continued to have intermittent L LUQ pain with some bladder discomfort.  CTAP 03/21/2021 showed evidence of diffuse colonic diverticulosis without evidence of acute diverticulitis.  However, he continued to have LLQ pain. He was prescribed Cipro 500mg  po bid and Flagyl 500mg  po tid x 7 days which she completed without significant improvement.  He was prescribed Dicyclomine 10mg  on po tid PRN without significant improvement.  He describes having a mild soreness to the LLQ area, stated " it doesn't hurt that much but is sore when I press on it.  He has intermittent constipation, passes smaller stools for few days then resumes a normal bowel pattern.  He is passing a normal formed brown bowel movement daily for the past 4 to 5 days.  No rectal bleeding or black stools.  He has nonbloody diarrhea once every month or two.  No fever, sweats or chills.  No weight loss.  His persistent LLQ soreness is somewhat interfering with his quality of living.  He is concerned about his risk of cancer. His stepdaughter died at the age of 14 following surgery for diverticular disease.  He wishes to pursue a colonoscopy to rule out colorectal cancer. No GERD symptoms on Omeprazole  20 mg daily.  Colonoscopy 03/10/2013 by Dr. Teena Irani: Diverticulosis to transverse, descending and sigmoid colon No polyps  02/14/2009 by Dr. Deatra Ina: Diverticulosis in the ascending and sigmoid colon   CBC Latest Ref Rng & Units 03/07/2021 02/17/2021 10/07/2020  WBC 3.8 - 10.8 Thousand/uL 5.9 6.0 7.2  Hemoglobin 13.2 - 17.1 g/dL 14.5 14.4 15.1  Hematocrit 38.5 - 50.0 % 42.1 42.1 43.4  Platelets 140 - 400 Thousand/uL 228 195.0 213.0    CMP Latest Ref Rng & Units 03/07/2021 10/07/2020 01/07/2017  Glucose 65 - 99 mg/dL 74 76 98  BUN 7 - 25 mg/dL 11 12 14   Creatinine 0.70 - 1.28 mg/dL 0.99 1.01 1.01  Sodium 135 - 146 mmol/L 142 140 141  Potassium 3.5 - 5.3 mmol/L 3.9 3.7 4.6  Chloride 98 - 110 mmol/L 106 103 105  CO2 20 - 32 mmol/L 24 25 23   Calcium 8.6 - 10.3 mg/dL 9.3 9.8 9.7  Total Protein 6.1 - 8.1 g/dL 7.0 7.7 7.3  Total Bilirubin 0.2 - 1.2 mg/dL 0.5 0.8 0.7  Alkaline Phos 39 - 117 U/L - 89 109  AST 10 - 35 U/L 18 19 18   ALT 9 - 46 U/L 16 17 12     CTAP with contrast 03/21/2021: Colonic diverticulosis.  No active diverticulitis.  Aortic atherosclerosis.  No acute findings in the abdomen or pelvis.  Past Medical History:  Diagnosis Date   DIVERTICULOSIS OF COLON    GERD (gastroesophageal reflux disease)    Hyperlipidemia    Kidney calculi  Nonspecific abnormal finding in stool contents    Past Surgical History:  Procedure Laterality Date   APPENDECTOMY     Social History: He is married.  He is retired.  He has 1 son.  Non-smoker.  No alcohol use.  Family History: Father with history of alcoholism.  Mother had pancreatic cancer.  Sister had breast cancer.  No Known Allergies    Outpatient Encounter Medications as of 05/27/2021  Medication Sig   dicyclomine (BENTYL) 10 MG capsule Take 1 capsule (10 mg total) by mouth 3 (three) times daily before meals.   methocarbamol (ROBAXIN) 500 MG tablet Take 1 tablet (500 mg total) by mouth 2 (two) times daily as needed for  muscle spasms. (Patient not taking: Reported on 05/02/2021)   omeprazole (PRILOSEC) 20 MG capsule Take 20 mg by mouth 2 (two) times daily.   tamsulosin (FLOMAX) 0.4 MG CAPS capsule Take 1 capsule (0.4 mg total) by mouth daily.   No facility-administered encounter medications on file as of 05/27/2021.   REVIEW OF SYSTEMS: Gen: Denies fever, sweats or chills. No weight loss.  CV: Denies chest pain, palpitations or edema. Resp: Denies cough, shortness of breath of hemoptysis.  GI: See HPI.  GU : Denies urinary burning, blood in urine, increased urinary frequency or incontinence. MS: + Back pain.  Derm: Denies rash, itchiness, skin lesions or unhealing ulcers. Psych: Denies depression, anxiety, memory loss or confusion. Heme: Denies bruising, bleeding. Neuro:  Denies headaches, dizziness or paresthesias. Endo:  Denies any problems with DM, thyroid or adrenal function.  PHYSICAL EXAM: BP 112/68    Pulse 80    Ht 5\' 7"  (1.702 m)    Wt 158 lb (71.7 kg)    SpO2 98%    BMI 24.75 kg/m   General: 81 year old male in no acute distress. Head: Normocephalic and atraumatic. Eyes:  Sclerae non-icteric, conjunctive pink. Ears: Normal auditory acuity. Mouth: Dentition intact. No ulcers or lesions.  Neck: Supple, no lymphadenopathy or thyromegaly.  Lungs: Clear bilaterally to auscultation without wheezes, crackles or rhonchi. Heart: Regular rate and rhythm. No murmur, rub or gallop appreciated.  Abdomen: Soft, non distended. Mild gaseous distension. Mild periumbilical and LLQ tenderness without rebound or guarding. No masses. No hepatosplenomegaly. Normoactive bowel sounds x 4 quadrants.  Rectal: Deferred.  Musculoskeletal: Symmetrical with no gross deformities. Skin: Warm and dry. No rash or lesions on visible extremities. Extremities: No edema. Neurological: Alert oriented x 4, no focal deficits.  Psychological:  Alert and cooperative. Normal mood and affect.  ASSESSMENT AND PLAN:  25)  81 year old male with a history of diverticulitis with LLQ pain x 3 to 4 months, unresponsive to course of Augmentin and Cipro/Flagyl. CTAP 03/21/2021 without evidence of diverticulitis. LLQ pain has not worsened since CTAP was done. No fevers or weight loss. Colonoscopy in 2014 showed diverticulosis, no polyps. -CBC, CMP and CRP -Colonoscopy benefits and risks discussed including risk with sedation, risk of bleeding, perforation and infection.  The patient wishes to proceed with a diagnostic colonoscopy as his LLQ pain persists and interferes with the quality of living.  -Repeat CTAP if abdominal pain worsens  2) GERD, stable on Omeprazole 20 mg daily  Further recommendations to be determined after the above evaluation completed        CC:  Maximiano Coss, NP

## 2021-05-27 ENCOUNTER — Other Ambulatory Visit (INDEPENDENT_AMBULATORY_CARE_PROVIDER_SITE_OTHER): Payer: Medicare Other

## 2021-05-27 ENCOUNTER — Encounter: Payer: Self-pay | Admitting: Nurse Practitioner

## 2021-05-27 ENCOUNTER — Ambulatory Visit: Payer: Medicare Other | Admitting: Nurse Practitioner

## 2021-05-27 VITALS — BP 112/68 | HR 80 | Ht 67.0 in | Wt 158.0 lb

## 2021-05-27 DIAGNOSIS — R1032 Left lower quadrant pain: Secondary | ICD-10-CM

## 2021-05-27 DIAGNOSIS — R197 Diarrhea, unspecified: Secondary | ICD-10-CM | POA: Diagnosis not present

## 2021-05-27 DIAGNOSIS — K5900 Constipation, unspecified: Secondary | ICD-10-CM

## 2021-05-27 LAB — COMPREHENSIVE METABOLIC PANEL
ALT: 19 U/L (ref 0–53)
AST: 19 U/L (ref 0–37)
Albumin: 4.1 g/dL (ref 3.5–5.2)
Alkaline Phosphatase: 82 U/L (ref 39–117)
BUN: 12 mg/dL (ref 6–23)
CO2: 27 mEq/L (ref 19–32)
Calcium: 9.5 mg/dL (ref 8.4–10.5)
Chloride: 105 mEq/L (ref 96–112)
Creatinine, Ser: 1 mg/dL (ref 0.40–1.50)
GFR: 71.95 mL/min (ref >60.00)
Glucose, Bld: 81 mg/dL (ref 70–99)
Potassium: 3.9 mEq/L (ref 3.5–5.1)
Sodium: 140 mEq/L (ref 135–145)
Total Bilirubin: 0.8 mg/dL (ref 0.2–1.2)
Total Protein: 7.2 g/dL (ref 6.0–8.3)

## 2021-05-27 LAB — CBC WITH DIFFERENTIAL/PLATELET
Basophils Absolute: 0 10*3/uL (ref 0.0–0.1)
Basophils Relative: 0.4 % (ref 0.0–3.0)
Eosinophils Absolute: 0.2 10*3/uL (ref 0.0–0.7)
Eosinophils Relative: 2.3 % (ref 0.0–5.0)
HCT: 41.4 % (ref 39.0–52.0)
Hemoglobin: 13.9 g/dL (ref 13.0–17.0)
Lymphocytes Relative: 20.8 % (ref 12.0–46.0)
Lymphs Abs: 1.4 10*3/uL (ref 0.7–4.0)
MCHC: 33.6 g/dL (ref 30.0–36.0)
MCV: 87.3 fl (ref 78.0–100.0)
Monocytes Absolute: 0.5 10*3/uL (ref 0.1–1.0)
Monocytes Relative: 8.2 % (ref 3.0–12.0)
Neutro Abs: 4.5 10*3/uL (ref 1.4–7.7)
Neutrophils Relative %: 68.3 % (ref 43.0–77.0)
Platelets: 218 10*3/uL (ref 150.0–400.0)
RBC: 4.74 Mil/uL (ref 4.22–5.81)
RDW: 13.7 % (ref 11.5–15.5)
WBC: 6.6 10*3/uL (ref 4.0–10.5)

## 2021-05-27 LAB — C-REACTIVE PROTEIN: CRP: <1 mg/dL (ref 0.5–20.0)

## 2021-05-27 MED ORDER — SUPREP BOWEL PREP KIT 17.5-3.13-1.6 GM/177ML PO SOLN: 1.0000 | ORAL | 0 refills | Status: DC

## 2021-05-27 NOTE — Patient Instructions (Signed)
PROCEDURES: You have been scheduled for a colonoscopy. Please follow the written instructions given to you at your visit today. Please pick up your prep supplies at the pharmacy within the next 1-3 days. If you use inhalers (even only as needed), please bring them with you on the day of your procedure.  LABS:  Lab work has been ordered for you today. Our lab is located in the basement. Press "B" on the elevator. The lab is located at the first door on the left as you exit the elevator.  RECOMMENDATIONS: Miralax- Dissolve one capful in 8 ounces of water and drink before bed. [facility] Bacteria Probiotic once a day. Please contact our office if abdominal pain worsen.  It was great seeing you today! Thank you for entrusting me with your care and choosing [facility].  Noralyn Pick, CRNP  The [facility] providers would like to encourage you to use [facility] to communicate with providers for non-urgent requests or questions.  Due to long hold times on the telephone, sending your provider a message by [facility] may be faster and more efficient way to get a response. Please allow 48 business hours for a response.  Please remember that this is for non-urgent requests/questions.  If you are age 56 or older, your body mass index should be between 23-30. Your Body mass index is 24.75 kg/m. If this is out of the aforementioned range listed, please consider follow up with your Primary Care Provider.  If you are age 27 or younger, your body mass index should be between 19-25. Your Body mass index is 24.75 kg/m. If this is out of the aformentioned range listed, please consider follow up with your Primary Care Provider.

## 2021-05-27 NOTE — Progress Notes (Signed)
Attending Physician's Attestation   I have reviewed the chart.   I agree with the Advanced Practitioner's note, impression, and recommendations with any updates as below. Persistent ongoing abdominal discomfort with negative imaging and no improvement with 2 rounds of antibiotics.  Reasonable to evaluate the area to ensure no other abnormality and so diagnostic colonoscopy is very reasonable.  We will plan to meet the patient in the coming weeks.  If pain becomes so severe that he cannot tolerate it, repeat imaging may be required.   Justice Britain, MD [facility] Advanced [facility]oscopy Office # [phone]

## 2021-06-11 ENCOUNTER — Other Ambulatory Visit: Payer: Self-pay | Admitting: Registered Nurse

## 2021-06-11 DIAGNOSIS — R350 Frequency of micturition: Secondary | ICD-10-CM

## 2021-06-12 ENCOUNTER — Ambulatory Visit (AMBULATORY_SURGERY_CENTER): Payer: Medicare Other | Admitting: Gastroenterology

## 2021-06-12 ENCOUNTER — Encounter: Payer: Self-pay | Admitting: Gastroenterology

## 2021-06-12 VITALS — BP 129/72 | HR 68 | Temp 98.0°F | Resp 13 | Ht 67.0 in | Wt 158.0 lb

## 2021-06-12 DIAGNOSIS — R1032 Left lower quadrant pain: Secondary | ICD-10-CM | POA: Diagnosis not present

## 2021-06-12 DIAGNOSIS — K641 Second degree hemorrhoids: Secondary | ICD-10-CM | POA: Diagnosis not present

## 2021-06-12 DIAGNOSIS — Z1211 Encounter for screening for malignant neoplasm of colon: Secondary | ICD-10-CM | POA: Diagnosis not present

## 2021-06-12 DIAGNOSIS — K5730 Diverticulosis of large intestine without perforation or abscess without bleeding: Secondary | ICD-10-CM | POA: Diagnosis not present

## 2021-06-12 MED ORDER — DICYCLOMINE HCL 10 MG PO CAPS: 10.0000 mg | ORAL_CAPSULE | Freq: Three times a day (TID) | ORAL | 1 refills | Status: DC

## 2021-06-12 MED ORDER — SODIUM CHLORIDE 0.9 % IV SOLN: 500.0000 mL | INTRAVENOUS | Status: AC

## 2021-06-12 NOTE — Patient Instructions (Signed)
Handouts on high fiber diet, hemorrhoids and diverticulosis given to you today  High fiber diet recommended   YOU HAD AN ENDOSCOPIC PROCEDURE TODAY AT [facility]:   Refer to the procedure report that was given to you for any specific questions about what was found during the examination.  If the procedure report does not answer your questions, please call your gastroenterologist to clarify.  If you requested that your care partner not be given the details of your procedure findings, then the procedure report has been included in a sealed envelope for you to review at your convenience later.  YOU SHOULD EXPECT: Some feelings of bloating in the abdomen. Passage of more gas than usual.  Walking can help get rid of the air that was put into your GI tract during the procedure and reduce the bloating. If you had a lower endoscopy (such as a colonoscopy or flexible sigmoidoscopy) you may notice spotting of blood in your stool or on the toilet paper. If you underwent a bowel prep for your procedure, you may not have a normal bowel movement for a few days.  Please Note:  You might notice some irritation and congestion in your nose or some drainage.  This is from the oxygen used during your procedure.  There is no need for concern and it should clear up in a day or so.  SYMPTOMS TO REPORT IMMEDIATELY:  Following lower endoscopy (colonoscopy or flexible sigmoidoscopy):  Excessive amounts of blood in the stool  Significant tenderness or worsening of abdominal pains  Swelling of the abdomen that is new, acute  Fever of 100F or higher   For urgent or emergent issues, a gastroenterologist can be reached at any hour by calling [phone]. Do not use MyChart messaging for urgent concerns.    DIET:  We do recommend a small meal at first, but then you may proceed to your regular diet.  Drink plenty of fluids but you should avoid alcoholic beverages for 24 hours.  ACTIVITY:  You should  plan to take it easy for the rest of today and you should NOT DRIVE or use heavy machinery until tomorrow (because of the sedation medicines used during the test).    FOLLOW UP: Our staff will call the number listed on your records 48-72 hours following your procedure to check on you and address any questions or concerns that you may have regarding the information given to you following your procedure. If we do not reach you, we will leave a message.  We will attempt to reach you two times.  During this call, we will ask if you have developed any symptoms of COVID 19. If you develop any symptoms (ie: fever, flu-like symptoms, shortness of breath, cough etc.) before then, please call [phone].  If you test positive for Covid 19 in the 2 weeks post procedure, please call and report this information to [place].    If any biopsies were taken you will be contacted by phone or by letter within the next 1-3 weeks.  Please call us at [phone] if you have not heard about the biopsies in 3 weeks.    SIGNATURES/CONFIDENTIALITY: You and/or your care partner have signed paperwork which will be entered into your electronic medical record.  These signatures attest to the fact that that the information above on your After Visit Summary has been reviewed and is understood.  Full responsibility of the confidentiality of this discharge information lies with you and/or your care-partner.

## 2021-06-12 NOTE — Progress Notes (Signed)
Pt's states no medical or surgical changes since previsit or office visit. 

## 2021-06-12 NOTE — Progress Notes (Signed)
GASTROENTEROLOGY PROCEDURE H&P NOTE   Primary Care Physician: Wendie Agreste, MD  HPI: Evan Lopez is a 81 y.o. male who presents for Colonoscopy for followup of LLQ pain after being treated for diverticulitis and still with discomfort.  Past Medical History:  Diagnosis Date   DIVERTICULOSIS OF COLON    GERD (gastroesophageal reflux disease)    Hyperlipidemia    Kidney calculi    Nonspecific abnormal finding in stool contents    Past Surgical History:  Procedure Laterality Date   [place]   Current Outpatient Medications  Medication Sig Dispense Refill   AMBULATORY NON FORMULARY MEDICATION Take 2 capsules by mouth daily. Medication Name: SYNOGUT     methocarbamol (ROBAXIN) 500 MG tablet Take 1 tablet (500 mg total) by mouth 2 (two) times daily as needed for muscle spasms. (Patient taking differently: Take 500 mg by mouth as needed for muscle spasms.) 30 tablet 1   omeprazole (PRILOSEC) 20 MG capsule Take 20 mg by mouth daily.     SUPREP BOWEL PREP KIT 17.5-3.13-1.6 GM/177ML SOLN Take 1 kit by mouth as directed. For colonoscopy prep 354 mL 0   tamsulosin (FLOMAX) 0.4 MG CAPS capsule TAKE 1 CAPSULE(0.4 MG) BY MOUTH DAILY 30 capsule 3   vitamin B-12 (CYANOCOBALAMIN) 1000 MCG tablet Take 1,000 mcg by mouth daily.     No current facility-administered medications for this visit.    Current Outpatient Medications:    AMBULATORY NON FORMULARY MEDICATION, Take 2 capsules by mouth daily. Medication Name: SYNOGUT, Disp: , Rfl:    methocarbamol (ROBAXIN) 500 MG tablet, Take 1 tablet (500 mg total) by mouth 2 (two) times daily as needed for muscle spasms. (Patient taking differently: Take 500 mg by mouth as needed for muscle spasms.), Disp: 30 tablet, Rfl: 1   omeprazole (PRILOSEC) 20 MG capsule, Take 20 mg by mouth daily., Disp: , Rfl:    SUPREP BOWEL PREP KIT 17.5-3.13-1.6 GM/177ML SOLN, Take 1 kit by mouth as directed. For colonoscopy prep,  Disp: 354 mL, Rfl: 0   tamsulosin (FLOMAX) 0.4 MG CAPS capsule, TAKE 1 CAPSULE(0.4 MG) BY MOUTH DAILY, Disp: 30 capsule, Rfl: 3   vitamin B-12 (CYANOCOBALAMIN) 1000 MCG tablet, Take 1,000 mcg by mouth daily., Disp: , Rfl:  No Known Allergies Family History  Problem Relation Age of Onset   Pancreatic cancer Mother    Alcohol abuse Father    Breast cancer Sister    Social History   Socioeconomic History   Marital status: Married    Spouse name: Not on file   Number of children: Not on file   Years of education: Not on file   Highest education level: Not on file  Occupational History   Not on file  Tobacco Use   Smoking status: Never   Smokeless tobacco: Never  Vaping Use   Vaping Use: Never used  Substance and Sexual Activity   Alcohol use: No    Alcohol/week: 0.0 standard drinks   Drug use: No   Sexual activity: Yes  Other Topics Concern   Not on file  Social History Narrative   Not on file   Social Determinants of Health   Financial Resource Strain: Low Risk    Difficulty of Paying Living Expenses: Not hard at all  Food Insecurity: No Food Insecurity   Worried About Charity fundraiser in the Last Year: Never true   [place] in the Last Year: Never  true  Transportation Needs: No Transportation Needs   Lack of Transportation (Medical): No   Lack of Transportation (Non-Medical): No  Physical Activity: Inactive   Days of Exercise per Week: 0 days   Minutes of Exercise per Session: 0 min  Stress: No Stress Concern Present   Feeling of Stress : Not at all  Social Connections: Moderately Isolated   Frequency of Communication with Friends and Family: More than three times a week   Frequency of Social Gatherings with Friends and Family: More than three times a week   Attends Religious Services: Never   Marine scientist or Organizations: No   Attends Music therapist: Never   Marital Status: Married  Human resources officer Violence: Not At Risk    Fear of Current or Ex-Partner: No   Emotionally Abused: No   Physically Abused: No   Sexually Abused: No    Physical Exam: There were no vitals filed for this visit. There is no height or weight on file to calculate BMI. GEN: NAD EYE: Sclerae anicteric ENT: MMM CV: Non-tachycardic GI: Soft, NT/ND NEURO:  Alert & Oriented x 3  Lab Results: No results for input(s): WBC, HGB, HCT, PLT in the last 72 hours. BMET No results for input(s): NA, K, CL, CO2, GLUCOSE, BUN, CREATININE, CALCIUM in the last 72 hours. LFT No results for input(s): PROT, ALBUMIN, AST, ALT, ALKPHOS, BILITOT, BILIDIR, IBILI in the last 72 hours. PT/INR No results for input(s): LABPROT, INR in the last 72 hours.   Impression / Plan: This is a 81 y.o.male who presents for Colonoscopy for followup of LLQ pain after being treated for diverticulitis and still with discomfort.  The risks and benefits of endoscopic evaluation/treatment were discussed with the patient and/or family; these include but are not limited to the risk of perforation, infection, bleeding, missed lesions, lack of diagnosis, severe illness requiring hospitalization, as well as anesthesia and sedation related illnesses.  The patient's history has been reviewed, patient examined, no change in status, and deemed stable for procedure.  The patient and/or family is agreeable to proceed.    Justice Britain, MD [facility] Advanced [facility]oscopy Office # [phone]

## 2021-06-12 NOTE — Op Note (Signed)
[facility] Patient Name: Evan Lopez Procedure Date: 06/12/2021 3:32 PM MRN: [id number] Endoscopist: Justice Britain , MD Age: 81 Referring MD:  Date of Birth: [birth year] Gender: Male Account #: [id number] Procedure:                Colonoscopy Indications:              Screening for colorectal malignant neoplasm,                            History of possible diverticulitis with persistent                            LLQ pain (improved currently but not completely                            gone) Medicines:                Monitored Anesthesia Care Procedure:                Pre-Anesthesia Assessment:                           - Prior to the procedure, a History and Physical                            was performed, and patient medications and                            allergies were reviewed. The patient's tolerance of                            previous anesthesia was also reviewed. The risks                            and benefits of the procedure and the sedation                            options and risks were discussed with the patient.                            All questions were answered, and informed consent                            was obtained. Prior Anticoagulants: The patient has                            taken no previous anticoagulant or antiplatelet                            agents. ASA Grade Assessment: III - A patient with                            severe systemic disease. After reviewing the risks  and benefits, the patient was deemed in                            satisfactory condition to undergo the procedure.                           After obtaining informed consent, the colonoscope                            was passed under direct vision. Throughout the                            procedure, the patient's blood pressure, pulse, and                            oxygen saturations were monitored continuously. The                             CF [id number] #[id number] was introduced through the anus                            and advanced to the 5 cm into the ileum. The                            colonoscopy was performed without difficulty. The                            patient tolerated the procedure. The quality of the                            bowel preparation was adequate. The terminal ileum,                            ileocecal valve, appendiceal orifice, and rectum                            were photographed. Scope In: 3:49:59 PM Scope Out: 4:00:30 PM Scope Withdrawal Time: 0 hours 6 minutes 9 seconds  Total Procedure Duration: 0 hours 10 minutes 31 seconds  Findings:                 The digital rectal exam findings include                            hemorrhoids. Pertinent negatives include no                            palpable rectal lesions.                           The terminal ileum and ileocecal valve appeared                            normal.  Many small and large-mouthed diverticula were found                            in the entire colon.                           Normal mucosa was found in the entire colon                            otherwise.                           Non-bleeding non-thrombosed external and internal                            hemorrhoids were found during retroflexion, during                            perianal exam and during digital exam. The                            hemorrhoids were Grade II (internal hemorrhoids                            that prolapse but reduce spontaneously). Complications:            No immediate complications. Estimated Blood Loss:     Estimated blood loss was minimal. Impression:               - Hemorrhoids found on digital rectal exam.                           - The examined portion of the ileum was normal.                           - Diverticulosis in the entire examined colon.                           -  Normal mucosa in the entire examined colon                            otherwise.                           - Non-bleeding non-thrombosed external and internal                            hemorrhoids. Recommendation:           - The patient will be observed post-procedure,                            until all discharge criteria are met.                           - Discharge patient to home.                           -  Patient has a contact number available for                            emergencies. The signs and symptoms of potential                            delayed complications were discussed with the                            patient. Return to normal activities tomorrow.                            Written discharge instructions were provided to the                            patient.                           - High fiber diet.                           - Use FiberCon 1-2 tablets PO daily.                           - Continue present medications.                           - Trial Bentyl 10 mg 2-3 times per day as needed                            for pain/cramping (30 tablets; 1 refill).                           - Repeat colonoscopy is not recommended due to                            current age (25 years or older). Justice Britain, MD 06/12/2021 4:05:38 PM

## 2021-06-12 NOTE — Progress Notes (Signed)
Pt non-responsive, VVS, Report to RN  °

## 2021-06-16 ENCOUNTER — Telehealth: Payer: Self-pay | Admitting: *Deleted

## 2021-06-16 DIAGNOSIS — M545 Low back pain, unspecified: Secondary | ICD-10-CM | POA: Diagnosis not present

## 2021-06-16 DIAGNOSIS — M5416 Radiculopathy, lumbar region: Secondary | ICD-10-CM | POA: Diagnosis not present

## 2021-06-16 NOTE — Telephone Encounter (Signed)
°  Follow up Call-  Call back number 06/12/2021  Post procedure Call Back phone  # [phone]  Permission to leave phone message Yes  Some recent data might be hidden     Patient questions:  Do you have a fever, pain , or abdominal swelling? No. Pain Score  0 *  Have you tolerated food without any problems? Yes.    Have you been able to return to your normal activities? Yes.    Do you have any questions about your discharge instructions: Diet   No. Medications  No. Follow up visit  No.  Do you have questions or concerns about your Care? No.  Actions: * If pain score is 4 or above: No action needed, pain <4.

## 2021-06-17 ENCOUNTER — Other Ambulatory Visit: Payer: Self-pay | Admitting: Orthopedic Surgery

## 2021-06-17 DIAGNOSIS — M545 Low back pain, unspecified: Secondary | ICD-10-CM

## 2021-06-18 DIAGNOSIS — H2512 Age-related nuclear cataract, left eye: Secondary | ICD-10-CM | POA: Diagnosis not present

## 2021-06-18 DIAGNOSIS — H2513 Age-related nuclear cataract, bilateral: Secondary | ICD-10-CM | POA: Diagnosis not present

## 2021-06-19 DIAGNOSIS — H2511 Age-related nuclear cataract, right eye: Secondary | ICD-10-CM | POA: Diagnosis not present

## 2021-06-20 ENCOUNTER — Other Ambulatory Visit: Payer: Self-pay

## 2021-06-20 ENCOUNTER — Ambulatory Visit
Admission: RE | Admit: 2021-06-20 | Discharge: 2021-06-20 | Disposition: A | Discharge status: Home / Self Care | Payer: Medicare Other | Source: Ambulatory Visit | Attending: Orthopedic Surgery | Admitting: Orthopedic Surgery

## 2021-06-20 DIAGNOSIS — M5136 Other intervertebral disc degeneration, lumbar region: Secondary | ICD-10-CM | POA: Diagnosis not present

## 2021-06-20 DIAGNOSIS — M545 Low back pain, unspecified: Secondary | ICD-10-CM | POA: Diagnosis not present

## 2021-06-20 DIAGNOSIS — R29898 Other symptoms and signs involving the musculoskeletal system: Secondary | ICD-10-CM | POA: Diagnosis not present

## 2021-06-20 DIAGNOSIS — M48061 Spinal stenosis, lumbar region without neurogenic claudication: Secondary | ICD-10-CM | POA: Diagnosis not present

## 2021-06-30 DIAGNOSIS — M5416 Radiculopathy, lumbar region: Secondary | ICD-10-CM | POA: Diagnosis not present

## 2021-07-02 DIAGNOSIS — H2513 Age-related nuclear cataract, bilateral: Secondary | ICD-10-CM | POA: Diagnosis not present

## 2021-07-02 DIAGNOSIS — H2511 Age-related nuclear cataract, right eye: Secondary | ICD-10-CM | POA: Diagnosis not present

## 2021-07-09 DIAGNOSIS — M545 Low back pain, unspecified: Secondary | ICD-10-CM | POA: Diagnosis not present

## 2021-07-09 DIAGNOSIS — M25552 Pain in left hip: Secondary | ICD-10-CM | POA: Diagnosis not present

## 2021-07-09 DIAGNOSIS — M6281 Muscle weakness (generalized): Secondary | ICD-10-CM | POA: Diagnosis not present

## 2021-07-15 DIAGNOSIS — M545 Low back pain, unspecified: Secondary | ICD-10-CM | POA: Diagnosis not present

## 2021-07-15 DIAGNOSIS — M6281 Muscle weakness (generalized): Secondary | ICD-10-CM | POA: Diagnosis not present

## 2021-07-15 DIAGNOSIS — M25552 Pain in left hip: Secondary | ICD-10-CM | POA: Diagnosis not present

## 2021-07-22 DIAGNOSIS — M6281 Muscle weakness (generalized): Secondary | ICD-10-CM | POA: Diagnosis not present

## 2021-07-22 DIAGNOSIS — M25552 Pain in left hip: Secondary | ICD-10-CM | POA: Diagnosis not present

## 2021-07-22 DIAGNOSIS — M545 Low back pain, unspecified: Secondary | ICD-10-CM | POA: Diagnosis not present

## 2021-07-24 DIAGNOSIS — M6281 Muscle weakness (generalized): Secondary | ICD-10-CM | POA: Diagnosis not present

## 2021-07-24 DIAGNOSIS — M25552 Pain in left hip: Secondary | ICD-10-CM | POA: Diagnosis not present

## 2021-07-24 DIAGNOSIS — M545 Low back pain, unspecified: Secondary | ICD-10-CM | POA: Diagnosis not present

## 2021-07-29 DIAGNOSIS — M25552 Pain in left hip: Secondary | ICD-10-CM | POA: Diagnosis not present

## 2021-07-29 DIAGNOSIS — M545 Low back pain, unspecified: Secondary | ICD-10-CM | POA: Diagnosis not present

## 2021-07-29 DIAGNOSIS — M6281 Muscle weakness (generalized): Secondary | ICD-10-CM | POA: Diagnosis not present

## 2021-07-31 DIAGNOSIS — M25552 Pain in left hip: Secondary | ICD-10-CM | POA: Diagnosis not present

## 2021-07-31 DIAGNOSIS — M6281 Muscle weakness (generalized): Secondary | ICD-10-CM | POA: Diagnosis not present

## 2021-07-31 DIAGNOSIS — M545 Low back pain, unspecified: Secondary | ICD-10-CM | POA: Diagnosis not present

## 2021-08-06 DIAGNOSIS — M6281 Muscle weakness (generalized): Secondary | ICD-10-CM | POA: Diagnosis not present

## 2021-08-06 DIAGNOSIS — M545 Low back pain, unspecified: Secondary | ICD-10-CM | POA: Diagnosis not present

## 2021-08-06 DIAGNOSIS — M25552 Pain in left hip: Secondary | ICD-10-CM | POA: Diagnosis not present

## 2021-08-13 DIAGNOSIS — M25552 Pain in left hip: Secondary | ICD-10-CM | POA: Diagnosis not present

## 2021-08-13 DIAGNOSIS — M545 Low back pain, unspecified: Secondary | ICD-10-CM | POA: Diagnosis not present

## 2021-08-13 DIAGNOSIS — M6281 Muscle weakness (generalized): Secondary | ICD-10-CM | POA: Diagnosis not present

## 2021-08-21 DIAGNOSIS — M25552 Pain in left hip: Secondary | ICD-10-CM | POA: Diagnosis not present

## 2021-08-21 DIAGNOSIS — M6281 Muscle weakness (generalized): Secondary | ICD-10-CM | POA: Diagnosis not present

## 2021-08-21 DIAGNOSIS — M545 Low back pain, unspecified: Secondary | ICD-10-CM | POA: Diagnosis not present

## 2021-08-26 DIAGNOSIS — M25552 Pain in left hip: Secondary | ICD-10-CM | POA: Diagnosis not present

## 2021-08-26 DIAGNOSIS — M6281 Muscle weakness (generalized): Secondary | ICD-10-CM | POA: Diagnosis not present

## 2021-08-26 DIAGNOSIS — M545 Low back pain, unspecified: Secondary | ICD-10-CM | POA: Diagnosis not present

## 2021-09-01 DIAGNOSIS — M6281 Muscle weakness (generalized): Secondary | ICD-10-CM | POA: Diagnosis not present

## 2021-09-01 DIAGNOSIS — M545 Low back pain, unspecified: Secondary | ICD-10-CM | POA: Diagnosis not present

## 2021-09-01 DIAGNOSIS — M25552 Pain in left hip: Secondary | ICD-10-CM | POA: Diagnosis not present

## 2021-09-08 DIAGNOSIS — M6281 Muscle weakness (generalized): Secondary | ICD-10-CM | POA: Diagnosis not present

## 2021-09-08 DIAGNOSIS — M545 Low back pain, unspecified: Secondary | ICD-10-CM | POA: Diagnosis not present

## 2021-09-08 DIAGNOSIS — M25552 Pain in left hip: Secondary | ICD-10-CM | POA: Diagnosis not present

## 2021-09-10 DIAGNOSIS — M545 Low back pain, unspecified: Secondary | ICD-10-CM | POA: Diagnosis not present

## 2021-09-10 DIAGNOSIS — M6281 Muscle weakness (generalized): Secondary | ICD-10-CM | POA: Diagnosis not present

## 2021-09-10 DIAGNOSIS — M25552 Pain in left hip: Secondary | ICD-10-CM | POA: Diagnosis not present

## 2021-09-15 DIAGNOSIS — M6281 Muscle weakness (generalized): Secondary | ICD-10-CM | POA: Diagnosis not present

## 2021-09-15 DIAGNOSIS — M545 Low back pain, unspecified: Secondary | ICD-10-CM | POA: Diagnosis not present

## 2021-09-15 DIAGNOSIS — M25552 Pain in left hip: Secondary | ICD-10-CM | POA: Diagnosis not present

## 2021-09-22 DIAGNOSIS — M545 Low back pain, unspecified: Secondary | ICD-10-CM | POA: Diagnosis not present

## 2021-09-22 DIAGNOSIS — M6281 Muscle weakness (generalized): Secondary | ICD-10-CM | POA: Diagnosis not present

## 2021-09-22 DIAGNOSIS — M25552 Pain in left hip: Secondary | ICD-10-CM | POA: Diagnosis not present

## 2021-09-24 DIAGNOSIS — M545 Low back pain, unspecified: Secondary | ICD-10-CM | POA: Diagnosis not present

## 2021-09-24 DIAGNOSIS — M25552 Pain in left hip: Secondary | ICD-10-CM | POA: Diagnosis not present

## 2021-09-24 DIAGNOSIS — M6281 Muscle weakness (generalized): Secondary | ICD-10-CM | POA: Diagnosis not present

## 2021-09-29 DIAGNOSIS — M545 Low back pain, unspecified: Secondary | ICD-10-CM | POA: Diagnosis not present

## 2021-09-29 DIAGNOSIS — M6281 Muscle weakness (generalized): Secondary | ICD-10-CM | POA: Diagnosis not present

## 2021-09-29 DIAGNOSIS — M25552 Pain in left hip: Secondary | ICD-10-CM | POA: Diagnosis not present

## 2021-10-01 DIAGNOSIS — M25552 Pain in left hip: Secondary | ICD-10-CM | POA: Diagnosis not present

## 2021-10-01 DIAGNOSIS — M545 Low back pain, unspecified: Secondary | ICD-10-CM | POA: Diagnosis not present

## 2021-10-01 DIAGNOSIS — M6281 Muscle weakness (generalized): Secondary | ICD-10-CM | POA: Diagnosis not present

## 2021-10-06 DIAGNOSIS — M25552 Pain in left hip: Secondary | ICD-10-CM | POA: Diagnosis not present

## 2021-10-06 DIAGNOSIS — M6281 Muscle weakness (generalized): Secondary | ICD-10-CM | POA: Diagnosis not present

## 2021-10-06 DIAGNOSIS — M545 Low back pain, unspecified: Secondary | ICD-10-CM | POA: Diagnosis not present

## 2021-10-08 DIAGNOSIS — M25552 Pain in left hip: Secondary | ICD-10-CM | POA: Diagnosis not present

## 2021-10-08 DIAGNOSIS — M545 Low back pain, unspecified: Secondary | ICD-10-CM | POA: Diagnosis not present

## 2021-10-08 DIAGNOSIS — M6281 Muscle weakness (generalized): Secondary | ICD-10-CM | POA: Diagnosis not present

## 2021-10-27 DIAGNOSIS — L821 Other seborrheic keratosis: Secondary | ICD-10-CM | POA: Diagnosis not present

## 2021-10-27 DIAGNOSIS — L570 Actinic keratosis: Secondary | ICD-10-CM | POA: Diagnosis not present

## 2021-10-28 DIAGNOSIS — M4696 Unspecified inflammatory spondylopathy, lumbar region: Secondary | ICD-10-CM | POA: Diagnosis not present

## 2021-10-28 DIAGNOSIS — M5451 Vertebrogenic low back pain: Secondary | ICD-10-CM | POA: Diagnosis not present

## 2021-10-28 DIAGNOSIS — M5416 Radiculopathy, lumbar region: Secondary | ICD-10-CM | POA: Diagnosis not present

## 2021-11-17 ENCOUNTER — Other Ambulatory Visit: Payer: Self-pay

## 2021-11-17 ENCOUNTER — Ambulatory Visit: Payer: Medicare Other | Admitting: Family Medicine

## 2021-11-17 DIAGNOSIS — R350 Frequency of micturition: Secondary | ICD-10-CM

## 2021-11-17 MED ORDER — TAMSULOSIN HCL 0.4 MG PO CAPS
ORAL_CAPSULE | ORAL | 3 refills | Status: DC
Start: 2021-11-17 — End: 2022-02-16

## 2021-11-20 ENCOUNTER — Ambulatory Visit (INDEPENDENT_AMBULATORY_CARE_PROVIDER_SITE_OTHER): Payer: Medicare Other | Admitting: Registered Nurse

## 2021-11-20 ENCOUNTER — Encounter: Payer: Self-pay | Admitting: Registered Nurse

## 2021-11-20 VITALS — BP 112/68 | HR 69 | Temp 98.4°F | Resp 19 | Ht 67.0 in | Wt 158.0 lb

## 2021-11-20 DIAGNOSIS — R1032 Left lower quadrant pain: Secondary | ICD-10-CM | POA: Diagnosis not present

## 2021-11-20 MED ORDER — AMOXICILLIN-POT CLAVULANATE 875-125 MG PO TABS
1.0000 | ORAL_TABLET | Freq: Two times a day (BID) | ORAL | 0 refills | Status: DC
Start: 2021-11-20 — End: 2024-02-03

## 2021-11-20 MED ORDER — METRONIDAZOLE 500 MG PO TABS: 500.0000 mg | ORAL_TABLET | Freq: Three times a day (TID) | ORAL | 0 refills | Status: DC

## 2021-11-20 NOTE — Progress Notes (Signed)
Acute Office Visit  Subjective:    Patient ID: Evan Lopez, male    DOB: 1941/02/04, 81 y.o.   MRN: [id number]  Chief Complaint  Patient presents with   Diverticulitis    Patient states he has been having a flare up with Diverticulitis patient states he has been dealing with it since 2011 But this past month with the diarrhea has been getting worse.    HPI Patient is in today for LLQ pain  Similar to past diverticulitis flares with crampy and sharp LLQ pains and diarrhea.  Diarrhea is worsening after this current flare started 2-3 weeks ago.  No blood or mucus in stool. No fevers, chills, fatigue sweats.  No nausea or vomiting. No radiation of pain No weight changes.  Colonoscopy on 06/12/21 showed "many large and small mouthed diverticuli" but no other concerns.   Outpatient Medications Prior to Visit  Medication Sig Dispense Refill   methocarbamol (ROBAXIN) 500 MG tablet Take 1 tablet (500 mg total) by mouth 2 (two) times daily as needed for muscle spasms. (Patient taking differently: Take 500 mg by mouth as needed for muscle spasms.) 30 tablet 1   omeprazole (PRILOSEC) 20 MG capsule Take 20 mg by mouth daily.     vitamin B-12 (CYANOCOBALAMIN) 1000 MCG tablet Take 1,000 mcg by mouth daily.     AMBULATORY NON FORMULARY MEDICATION Take 2 capsules by mouth daily. Medication Name: SYNOGUT (Patient not taking: Reported on 11/20/2021)     dicyclomine (BENTYL) 10 MG capsule Take 1 capsule (10 mg total) by mouth 3 (three) times daily. (Patient not taking: Reported on 11/20/2021) 30 capsule 1   SUPREP BOWEL PREP KIT 17.5-3.13-1.6 GM/177ML SOLN Take 1 kit by mouth as directed. For colonoscopy prep (Patient not taking: Reported on 11/20/2021) 354 mL 0   tamsulosin (FLOMAX) 0.4 MG CAPS capsule TAKE 1 CAPSULE(0.4 MG) BY MOUTH DAILY (Patient not taking: Reported on 11/20/2021) 30 capsule 3   Facility-Administered Medications Prior to Visit  Medication Dose Route Frequency Provider Last  Rate Last Admin   0.9 %  sodium chloride infusion  500 mL Intravenous Continuous Mansouraty, Telford Nab., MD        Review of Systems Per hpi      Objective:    BP 112/68   Pulse 69   Temp 98.4 F (36.9 C) (Temporal)   Resp 19   Ht 5' 7" (1.702 m)   Wt 158 lb (71.7 kg)   SpO2 98%   BMI 24.75 kg/m  Physical Exam Constitutional:      General: He is not in acute distress.    Appearance: Normal appearance. He is normal weight. He is not ill-appearing, toxic-appearing or diaphoretic.  Cardiovascular:     Rate and Rhythm: Normal rate and regular rhythm.     Heart sounds: Normal heart sounds. No murmur heard.    No friction rub. No gallop.  Pulmonary:     Effort: Pulmonary effort is normal. No respiratory distress.     Breath sounds: Normal breath sounds. No stridor. No wheezing, rhonchi or rales.  Chest:     Chest wall: No tenderness.  Abdominal:     General: Abdomen is flat. Bowel sounds are normal. There is no distension.     Palpations: Abdomen is soft. There is no mass.     Tenderness: There is abdominal tenderness (LLQ). There is no right CVA tenderness, left CVA tenderness, guarding or rebound.     Hernia: No hernia is present.  Neurological:  General: No focal deficit present.     Mental Status: He is alert and oriented to person, place, and time. Mental status is at baseline.  Psychiatric:        Mood and Affect: Mood normal.        Behavior: Behavior normal.        Thought Content: Thought content normal.        Judgment: Judgment normal.     No results found for any visits on 11/20/21.      Assessment & Plan:  1. LLQ pain - amoxicillin-clavulanate (AUGMENTIN) 875-125 MG tablet; Take 1 tablet by mouth 2 (two) times daily.  Dispense: 20 tablet; Refill: 0 - metroNIDAZOLE (FLAGYL) 500 MG tablet; Take 1 tablet (500 mg total) by mouth 3 (three) times daily.  Dispense: 30 tablet; Refill: 0    Meds ordered this encounter  Medications    amoxicillin-clavulanate (AUGMENTIN) 875-125 MG tablet    Sig: Take 1 tablet by mouth 2 (two) times daily.    Dispense:  20 tablet    Refill:  0    Order Specific Question:   Supervising Provider    Answer:   GREENE, JEFFREY R [[id number]]   metroNIDAZOLE (FLAGYL) 500 MG tablet    Sig: Take 1 tablet (500 mg total) by mouth 3 (three) times daily.    Dispense:  30 tablet    Refill:  0    Order Specific Question:   Supervising Provider    Answer:   GREENE, JEFFREY R [[id number]]    Return if symptoms worsen or fail to improve.  PLAN Will treat with abx as he has had good relief with this in the past.  Close monitoring for improvement. If none, consider having him follow up with GI. Advised mostly liquid diet.  Patient encouraged to call clinic with any questions, comments, or concerns.   Richard Morrow, NP 

## 2021-11-20 NOTE — Patient Instructions (Addendum)
Mr Bee -   Doristine Devoid to see you  Call with concerns  Take antibiotics until you run out  Thank you,  Rich     If you have lab work done today you will be contacted with your lab results within the next 2 weeks.  If you have not heard from [place] then please contact us. The fastest way to get your results is to register for My Chart.   IF you received an x-ray today, you will receive an invoice from [facility]. Please contact [facility] at [phone] with questions or concerns regarding your invoice.   IF you received labwork today, you will receive an invoice from [facility]. Please contact [facility] at [phone] with questions or concerns regarding your invoice.   Our billing staff will not be able to assist you with questions regarding bills from these companies.  You will be contacted with the lab results as soon as they are available. The fastest way to get your results is to activate your My Chart account. Instructions are located on the last page of this paperwork. If you have not heard from [place] regarding the results in 2 weeks, please contact this office.

## 2021-12-02 DIAGNOSIS — L570 Actinic keratosis: Secondary | ICD-10-CM | POA: Diagnosis not present

## 2021-12-02 DIAGNOSIS — M67441 Ganglion, right hand: Secondary | ICD-10-CM | POA: Diagnosis not present

## 2021-12-02 DIAGNOSIS — L814 Other melanin hyperpigmentation: Secondary | ICD-10-CM | POA: Diagnosis not present

## 2021-12-02 DIAGNOSIS — D492 Neoplasm of unspecified behavior of bone, soft tissue, and skin: Secondary | ICD-10-CM | POA: Diagnosis not present

## 2021-12-09 DIAGNOSIS — M67431 Ganglion, right wrist: Secondary | ICD-10-CM | POA: Diagnosis not present

## 2022-02-16 ENCOUNTER — Other Ambulatory Visit: Payer: Self-pay | Admitting: Family Medicine

## 2022-02-16 DIAGNOSIS — R350 Frequency of micturition: Secondary | ICD-10-CM

## 2022-02-27 ENCOUNTER — Encounter: Payer: Self-pay | Admitting: Family Medicine

## 2022-02-27 ENCOUNTER — Ambulatory Visit (INDEPENDENT_AMBULATORY_CARE_PROVIDER_SITE_OTHER): Payer: Medicare Other | Admitting: Family Medicine

## 2022-02-27 VITALS — BP 120/70 | HR 67 | Temp 98.0°F | Ht 67.0 in | Wt 159.6 lb

## 2022-02-27 DIAGNOSIS — Z8719 Personal history of other diseases of the digestive system: Secondary | ICD-10-CM

## 2022-02-27 DIAGNOSIS — K5900 Constipation, unspecified: Secondary | ICD-10-CM

## 2022-02-27 DIAGNOSIS — R1032 Left lower quadrant pain: Secondary | ICD-10-CM

## 2022-02-27 NOTE — Progress Notes (Signed)
Subjective:  Patient ID: Evan Lopez, male    DOB: Jul 27, 1940  Age: 81 y.o. MRN: [id number]  CC:  Chief Complaint  Patient presents with   Diverticulitis    Pt states his diverticulitis is bothering him again, pt states it has been bothering him for 4 or 5 months     HPI Evan Lopez presents for   Lower abdominal pain History of diverticulosis, diverticulitis, recurrent LLQ abd pain. last treated with augmentin and metronidazole by Kathrin Ruddy on 10/2021. Mild symptoms at that time.  Initial fluid diet, bland diet, then Augmentin if not improving.  Colonic diverticulosis without active diverticulitis on 03/23/2021 CT. Saw GI in January. Had colonoscopy 1/19: Hemorrhoids found on digital rectal exam. - The examined portion of the ileum was normal. - Diverticulosis in the entire examined colon. - Normal mucosa in the entire examined colon otherwise. - Non-bleeding non-thrombosed external and internal hemorrhoids. Fiber con and bentyl recommended if cramping - not taking.   Has been having cycles of diarrhea, then normal for a few days, then constipated at times for a few days. Some discomfort on left lower abdomen. Tender on left lower abdomen for a long time. No recent changes.  No recent fever, n/v.  Mucus in stool possibly at times. No blood in stool other than hemorrhoids at times.  No change in caliber in stools with diarrhea.  Constipated this am. Small bm this am.  No dysuria. On meds for prostate, no new urinary symptoms.  Tx: peptobismol. No fiber supplements recently - prior use helped with regularity.   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 Authorizing Provider  AMBULATORY NON FORMULARY MEDICATION Take 2 capsules by mouth daily. Medication Name: SYNOGUT   Yes [provider]  meloxicam (MOBIC) 15 MG tablet Take 15 mg by mouth daily.   Yes [provider]  omeprazole (PRILOSEC) 20 MG capsule Take 20 mg by mouth daily.   Yes [provider]  tamsulosin (FLOMAX) 0.4 MG CAPS capsule TAKE 1 CAPSULE(0.4 MG) BY MOUTH DAILY 02/16/22  Yes Wendie Agreste, MD  vitamin B-12 (CYANOCOBALAMIN) 1000 MCG tablet Take 1,000 mcg by mouth daily.   Yes [provider]  amoxicillin-clavulanate (AUGMENTIN) 875-125 MG tablet Take 1 tablet by mouth 2 (two) times daily. Patient not taking: Reported on 02/27/2022 11/20/21   Maximiano Coss, NP  dicyclomine (BENTYL) 10 MG capsule Take 1 capsule (10 mg total) by mouth 3 (three) times daily. Patient not taking: Reported on 11/20/2021 06/12/21   Mansouraty, Telford Nab., MD  methocarbamol (ROBAXIN) 500 MG tablet Take 1 tablet (500 mg total) by mouth 2 (two) times daily as needed for muscle spasms. Patient not taking: Reported on 02/27/2022 03/28/21  Maximiano Coss, NP  metroNIDAZOLE (FLAGYL) 500 MG tablet Take 1 tablet (500 mg total) by mouth 3 (three) times daily. Patient not taking: Reported on 02/27/2022 11/20/21   Maximiano Coss, NP  SUPREP BOWEL PREP KIT 17.5-3.13-1.6 GM/177ML SOLN Take 1 kit by mouth as directed. For colonoscopy prep Patient not taking: Reported on 11/20/2021 05/27/21   Noralyn Pick, NP   Social History   Socioeconomic History   Marital  status: Married    Spouse name: Not on file   Number of children: Not on file   Years of education: Not on file   Highest education level: Not on file  Occupational History   Not on file  Tobacco Use   Smoking status: Never   Smokeless tobacco: Never  Vaping Use   Vaping Use: Never used  Substance and Sexual Activity   Alcohol use: No    Alcohol/week: 0.0 standard drinks of alcohol   Drug use: No   Sexual activity: Yes  Other Topics Concern   Not on file  Social History Narrative   Not on file   Social Determinants of Health   Financial Resource Strain: Low Risk  (03/17/2021)   Overall Financial Resource Strain (CARDIA)    Difficulty of Paying Living Expenses: Not hard at all  Food Insecurity: No Food Insecurity (03/17/2021)   Hunger Vital Sign    Worried About Running Out of Food in the Last Year: Never true    [place] in the Last Year: Never true  Transportation Needs: No Transportation Needs (03/17/2021)   PRAPARE - Hydrologist (Medical): No    Lack of Transportation (Non-Medical): No  Physical Activity: Inactive (03/17/2021)   Exercise Vital Sign    Days of Exercise per Week: 0 days    Minutes of Exercise per Session: 0 min  Stress: No Stress Concern Present (03/17/2021)   Jorgeluis Gurganus    Feeling of Stress : Not at all  Social Connections: Moderately Isolated (03/17/2021)   Social Connection and Isolation Panel [NHANES]    Frequency of Communication with Friends and Family: More than three times a week    Frequency of Social Gatherings with Friends and Family: More than three times a week    Attends Religious Services: Never    Marine scientist or Organizations: No    Attends Archivist Meetings: Never    Marital Status: Married  Human resources officer Violence: Not At Risk (03/17/2021)   Humiliation, Afraid, Rape, and Kick questionnaire    Fear of  Current or Ex-Partner: No    Emotionally Abused: No    Physically Abused: No    Sexually Abused: No    Review of Systems   Objective:   Vitals:   02/27/22 0824  BP: 120/70  Pulse: 67  Temp: 98 F (36.7 C)  SpO2: 96%  Weight: 159 lb 9.6 oz (72.4 kg)  Height: '5\' 7"'  (1.702 m)     Physical Exam Vitals reviewed.  Constitutional:      Appearance: He is well-developed.  HENT:     Head: Normocephalic and atraumatic.  Neck:     Vascular: No carotid bruit or JVD.  Cardiovascular:     Rate and Rhythm: Normal rate and regular rhythm.     Heart sounds: Normal heart sounds. No murmur heard. Pulmonary:     Effort: Pulmonary effort is normal.     Breath sounds: Normal breath  sounds. No rales.  Abdominal:     General: There is no distension.     Tenderness: There is abdominal tenderness (Minimal discomfort left lower quadrant with deep palpation but not guarding, no rebound.). There is no right CVA tenderness, left CVA tenderness, guarding or rebound.  Musculoskeletal:     Right lower leg: No edema.     Left lower leg: No edema.  Skin:    General: Skin is warm and dry.  Neurological:     Mental Status: He is alert and oriented to person, place, and time.  Psychiatric:        Mood and Affect: Mood normal.        Assessment & Plan:  HARM JOU is a 81 y.o. male . LLQ pain  Constipation, unspecified constipation type  History of diverticulitis Chronic left lower quadrant discomfort.  Alternating constipation, loose stools as above.  Some discomfort with straining or constipation.  History of diverticulitis, but not seen on last colonoscopy or on prior imaging in October 2022.  Based on current history and exam less likely acute diverticulitis, possible discomfort from constipation, and loose stool may be leak around effects from constipation.  -Restart fiber supplement daily, can try MiraLAX once a day if constipated.  Bland diet, fluids in case this is early  diverticulitis, but if not improving in the next 3 to 4 days, advised to call and I will schedule CT for imaging and evaluation for possible diverticulitis.  RTC/ER precautions if worse.  No orders of the defined types were placed in this encounter.  Patient Instructions  Try miralax once today. Start back on fiber, fluids and bland diet this weekend. If bowels are moving more normally, I think the discomfort will improve. If not improving in next 3-4 days, call and I will order a CT scan. Less likely at this time. Return to the clinic or go to the nearest emergency room if any of your symptoms worsen or new symptoms occur.  Abdominal Pain, Adult Pain in the abdomen (abdominal pain) can be caused by many things. Often, abdominal pain is not serious and it gets better with no treatment or by being treated at home. However, sometimes abdominal pain is serious. Your health care provider will ask questions about your medical history and do a physical exam to try to determine the cause of your abdominal pain. Follow these instructions at home: Medicines Take over-the-counter and prescription medicines only as told by your health care provider. Do not take a laxative unless told by your health care provider. General instructions  Watch your condition for any changes. Drink enough fluid to keep your urine pale yellow. Keep all follow-up visits as told by your health care provider. This is important. Contact a health care provider if: Your abdominal pain changes or gets worse. You are not hungry or you lose weight without trying. You are constipated or have diarrhea for more than 2-3 days. You have pain when you urinate or have a bowel movement. Your abdominal pain wakes you up at night. Your pain gets worse with meals, after eating, or with certain foods. You are vomiting and cannot keep anything down. You have a fever. You have blood in your urine. Get help right away if: Your pain does not  go away as soon as your health care provider told you to expect. You cannot stop vomiting. Your pain is only in areas of the abdomen, such as the right side or the left lower portion  of the abdomen. Pain on the right side could be caused by appendicitis. You have bloody or black stools, or stools that look like tar. You have severe pain, cramping, or bloating in your abdomen. You have signs of dehydration, such as: Dark urine, very little urine, or no urine. Cracked lips. Dry mouth. Sunken eyes. Sleepiness. Weakness. You have trouble breathing or chest pain. Summary Often, abdominal pain is not serious and it gets better with no treatment or by being treated at home. However, sometimes abdominal pain is serious. Watch your condition for any changes. Take over-the-counter and prescription medicines only as told by your health care provider. Contact a health care provider if your abdominal pain changes or gets worse. Get help right away if you have severe pain, cramping, or bloating in your abdomen. This information is not intended to replace advice given to you by your health care provider. Make sure you discuss any questions you have with your health care provider. Document Revised: 06/30/2019 Document Reviewed: 09/19/2018 Elsevier Patient Education  [place].   Constipation, Adult Constipation is when a person has fewer than three bowel movements in a week, has difficulty having a bowel movement, or has stools (feces) that are dry, hard, or larger than normal. Constipation may be caused by an underlying condition. It may become worse with age if a person takes certain medicines and does not take in enough fluids. Follow these instructions at home: Eating and drinking  Eat foods that have a lot of fiber, such as beans, whole grains, and fresh fruits and vegetables. Limit foods that are low in fiber and high in fat and processed sugars, such as fried or sweet foods. These  include french fries, hamburgers, cookies, candies, and soda. Drink enough fluid to keep your urine pale yellow. General instructions Exercise regularly or as told by your health care provider. Try to do 150 minutes of moderate exercise each week. Use the bathroom when you have the urge to go. Do not hold it in. Take over-the-counter and prescription medicines only as told by your health care provider. This includes any fiber supplements. During bowel movements: Practice deep breathing while relaxing the lower abdomen. Practice pelvic floor relaxation. Watch your condition for any changes. Let your health care provider know about them. Keep all follow-up visits as told by your health care provider. This is important. Contact a health care provider if: You have pain that gets worse. You have a fever. You do not have a bowel movement after 4 days. You vomit. You are not hungry or you lose weight. You are bleeding from the opening between the buttocks (anus). You have thin, pencil-like stools. Get help right away if: You have a fever and your symptoms suddenly get worse. You leak stool or have blood in your stool. Your abdomen is bloated. You have severe pain in your abdomen. You feel dizzy or you faint. Summary Constipation is when a person has fewer than three bowel movements in a week, has difficulty having a bowel movement, or has stools (feces) that are dry, hard, or larger than normal. Eat foods that have a lot of fiber, such as beans, whole grains, and fresh fruits and vegetables. Drink enough fluid to keep your urine pale yellow. Take over-the-counter and prescription medicines only as told by your health care provider. This includes any fiber supplements. This information is not intended to replace advice given to you by your health care provider. Make sure you discuss any questions you  have with your health care provider. Document Revised: 03/29/2019 Document Reviewed:  03/29/2019 Elsevier Patient Education  [place],   Merri Ray, MD [place] 02/27/22 9:07 AM

## 2022-02-27 NOTE — Patient Instructions (Addendum)
Try miralax once today. Start back on fiber, fluids and bland diet this weekend. If bowels are moving more normally, I think the discomfort will improve. If not improving in next 3-4 days, call and I will order a CT scan. Less likely at this time. Return to the clinic or go to the nearest emergency room if any of your symptoms worsen or new symptoms occur.  Abdominal Pain, Adult Pain in the abdomen (abdominal pain) can be caused by many things. Often, abdominal pain is not serious and it gets better with no treatment or by being treated at home. However, sometimes abdominal pain is serious. Your health care provider will ask questions about your medical history and do a physical exam to try to determine the cause of your abdominal pain. Follow these instructions at home: Medicines Take over-the-counter and prescription medicines only as told by your health care provider. Do not take a laxative unless told by your health care provider. General instructions  Watch your condition for any changes. Drink enough fluid to keep your urine pale yellow. Keep all follow-up visits as told by your health care provider. This is important. Contact a health care provider if: Your abdominal pain changes or gets worse. You are not hungry or you lose weight without trying. You are constipated or have diarrhea for more than 2-3 days. You have pain when you urinate or have a bowel movement. Your abdominal pain wakes you up at night. Your pain gets worse with meals, after eating, or with certain foods. You are vomiting and cannot keep anything down. You have a fever. You have blood in your urine. Get help right away if: Your pain does not go away as soon as your health care provider told you to expect. You cannot stop vomiting. Your pain is only in areas of the abdomen, such as the right side or the left lower portion of the abdomen. Pain on the right side could be caused by appendicitis. You have bloody or  black stools, or stools that look like tar. You have severe pain, cramping, or bloating in your abdomen. You have signs of dehydration, such as: Dark urine, very little urine, or no urine. Cracked lips. Dry mouth. Sunken eyes. Sleepiness. Weakness. You have trouble breathing or chest pain. Summary Often, abdominal pain is not serious and it gets better with no treatment or by being treated at home. However, sometimes abdominal pain is serious. Watch your condition for any changes. Take over-the-counter and prescription medicines only as told by your health care provider. Contact a health care provider if your abdominal pain changes or gets worse. Get help right away if you have severe pain, cramping, or bloating in your abdomen. This information is not intended to replace advice given to you by your health care provider. Make sure you discuss any questions you have with your health care provider. Document Revised: 06/30/2019 Document Reviewed: 09/19/2018 Elsevier Patient Education  [place].   Constipation, Adult Constipation is when a person has fewer than three bowel movements in a week, has difficulty having a bowel movement, or has stools (feces) that are dry, hard, or larger than normal. Constipation may be caused by an underlying condition. It may become worse with age if a person takes certain medicines and does not take in enough fluids. Follow these instructions at home: Eating and drinking  Eat foods that have a lot of fiber, such as beans, whole grains, and fresh fruits and vegetables. Limit foods that are  low in fiber and high in fat and processed sugars, such as fried or sweet foods. These include french fries, hamburgers, cookies, candies, and soda. Drink enough fluid to keep your urine pale yellow. General instructions Exercise regularly or as told by your health care provider. Try to do 150 minutes of moderate exercise each week. Use the bathroom when you have  the urge to go. Do not hold it in. Take over-the-counter and prescription medicines only as told by your health care provider. This includes any fiber supplements. During bowel movements: Practice deep breathing while relaxing the lower abdomen. Practice pelvic floor relaxation. Watch your condition for any changes. Let your health care provider know about them. Keep all follow-up visits as told by your health care provider. This is important. Contact a health care provider if: You have pain that gets worse. You have a fever. You do not have a bowel movement after 4 days. You vomit. You are not hungry or you lose weight. You are bleeding from the opening between the buttocks (anus). You have thin, pencil-like stools. Get help right away if: You have a fever and your symptoms suddenly get worse. You leak stool or have blood in your stool. Your abdomen is bloated. You have severe pain in your abdomen. You feel dizzy or you faint. Summary Constipation is when a person has fewer than three bowel movements in a week, has difficulty having a bowel movement, or has stools (feces) that are dry, hard, or larger than normal. Eat foods that have a lot of fiber, such as beans, whole grains, and fresh fruits and vegetables. Drink enough fluid to keep your urine pale yellow. Take over-the-counter and prescription medicines only as told by your health care provider. This includes any fiber supplements. This information is not intended to replace advice given to you by your health care provider. Make sure you discuss any questions you have with your health care provider. Document Revised: 03/29/2019 Document Reviewed: 03/29/2019 Elsevier Patient Education  [place].

## 2022-03-02 DIAGNOSIS — M25562 Pain in left knee: Secondary | ICD-10-CM | POA: Diagnosis not present

## 2022-03-02 DIAGNOSIS — M47816 Spondylosis without myelopathy or radiculopathy, lumbar region: Secondary | ICD-10-CM | POA: Diagnosis not present

## 2022-03-17 DIAGNOSIS — M47816 Spondylosis without myelopathy or radiculopathy, lumbar region: Secondary | ICD-10-CM | POA: Diagnosis not present

## 2022-03-30 DIAGNOSIS — M1712 Unilateral primary osteoarthritis, left knee: Secondary | ICD-10-CM | POA: Diagnosis not present

## 2022-03-30 DIAGNOSIS — M47816 Spondylosis without myelopathy or radiculopathy, lumbar region: Secondary | ICD-10-CM | POA: Diagnosis not present

## 2022-04-21 DIAGNOSIS — M47816 Spondylosis without myelopathy or radiculopathy, lumbar region: Secondary | ICD-10-CM | POA: Diagnosis not present

## 2022-05-06 DIAGNOSIS — M47816 Spondylosis without myelopathy or radiculopathy, lumbar region: Secondary | ICD-10-CM | POA: Diagnosis not present

## 2022-06-02 DIAGNOSIS — M47816 Spondylosis without myelopathy or radiculopathy, lumbar region: Secondary | ICD-10-CM | POA: Diagnosis not present

## 2022-06-11 DIAGNOSIS — M47816 Spondylosis without myelopathy or radiculopathy, lumbar region: Secondary | ICD-10-CM | POA: Diagnosis not present

## 2022-06-30 DIAGNOSIS — H2513 Age-related nuclear cataract, bilateral: Secondary | ICD-10-CM | POA: Diagnosis not present

## 2022-06-30 DIAGNOSIS — H524 Presbyopia: Secondary | ICD-10-CM | POA: Diagnosis not present

## 2022-07-14 DIAGNOSIS — M47816 Spondylosis without myelopathy or radiculopathy, lumbar region: Secondary | ICD-10-CM | POA: Diagnosis not present

## 2022-07-24 DIAGNOSIS — M533 Sacrococcygeal disorders, not elsewhere classified: Secondary | ICD-10-CM | POA: Diagnosis not present

## 2022-07-24 DIAGNOSIS — M545 Low back pain, unspecified: Secondary | ICD-10-CM | POA: Diagnosis not present

## 2022-07-28 ENCOUNTER — Other Ambulatory Visit: Payer: Self-pay | Admitting: Orthopedic Surgery

## 2022-07-28 ENCOUNTER — Other Ambulatory Visit: Payer: Self-pay | Admitting: Family Medicine

## 2022-07-28 DIAGNOSIS — M5459 Other low back pain: Secondary | ICD-10-CM

## 2022-07-28 DIAGNOSIS — R350 Frequency of micturition: Secondary | ICD-10-CM

## 2022-08-31 ENCOUNTER — Ambulatory Visit
Admission: RE | Admit: 2022-08-31 | Discharge: 2022-08-31 | Disposition: A | Discharge status: Home / Self Care | Payer: Medicare Other | Source: Ambulatory Visit | Attending: Orthopedic Surgery | Admitting: Orthopedic Surgery

## 2022-08-31 DIAGNOSIS — M5459 Other low back pain: Secondary | ICD-10-CM

## 2022-08-31 DIAGNOSIS — M25551 Pain in right hip: Secondary | ICD-10-CM | POA: Diagnosis not present

## 2022-08-31 MED ORDER — METHYLPREDNISOLONE ACETATE 40 MG/ML INJ SUSP (RADIOLOG
120.0000 mg | Freq: Once | INTRAMUSCULAR | Status: AC
  Administered 2022-08-31: 120 mg via INTRA_ARTICULAR

## 2022-09-04 DIAGNOSIS — M533 Sacrococcygeal disorders, not elsewhere classified: Secondary | ICD-10-CM | POA: Diagnosis not present

## 2022-09-07 ENCOUNTER — Other Ambulatory Visit: Payer: Self-pay | Admitting: Orthopedic Surgery

## 2022-09-07 DIAGNOSIS — M545 Low back pain, unspecified: Secondary | ICD-10-CM

## 2022-09-21 ENCOUNTER — Ambulatory Visit
Admission: RE | Admit: 2022-09-21 | Discharge: 2022-09-21 | Disposition: A | Discharge status: Home / Self Care | Payer: Medicare Other | Source: Ambulatory Visit | Attending: Orthopedic Surgery | Admitting: Orthopedic Surgery

## 2022-09-21 DIAGNOSIS — M545 Low back pain, unspecified: Secondary | ICD-10-CM

## 2022-09-21 DIAGNOSIS — M461 Sacroiliitis, not elsewhere classified: Secondary | ICD-10-CM | POA: Diagnosis not present

## 2022-09-21 DIAGNOSIS — M533 Sacrococcygeal disorders, not elsewhere classified: Secondary | ICD-10-CM | POA: Diagnosis not present

## 2022-09-21 MED ORDER — METHYLPREDNISOLONE ACETATE 40 MG/ML INJ SUSP (RADIOLOG: 120.0000 mg | Freq: Once | INTRAMUSCULAR | Status: DC

## 2022-10-10 DIAGNOSIS — R030 Elevated blood-pressure reading, without diagnosis of hypertension: Secondary | ICD-10-CM | POA: Diagnosis not present

## 2022-10-10 DIAGNOSIS — J209 Acute bronchitis, unspecified: Secondary | ICD-10-CM | POA: Diagnosis not present

## 2022-10-10 DIAGNOSIS — R059 Cough, unspecified: Secondary | ICD-10-CM | POA: Diagnosis not present

## 2022-10-20 ENCOUNTER — Telehealth: Payer: Self-pay | Admitting: Family Medicine

## 2022-10-20 NOTE — Telephone Encounter (Addendum)
[facility] [employer] Information - no charge sheet attached. In front bin.

## 2022-10-21 NOTE — Telephone Encounter (Signed)
Placed in folder

## 2022-11-26 ENCOUNTER — Other Ambulatory Visit: Payer: Self-pay | Admitting: Family Medicine

## 2022-11-26 DIAGNOSIS — R350 Frequency of micturition: Secondary | ICD-10-CM

## 2022-11-27 DIAGNOSIS — M4696 Unspecified inflammatory spondylopathy, lumbar region: Secondary | ICD-10-CM | POA: Diagnosis not present

## 2022-12-17 DIAGNOSIS — M47816 Spondylosis without myelopathy or radiculopathy, lumbar region: Secondary | ICD-10-CM | POA: Diagnosis not present

## 2023-01-14 DIAGNOSIS — M67431 Ganglion, right wrist: Secondary | ICD-10-CM | POA: Diagnosis not present

## 2023-01-26 DIAGNOSIS — M47816 Spondylosis without myelopathy or radiculopathy, lumbar region: Secondary | ICD-10-CM | POA: Diagnosis not present

## 2023-02-04 DIAGNOSIS — M6281 Muscle weakness (generalized): Secondary | ICD-10-CM | POA: Diagnosis not present

## 2023-02-04 DIAGNOSIS — M47816 Spondylosis without myelopathy or radiculopathy, lumbar region: Secondary | ICD-10-CM | POA: Diagnosis not present

## 2023-02-09 DIAGNOSIS — M6281 Muscle weakness (generalized): Secondary | ICD-10-CM | POA: Diagnosis not present

## 2023-02-09 DIAGNOSIS — M47816 Spondylosis without myelopathy or radiculopathy, lumbar region: Secondary | ICD-10-CM | POA: Diagnosis not present

## 2023-02-11 DIAGNOSIS — M6281 Muscle weakness (generalized): Secondary | ICD-10-CM | POA: Diagnosis not present

## 2023-02-11 DIAGNOSIS — M47816 Spondylosis without myelopathy or radiculopathy, lumbar region: Secondary | ICD-10-CM | POA: Diagnosis not present

## 2023-02-16 DIAGNOSIS — M6281 Muscle weakness (generalized): Secondary | ICD-10-CM | POA: Diagnosis not present

## 2023-02-16 DIAGNOSIS — M47816 Spondylosis without myelopathy or radiculopathy, lumbar region: Secondary | ICD-10-CM | POA: Diagnosis not present

## 2023-02-18 DIAGNOSIS — M47816 Spondylosis without myelopathy or radiculopathy, lumbar region: Secondary | ICD-10-CM | POA: Diagnosis not present

## 2023-02-18 DIAGNOSIS — M6281 Muscle weakness (generalized): Secondary | ICD-10-CM | POA: Diagnosis not present

## 2023-02-22 ENCOUNTER — Other Ambulatory Visit: Payer: Self-pay | Admitting: Family Medicine

## 2023-02-22 DIAGNOSIS — R350 Frequency of micturition: Secondary | ICD-10-CM

## 2023-02-23 DIAGNOSIS — M47816 Spondylosis without myelopathy or radiculopathy, lumbar region: Secondary | ICD-10-CM | POA: Diagnosis not present

## 2023-02-23 DIAGNOSIS — M6281 Muscle weakness (generalized): Secondary | ICD-10-CM | POA: Diagnosis not present

## 2023-02-25 DIAGNOSIS — M6281 Muscle weakness (generalized): Secondary | ICD-10-CM | POA: Diagnosis not present

## 2023-02-25 DIAGNOSIS — M47816 Spondylosis without myelopathy or radiculopathy, lumbar region: Secondary | ICD-10-CM | POA: Diagnosis not present

## 2023-02-27 ENCOUNTER — Other Ambulatory Visit: Payer: Self-pay | Admitting: Family Medicine

## 2023-02-27 DIAGNOSIS — R350 Frequency of micturition: Secondary | ICD-10-CM

## 2023-03-02 DIAGNOSIS — M47816 Spondylosis without myelopathy or radiculopathy, lumbar region: Secondary | ICD-10-CM | POA: Diagnosis not present

## 2023-03-02 DIAGNOSIS — M6281 Muscle weakness (generalized): Secondary | ICD-10-CM | POA: Diagnosis not present

## 2023-03-10 DIAGNOSIS — M47816 Spondylosis without myelopathy or radiculopathy, lumbar region: Secondary | ICD-10-CM | POA: Diagnosis not present

## 2023-03-10 DIAGNOSIS — M6281 Muscle weakness (generalized): Secondary | ICD-10-CM | POA: Diagnosis not present

## 2023-03-16 DIAGNOSIS — M47816 Spondylosis without myelopathy or radiculopathy, lumbar region: Secondary | ICD-10-CM | POA: Diagnosis not present

## 2023-03-16 DIAGNOSIS — M6281 Muscle weakness (generalized): Secondary | ICD-10-CM | POA: Diagnosis not present

## 2023-03-23 DIAGNOSIS — M47816 Spondylosis without myelopathy or radiculopathy, lumbar region: Secondary | ICD-10-CM | POA: Diagnosis not present

## 2023-03-23 DIAGNOSIS — M6281 Muscle weakness (generalized): Secondary | ICD-10-CM | POA: Diagnosis not present

## 2023-03-25 DIAGNOSIS — M47816 Spondylosis without myelopathy or radiculopathy, lumbar region: Secondary | ICD-10-CM | POA: Diagnosis not present

## 2023-03-25 DIAGNOSIS — M6281 Muscle weakness (generalized): Secondary | ICD-10-CM | POA: Diagnosis not present

## 2023-03-30 DIAGNOSIS — M6281 Muscle weakness (generalized): Secondary | ICD-10-CM | POA: Diagnosis not present

## 2023-03-30 DIAGNOSIS — M47816 Spondylosis without myelopathy or radiculopathy, lumbar region: Secondary | ICD-10-CM | POA: Diagnosis not present

## 2023-04-01 DIAGNOSIS — M6281 Muscle weakness (generalized): Secondary | ICD-10-CM | POA: Diagnosis not present

## 2023-04-01 DIAGNOSIS — M47816 Spondylosis without myelopathy or radiculopathy, lumbar region: Secondary | ICD-10-CM | POA: Diagnosis not present

## 2023-04-06 DIAGNOSIS — M47816 Spondylosis without myelopathy or radiculopathy, lumbar region: Secondary | ICD-10-CM | POA: Diagnosis not present

## 2023-04-06 DIAGNOSIS — M6281 Muscle weakness (generalized): Secondary | ICD-10-CM | POA: Diagnosis not present

## 2023-04-08 DIAGNOSIS — M6281 Muscle weakness (generalized): Secondary | ICD-10-CM | POA: Diagnosis not present

## 2023-04-08 DIAGNOSIS — M47816 Spondylosis without myelopathy or radiculopathy, lumbar region: Secondary | ICD-10-CM | POA: Diagnosis not present

## 2023-04-13 DIAGNOSIS — M47816 Spondylosis without myelopathy or radiculopathy, lumbar region: Secondary | ICD-10-CM | POA: Diagnosis not present

## 2023-04-13 DIAGNOSIS — M6281 Muscle weakness (generalized): Secondary | ICD-10-CM | POA: Diagnosis not present

## 2023-04-15 DIAGNOSIS — M6281 Muscle weakness (generalized): Secondary | ICD-10-CM | POA: Diagnosis not present

## 2023-04-15 DIAGNOSIS — M47816 Spondylosis without myelopathy or radiculopathy, lumbar region: Secondary | ICD-10-CM | POA: Diagnosis not present

## 2023-04-29 DIAGNOSIS — M6281 Muscle weakness (generalized): Secondary | ICD-10-CM | POA: Diagnosis not present

## 2023-04-29 DIAGNOSIS — M47816 Spondylosis without myelopathy or radiculopathy, lumbar region: Secondary | ICD-10-CM | POA: Diagnosis not present

## 2023-07-27 ENCOUNTER — Other Ambulatory Visit: Payer: Self-pay | Admitting: Family Medicine

## 2023-07-27 DIAGNOSIS — R350 Frequency of micturition: Secondary | ICD-10-CM

## 2023-08-02 DIAGNOSIS — H2513 Age-related nuclear cataract, bilateral: Secondary | ICD-10-CM | POA: Diagnosis not present

## 2023-08-02 DIAGNOSIS — H524 Presbyopia: Secondary | ICD-10-CM | POA: Diagnosis not present

## 2023-10-08 DIAGNOSIS — M5416 Radiculopathy, lumbar region: Secondary | ICD-10-CM | POA: Diagnosis not present

## 2023-10-11 DIAGNOSIS — M5416 Radiculopathy, lumbar region: Secondary | ICD-10-CM | POA: Diagnosis not present

## 2023-10-20 DIAGNOSIS — M5416 Radiculopathy, lumbar region: Secondary | ICD-10-CM | POA: Diagnosis not present

## 2023-11-16 DIAGNOSIS — M47816 Spondylosis without myelopathy or radiculopathy, lumbar region: Secondary | ICD-10-CM | POA: Diagnosis not present

## 2023-12-01 DIAGNOSIS — M47816 Spondylosis without myelopathy or radiculopathy, lumbar region: Secondary | ICD-10-CM | POA: Diagnosis not present

## 2023-12-02 DIAGNOSIS — M19041 Primary osteoarthritis, right hand: Secondary | ICD-10-CM | POA: Diagnosis not present

## 2023-12-02 DIAGNOSIS — M1811 Unilateral primary osteoarthritis of first carpometacarpal joint, right hand: Secondary | ICD-10-CM | POA: Diagnosis not present

## 2023-12-26 ENCOUNTER — Other Ambulatory Visit: Payer: Self-pay | Admitting: Family Medicine

## 2023-12-26 DIAGNOSIS — R350 Frequency of micturition: Secondary | ICD-10-CM

## 2024-01-04 DIAGNOSIS — M19041 Primary osteoarthritis, right hand: Secondary | ICD-10-CM | POA: Diagnosis not present

## 2024-01-04 DIAGNOSIS — M1811 Unilateral primary osteoarthritis of first carpometacarpal joint, right hand: Secondary | ICD-10-CM | POA: Diagnosis not present

## 2024-01-07 DIAGNOSIS — M47816 Spondylosis without myelopathy or radiculopathy, lumbar region: Secondary | ICD-10-CM | POA: Diagnosis not present

## 2024-02-01 ENCOUNTER — Other Ambulatory Visit: Payer: Self-pay

## 2024-02-01 ENCOUNTER — Ambulatory Visit: Payer: Self-pay

## 2024-02-01 DIAGNOSIS — R350 Frequency of micturition: Secondary | ICD-10-CM

## 2024-02-01 MED ORDER — TAMSULOSIN HCL 0.4 MG PO CAPS: ORAL_CAPSULE | ORAL | 3 refills | Status: DC

## 2024-02-01 NOTE — Telephone Encounter (Signed)
 Appt made for 02/03/24. Refill sent to pharmacy.

## 2024-02-01 NOTE — Telephone Encounter (Signed)
 FYI Only or Action Required?: Action required by provider: medication refill request.  Patient was last seen in primary care on 02/27/2022 by Levora Reyes SAUNDERS, MD.  Called Nurse Triage reporting Dysuria.  Symptoms began several weeks ago.  Interventions attempted: Other: refill requested.  Symptoms are: gradually worsening.  Triage Disposition: See Physician Within 24 Hours  Patient/caregiver understands and will follow disposition?: Yes, but will wait  Copied from CRM [id number]. Topic: Clinical - Red Word Triage >> Feb 01, 2024  3:33 PM Mesmerise C wrote: Red Word that prompted transfer to Nurse Triage: Patient stated he has a bit of pain when he urinates was taking tamsulosin  and since stopped taking it has gotten worse and his diverticulitis has been bothering him as well Reason for Disposition  All other males with painful urination  Answer Assessment - Initial Assessment Questions Additional info: Patient has developed occasional dysuria since out of Tamsulosin , he requested refill on 12/26/23 but was refused due to needing an appointment, patient was not aware he was overdue or needing to scheduling. Next available appointment with pcp is scheduled on 02/03/24. Patient would appreciate if Tamsulosin  could be refilled before appointment but understanding if unable.    1. SEVERITY: How bad is the pain?  (e.g., Scale 1-10; mild, moderate, or severe)     mild 2. FREQUENCY: How many times have you had painful urination today?      Slight increased  3. PATTERN: Is pain present every time you urinate or just sometimes?      Sometimes-since Tamsulosin  was refused 4. ONSET: When did the painful urination start?      Several days 5. FEVER: Do you have a fever? If Yes, ask: What is your temperature, how was it measured, and when did it start?     Denies  6. PAST UTI: Have you had a urine infection before? If Yes, ask: When was the last time? and What happened that time?        7. CAUSE: What do you think is causing the painful urination?      Out of tamsulosin  8. OTHER SYMPTOMS: Do you have any other symptoms? (e.g., flank pain, penis discharge, scrotal pain, blood in urine)     Diverticulosis flair-Diarrhea and constipsation  Protocols used: Urination Pain - Male-A-AH

## 2024-02-03 ENCOUNTER — Ambulatory Visit (INDEPENDENT_AMBULATORY_CARE_PROVIDER_SITE_OTHER): Admitting: Family Medicine

## 2024-02-03 ENCOUNTER — Encounter: Payer: Self-pay | Admitting: Family Medicine

## 2024-02-03 VITALS — BP 126/74 | HR 87 | Temp 98.4°F | Resp 24 | Ht 67.0 in | Wt 162.4 lb

## 2024-02-03 DIAGNOSIS — Z87898 Personal history of other specified conditions: Secondary | ICD-10-CM | POA: Diagnosis not present

## 2024-02-03 DIAGNOSIS — Z23 Encounter for immunization: Secondary | ICD-10-CM

## 2024-02-03 DIAGNOSIS — R195 Other fecal abnormalities: Secondary | ICD-10-CM | POA: Diagnosis not present

## 2024-02-03 DIAGNOSIS — R109 Unspecified abdominal pain: Secondary | ICD-10-CM | POA: Diagnosis not present

## 2024-02-03 DIAGNOSIS — M1811 Unilateral primary osteoarthritis of first carpometacarpal joint, right hand: Secondary | ICD-10-CM | POA: Diagnosis not present

## 2024-02-03 NOTE — Progress Notes (Signed)
 Subjective:  Patient ID: Evan Lopez, male    DOB: 1940-09-01  Age: 83 y.o. MRN: [id number]  CC:  Chief Complaint  Patient presents with   Follow-up    Still having LLQ pain. Bowel movements go from soft to normal over the past couple months.     HPI Evan Lopez presents for  Follow-up.  Last visit with me in October 2023. Unfortunately spouse passed earlier this year, who was also my patient. Almost 6 months since her passing. He is doing ok. Some difficult times. Tries to stay busy. Denies needs at this time or depression.   Left lower quadrant abdominal pain Discussed in October 2023.  Prior history of diverticulosis, diverticulitis, and recurrent left lower quadrant abdominal pain.  Has been seen by January previously with colonoscopy in 2019, diverticulosis, external and internal nonbleeding hemorrhoids.  Cycles of diarrhea, followed by few normal days then constipation when discussed in October 2023.  Denied recent changes at that time.  Fiber supplements in the past did help with regularity but he had not been using at that time.  He was constipated when I saw him at last visit.  Thought to be less likely acute diverticulitis at that time and more likely discomfort from constipation.  Loose stool may have been leak around effect from constipation.  Recommended at that time he start fiber supplement daily, MiraLAX  once per day if constipated, bland diet and fluids in case that was early diverticulitis but advised to be seen if not improving within 3 to 4 days and CT imaging at that time if needed.   Still some intermittent left lower abdominal pain. No fever. Alternating diarrhea at times with normal stools. Normal stool this am. No blood in stool.  Last CT in 2022. No recent GI eval. Colonoscopy in 2023 - fiber in diet with supplement recommended - not taking now, but may have helped before. Bentyl  also an option for cramping - not taking. Does not remember taking.  Mild  discomfort in abdomen - off and on for few months. Last CT in 2022 - no diverticulitis, just diverticulitis.  Not feeling same sx' s currently as in past when had diverticulitis.     On tamsulosin  for suspected BPH, nocturia.  Helps when taking meds. No side effects. No dysuria.     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 Authorizing Provider  meloxicam  (MOBIC ) 15 MG tablet Take 15 mg by mouth daily.   Yes [provider]  omeprazole (PRILOSEC) 20 MG capsule Take 20 mg by mouth daily.   Yes [provider]  tamsulosin  (FLOMAX ) 0.4 MG CAPS capsule TAKE 1 CAPSULE(0.4 MG) BY MOUTH DAILY 02/01/24  Yes Levora Reyes SAUNDERS, MD  AMBULATORY  NON FORMULARY MEDICATION Take 2 capsules by mouth daily. Medication Name: SYNOGUT Patient not taking: Reported on 02/03/2024    [provider]  amoxicillin -clavulanate (AUGMENTIN ) 875-125 MG tablet Take 1 tablet by mouth 2 (two) times daily. Patient not taking: Reported on 02/03/2024 11/20/21   Kip Ade, NP  dicyclomine  (BENTYL ) 10 MG capsule Take 1 capsule (10 mg total) by mouth 3 (three) times daily. Patient not taking: Reported on 02/03/2024 06/12/21   Mansouraty, Aloha Raddle., MD  methocarbamol  (ROBAXIN )  500 MG tablet Take 1 tablet (500 mg total) by mouth 2 (two) times daily as needed for muscle spasms. Patient not taking: Reported on 02/03/2024 03/28/21   Kip Ade, NP  metroNIDAZOLE  (FLAGYL ) 500 MG tablet Take 1 tablet (500 mg total) by mouth 3 (three) times daily. Patient not taking: Reported on 02/03/2024 11/20/21   Kip Ade, NP  SUPREP BOWEL PREP  KIT 17.5-3.13-1.6 GM/177ML SOLN Take 1 kit by mouth as directed. For colonoscopy prep Patient not taking: Reported on 02/03/2024 05/27/21   Kennedy-Smith, Colleen M, NP  vitamin B-12 (CYANOCOBALAMIN) 1000 MCG tablet Take 1,000 mcg by mouth daily.    [provider]   Social History   Socioeconomic History   Marital status: Married    Spouse name: Not on file   Number of children: Not on file   Years of education: Not on file   Highest education level: Not on file  Occupational History   Not on file  Tobacco Use   Smoking status: Never   Smokeless tobacco: Never  Vaping Use   Vaping status: Never Used  Substance and Sexual Activity   Alcohol use: No    Alcohol/week: 0.0 standard drinks of alcohol   Drug use: No   Sexual activity: Yes  Other Topics Concern   Not on file  Social History Narrative   Not on file   Social Drivers of Lopez   Financial Resource Strain: Low Risk  (03/17/2021)   Overall Financial Resource Strain (CARDIA)    Difficulty of Paying Living Expenses: Not hard at all  Food Insecurity: No Food Insecurity (03/17/2021)   Hunger Vital Sign    Worried About Running Out of Food in the Last Year: Never true    Ran Out of Food in the Last Year: Never true  Transportation Needs: No Transportation Needs (03/17/2021)   PRAPARE - Administrator, Civil Service (Medical): No    Lack of Transportation (Non-Medical): No  Physical Activity: Inactive (03/17/2021)   Exercise Vital Sign    Days of Exercise per Week: 0 days    Minutes of Exercise per Session: 0 min  Stress: No Stress Concern  Present (03/17/2021)   Evan Lopez - Occupational Stress Questionnaire    Feeling of Stress : Not at all  Social Connections: Moderately Isolated (03/17/2021)   Social Connection and Isolation Panel    Frequency of Communication with Friends and Family: More than three times a week    Frequency of Social Gatherings with Friends and Family: More than three times a week    Attends Religious Services: Never    Database administrator or Organizations: No    Attends Banker Meetings: Never    Marital Status: Married  Catering manager Violence: Not At Risk (03/17/2021)   Humiliation, Afraid, Rape, and Kick questionnaire    Fear of Current or Ex-Partner: No    Emotionally Abused: No    Physically Abused: No    Sexually  Abused: No    Review of Systems   Objective:   Vitals:   02/03/24 1558  BP: 126/74  Pulse: 87  Resp: (!) 24  Temp: 98.4 F (36.9 C)  TempSrc: Temporal  SpO2: 97%  Weight: 162 lb 6.4 oz (73.7 kg)  Height: 5' 7 (1.702 m)     Physical Exam Vitals reviewed.  Constitutional:      Appearance: He is well-developed.  HENT:     Head: Normocephalic and atraumatic.  Neck:     Vascular: No carotid bruit or JVD.  Cardiovascular:     Rate and Rhythm: Normal rate and regular rhythm.     Heart sounds: Normal heart sounds. No murmur heard. Pulmonary:     Effort: Pulmonary effort is normal.     Breath sounds: Normal breath sounds. No rales.  Abdominal:     General: Abdomen is flat. Bowel sounds are normal. There is no distension.     Tenderness: There is abdominal tenderness (Minimal with deep palpation on the left, but no rebound, guarding or discomfort with light palpation.).  Musculoskeletal:     Right lower leg: No edema.     Left lower leg: No edema.  Skin:    General: Skin is warm and dry.  Neurological:     Mental Status: He is alert and oriented to person, place, and time.  Psychiatric:        Mood and Affect:  Mood normal.        Assessment & Plan:  Evan Lopez is a 83 y.o. male . Abdominal discomfort  Need for influenza vaccination - Plan: Flu vaccine HIGH DOSE PF(Fluzone Trivalent)  Change in stool  History of nocturia  Recurrent slight discomfort of abdomen, change in stools with alternating from normal to looser stools.  Constipation in the past.  Remote history of diverticulitis but not seen on his CT in 2022, and recent colonoscopy in 2023.  Denies similar symptoms currently that he had with diverticulitis flare in the past.  Given persistent symptoms, timing of symptoms, unlikely diverticulitis.  Fiber supplement, fiber in diet was recommended by gastroenterology in the past.  Given exam and history above I think would be reasonable to try slow incorporation of increased fiber in diet.  Recheck in 2 weeks and at that point can refer to gastroenterology if not improving.  Also discussed fiber supplement previously recommended by gastroenterology.  If any acute worsening of pain, advised to be seen right away and we can certainly check CT imaging to rule out diverticulitis, unlikely at this time.  Flu vaccine given.  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  2-week recheck.   No orders of the defined types were placed in this encounter.  Patient Instructions  For bowel movements and abdominal symptoms, it would be unlikely that you are having a diverticulitis flare based on the mild symptoms and timing of your symptoms.  Try fiber supplement like Fiber-Con  (recommended by gastroenterology prior) once per day and try to increase fiber in diet - slowly increase. See info below. If any acute worsening of pain be seen to make sure we do not need to check a CT scan.   Recheck in 2 weeks. No other med changes at this time.  Return to the clinic or go to the nearest emergency room if any of your symptoms worsen or new symptoms  occur.   High-Fiber Eating Plan Fiber, also called dietary fiber, is found  in foods such as fruits, vegetables, whole grains, and beans. A high-fiber diet can be good for your Lopez. Your Lopez care provider may recommend a high-fiber diet to help: Prevent trouble pooping (constipation). Lower your cholesterol. Treat the following conditions: Hemorrhoids. This is inflammation of veins in the anus. Inflammation of specific areas of the digestive tract. Irritable bowel syndrome (IBS). This is a problem of the large intestine, also called the colon, that sometimes causes belly pain and bloating. Prevent overeating as part of a weight-loss plan. Lower the risk of heart disease, type 2 diabetes, and certain cancers. What are tips for following this plan? Reading food labels  Check the nutrition facts label on foods for the amount of dietary fiber. Choose foods that have 4 grams of fiber or more per serving. The recommended goals for how much fiber you should eat each day include: Males 23 years old or younger: 30-34 g. Males over 58 years old: 28-34 g. Females 46 years old or younger: 25-28 g. Females over 35 years old: 22-25 g. Your daily fiber goal is _____________ g. Shopping Choose whole fruits and vegetables instead of processed. For example, choose apples instead of apple juice or applesauce. Choose a variety of high-fiber foods such as avocados, lentils, oats, and pinto beans. Read the nutrition facts label on foods. Check for foods with added fiber. These foods often have high sugar and salt (sodium) amounts per serving. Cooking Use whole-grain flour for baking and cooking. Cook with brown rice instead of white rice. Make meals that have a lot of beans and vegetables in them, such as chili or vegetable-based soups. Meal planning Start the day with a breakfast that is high in fiber, such as a cereal that has 5 g of fiber or more per serving. Eat breads and cereals that are made  with whole-grain flour instead of refined flour or white flour. Eat brown rice, bulgur wheat, or millet instead of white rice. Use beans in place of meat in soups, salads, and pasta dishes. Be sure that half of the grains you eat each day are whole grains. General information You can get the recommended amount of dietary fiber by: Eating a variety of fruits, vegetables, grains, nuts, and beans. Taking a fiber supplement if you aren't able to eat enough fiber. It's better to get fiber through food than from a supplement. Slowly increase how much fiber you eat. If you increase the amount of fiber you eat too quickly, you may have bloating, cramping, or gas. Drink plenty of water to help you digest fiber. Choose high-fiber snacks, such as berries, raw vegetables, nuts, and popcorn. What foods should I eat? Fruits Berries. Pears. Apples. Oranges. Avocado. Prunes and raisins. Dried figs. Vegetables Sweet potatoes. Spinach. Kale. Artichokes. Cabbage. Broccoli. Cauliflower. Green peas. Carrots. Squash. Grains Whole-grain breads. Multigrain cereal. Oats and oatmeal. Brown rice. Barley. Bulgur wheat. Millet. Quinoa. Bran muffins. Popcorn. Rye wafer crackers. Meats and other proteins Navy beans, kidney beans, and pinto beans. Soybeans. Split peas. Lentils. Nuts and seeds. Dairy Fiber-fortified yogurt. Fortified means that fiber has been added to the product. Beverages Fiber-fortified soy milk. Fiber-fortified orange juice. Other foods Fiber bars. The items listed above may not be all the foods and drinks you can have. Talk to a dietitian to learn more. What foods should I avoid? Fruits Fruit juice. Cooked, strained fruit. Vegetables Fried potatoes. Canned vegetables. Well-cooked vegetables. Grains White bread. Pasta made with refined flour. White rice. Meats and other proteins Fatty meat. Brien  chicken or fried fish. Dairy Milk. Cream cheese. Sour cream. Fats and  oils Butters. Beverages Soft drinks. Other foods Cakes and pastries. The items listed above may not be all the foods and drinks you should avoid. Talk to a dietitian to learn more. This information is not intended to replace advice given to you by your Lopez care provider. Make sure you discuss any questions you have with your Lopez care provider. Document Revised: 08/03/2022 Document Reviewed: 08/03/2022 Elsevier Patient Education  2024 Elsevier Inc.      Signed,   Reyes Pines, MD [facility], [facility] 02/03/24 4:33 PM

## 2024-02-03 NOTE — Patient Instructions (Addendum)
 For bowel movements and abdominal symptoms, it would be unlikely that you are having a diverticulitis flare based on the mild symptoms and timing of your symptoms.  Try fiber supplement like Fiber-Con  (recommended by gastroenterology prior) once per day and try to increase fiber in diet - slowly increase. See info below. If any acute worsening of pain be seen to make sure we do not need to check a CT scan.   Recheck in 2 weeks. No other med changes at this time.  Return to the clinic or go to the nearest emergency room if any of your symptoms worsen or new symptoms occur.   High-Fiber Eating Plan Fiber, also called dietary fiber, is found in foods such as fruits, vegetables, whole grains, and beans. A high-fiber diet can be good for your health. Your health care provider may recommend a high-fiber diet to help: Prevent trouble pooping (constipation). Lower your cholesterol. Treat the following conditions: Hemorrhoids. This is inflammation of veins in the anus. Inflammation of specific areas of the digestive tract. Irritable bowel syndrome (IBS). This is a problem of the large intestine, also called the colon, that sometimes causes belly pain and bloating. Prevent overeating as part of a weight-loss plan. Lower the risk of heart disease, type 2 diabetes, and certain cancers. What are tips for following this plan? Reading food labels  Check the nutrition facts label on foods for the amount of dietary fiber. Choose foods that have 4 grams of fiber or more per serving. The recommended goals for how much fiber you should eat each day include: Males 68 years old or younger: 30-34 g. Males over 32 years old: 28-34 g. Females 44 years old or younger: 25-28 g. Females over 36 years old: 22-25 g. Your daily fiber goal is _____________ g. Shopping Choose whole fruits and vegetables instead of processed. For example, choose apples instead of apple juice or applesauce. Choose a variety of high-fiber  foods such as avocados, lentils, oats, and pinto beans. Read the nutrition facts label on foods. Check for foods with added fiber. These foods often have high sugar and salt (sodium) amounts per serving. Cooking Use whole-grain flour for baking and cooking. Cook with brown rice instead of white rice. Make meals that have a lot of beans and vegetables in them, such as chili or vegetable-based soups. Meal planning Start the day with a breakfast that is high in fiber, such as a cereal that has 5 g of fiber or more per serving. Eat breads and cereals that are made with whole-grain flour instead of refined flour or white flour. Eat brown rice, bulgur wheat, or millet instead of white rice. Use beans in place of meat in soups, salads, and pasta dishes. Be sure that half of the grains you eat each day are whole grains. General information You can get the recommended amount of dietary fiber by: Eating a variety of fruits, vegetables, grains, nuts, and beans. Taking a fiber supplement if you aren't able to eat enough fiber. It's better to get fiber through food than from a supplement. Slowly increase how much fiber you eat. If you increase the amount of fiber you eat too quickly, you may have bloating, cramping, or gas. Drink plenty of water to help you digest fiber. Choose high-fiber snacks, such as berries, raw vegetables, nuts, and popcorn. What foods should I eat? Fruits Berries. Pears. Apples. Oranges. Avocado. Prunes and raisins. Dried figs. Vegetables Sweet potatoes. Spinach. Kale. Artichokes. Cabbage. Broccoli. Cauliflower. Green peas. Carrots.  Squash. Grains Whole-grain breads. Multigrain cereal. Oats and oatmeal. Brown rice. Barley. Bulgur wheat. Millet. Quinoa. Bran muffins. Popcorn. Rye wafer crackers. Meats and other proteins Navy beans, kidney beans, and pinto beans. Soybeans. Split peas. Lentils. Nuts and seeds. Dairy Fiber-fortified yogurt. Fortified means that fiber has been  added to the product. Beverages Fiber-fortified soy milk. Fiber-fortified orange juice. Other foods Fiber bars. The items listed above may not be all the foods and drinks you can have. Talk to a dietitian to learn more. What foods should I avoid? Fruits Fruit juice. Cooked, strained fruit. Vegetables Fried potatoes. Canned vegetables. Well-cooked vegetables. Grains White bread. Pasta made with refined flour. White rice. Meats and other proteins Fatty meat. Fried chicken or fried fish. Dairy Milk. Cream cheese. Sour cream. Fats and oils Butters. Beverages Soft drinks. Other foods Cakes and pastries. The items listed above may not be all the foods and drinks you should avoid. Talk to a dietitian to learn more. This information is not intended to replace advice given to you by your health care provider. Make sure you discuss any questions you have with your health care provider. Document Revised: 08/03/2022 Document Reviewed: 08/03/2022 Elsevier Patient Education  2024 [employer].

## 2024-02-07 DIAGNOSIS — M19012 Primary osteoarthritis, left shoulder: Secondary | ICD-10-CM | POA: Diagnosis not present

## 2024-02-21 ENCOUNTER — Ambulatory Visit: Admitting: Family Medicine

## 2024-02-21 ENCOUNTER — Ambulatory Visit: Payer: Self-pay | Admitting: Family Medicine

## 2024-02-21 ENCOUNTER — Encounter: Payer: Self-pay | Admitting: Family Medicine

## 2024-02-21 VITALS — BP 108/70 | HR 69 | Temp 98.4°F | Resp 17 | Ht 67.0 in | Wt 159.2 lb

## 2024-02-21 DIAGNOSIS — R519 Headache, unspecified: Secondary | ICD-10-CM

## 2024-02-21 DIAGNOSIS — R0609 Other forms of dyspnea: Secondary | ICD-10-CM

## 2024-02-21 DIAGNOSIS — R1032 Left lower quadrant pain: Secondary | ICD-10-CM

## 2024-02-21 DIAGNOSIS — R5383 Other fatigue: Secondary | ICD-10-CM | POA: Diagnosis not present

## 2024-02-21 DIAGNOSIS — R3989 Other symptoms and signs involving the genitourinary system: Secondary | ICD-10-CM

## 2024-02-21 DIAGNOSIS — R5381 Other malaise: Secondary | ICD-10-CM

## 2024-02-21 LAB — COMPREHENSIVE METABOLIC PANEL WITH GFR
ALT: 11 U/L (ref 0–53)
AST: 14 U/L (ref 0–37)
Albumin: 4.4 g/dL (ref 3.5–5.2)
Alkaline Phosphatase: 83 U/L (ref 39–117)
BUN: 13 mg/dL (ref 6–23)
CO2: 28 meq/L (ref 19–32)
Calcium: 9.5 mg/dL (ref 8.4–10.5)
Chloride: 104 meq/L (ref 96–112)
Creatinine, Ser: 1.02 mg/dL (ref 0.40–1.50)
GFR: 68.92 mL/min (ref >60.00)
Glucose, Bld: 85 mg/dL (ref 70–99)
Potassium: 3.6 meq/L (ref 3.5–5.1)
Sodium: 139 meq/L (ref 135–145)
Total Bilirubin: 1 mg/dL (ref 0.2–1.2)
Total Protein: 7.1 g/dL (ref 6.0–8.3)

## 2024-02-21 LAB — POCT URINALYSIS DIPSTICK
Bilirubin, UA: NEGATIVE
Blood, UA: NEGATIVE
Glucose, UA: NEGATIVE
Ketones, UA: NEGATIVE
Leukocytes, UA: NEGATIVE
Nitrite, UA: NEGATIVE
Protein, UA: NEGATIVE
Spec Grav, UA: 1.01 (ref 1.010–1.025)
Urobilinogen, UA: 0.2 U/dL
pH, UA: 6 (ref 5.0–8.0)

## 2024-02-21 LAB — POC COVID19 BINAXNOW: SARS Coronavirus 2 Ag: NEGATIVE

## 2024-02-21 LAB — CBC
HCT: 41.9 % (ref 39.0–52.0)
Hemoglobin: 14.4 g/dL (ref 13.0–17.0)
MCHC: 34.4 g/dL (ref 30.0–36.0)
MCV: 86.9 fl (ref 78.0–100.0)
Platelets: 206 K/uL (ref 150.0–400.0)
RBC: 4.82 Mil/uL (ref 4.22–5.81)
RDW: 13.8 % (ref 11.5–15.5)
WBC: 7.5 K/uL (ref 4.0–10.5)

## 2024-02-21 LAB — SEDIMENTATION RATE: Sed Rate: 10 mm/h (ref 0–20)

## 2024-02-21 LAB — LIPASE: Lipase: 14 U/L (ref 11.0–59.0)

## 2024-02-21 NOTE — Patient Instructions (Addendum)
 Glad to hear that the abdominal symptoms have improved.  I will repeat some blood work today with your recent fatigue general sense of not feeling well.  Urine testing was normal and you had a negative COVID test today.  If any concerns on labs I will let you know, will recheck in 1 week.   If any worsening be seen sooner, either here, urgent care or ER if needed.  Depending on lab results we can decide if other testing needed.  Let me know if there are questions and hang in there!  Fatigue If you have fatigue, you feel tired all the time and have a lack of energy or a lack of motivation. Fatigue may make it difficult to start or complete tasks because of exhaustion. Occasional or mild fatigue is often a normal response to activity or life. However, long-term (chronic) or extreme fatigue may be a symptom of a medical condition such as: Depression. Not having enough red blood cells or hemoglobin in the blood (anemia). A problem with a small gland located in the lower front part of the neck (thyroid  disorder). Rheumatologic conditions. These are problems related to the body's defense system (immune system). Infections, especially certain viral infections. Fatigue can also lead to negative health outcomes over time. Follow these instructions at home: Medicines Take over-the-counter and prescription medicines only as told by your health care provider. Take a multivitamin if told by your health care provider. Do not use herbal or dietary supplements unless they are approved by your health care provider. Eating and drinking  Avoid heavy meals in the evening. Eat a well-balanced diet, which includes lean proteins, whole grains, plenty of fruits and vegetables, and low-fat dairy products. Avoid eating or drinking too many products with caffeine in them. Avoid alcohol. Drink enough fluid to keep your urine pale yellow. Activity  Exercise regularly, as told by your health care provider. Use or  practice techniques to help you relax, such as yoga, tai chi, meditation, or massage therapy. Lifestyle Change situations that cause you stress. Try to keep your work and personal schedules in balance. Do not use recreational or illegal drugs. General instructions Monitor your fatigue for any changes. Go to bed and get up at the same time every day. Avoid fatigue by pacing yourself during the day and getting enough sleep at night. Maintain a healthy weight. Contact a health care provider if: Your fatigue does not get better. You have a fever. You suddenly lose or gain weight. You have headaches. You have trouble falling asleep or sleeping through the night. You feel angry, guilty, anxious, or sad. You have swelling in your legs or another part of your body. Get help right away if: You feel confused, feel like you might faint, or faint. Your vision is blurry or you have a severe headache. You have severe pain in your abdomen, your back, or the area between your waist and hips (pelvis). You have chest pain, shortness of breath, or an irregular or fast heartbeat. You are unable to urinate, or you urinate less than normal. You have abnormal bleeding from the rectum, nose, lungs, nipples, or, if you are male, the vagina. You vomit blood. You have thoughts about hurting yourself or others. These symptoms may be an emergency. Get help right away. Call 911. Do not wait to see if the symptoms will go away. Do not drive yourself to the hospital. Get help right away if you feel like you may hurt yourself or others, or  have thoughts about taking your own life. Go to your nearest emergency room or: Call 911. Call the National Suicide Prevention Lifeline at 8561482535 or 988. This is open 24 hours a day. Text the Crisis Text Line at 506-439-6042. Summary If you have fatigue, you feel tired all the time and have a lack of energy or a lack of motivation. Fatigue may make it difficult to start or  complete tasks because of exhaustion. Long-term (chronic) or extreme fatigue may be a symptom of a medical condition. Exercise regularly, as told by your health care provider. Change situations that cause you stress. Try to keep your work and personal schedules in balance. This information is not intended to replace advice given to you by your health care provider. Make sure you discuss any questions you have with your health care provider. Document Revised: 03/03/2021 Document Reviewed: 03/03/2021 Elsevier Patient Education  2024 [employer].

## 2024-02-21 NOTE — Progress Notes (Signed)
 Subjective:  Patient ID: Evan Lopez, male    DOB: Sep 21, 1940  Age: 83 y.o. MRN: [id number]  CC:  Chief Complaint  Patient presents with   Abdominal Pain    Follow up. Gotten better.    Bladder Pain    Relieves when using the bathroom    HPI Evan Lopez presents for   Follow-up for abdominal pain. Last visit September 11.  Intermittent left lower abdominal pain discussed at that time, some diarrhea at times alternating with normal stools but normal stool that morning.  Colonoscopy in 2023.  Bentyl  has been given as an option previously for cramping and fiber in the diet with the supplement recommended previously but had not been on either of those medicines at his last visit.  History of diverticulosis.  On tamsulosin  for BPH -helpful previously for nocturia.  Less likely diverticulitis flare at that visit given timing of symptoms.  We discussed the previous GI recommendations for fiber supplement and fiber in the diet and recommended a slow incorporation of increased fiber in diet initially with close follow-up.  Since last visit - has improved. Taking fiber pill daily. Daily bowel movements, no recent diarrhea. Only slight discomfort now - improved, but feels like it is continuing to improve.  No fever/n/v.  Discomfort in lower bladder area if he has a full bladder but no dysuria hematuria or other new urinary symptoms.  Relieved with urination.  Additional concerns today:  L neck pain, headache, fatigue: Started about a week ago - was out doing yard work - felt very tired, had to sit down - did not feel good. No chest pain/tightness. Possibly slightly short of breath with activity only - not at rest.  No fever, cough or known sick contacts.  Headache started 3 days ago - mild headache - lasted all day, took alleve - some relief. HA returned but minor.  No sore throat. No new nasal congestion or sore throat.  Sore in left shoulder blade past few days.  Cortisone injection in  shoulder a few weeks ago.  Left side of neck and base of neck sore, left temple sore. Occasional left jaw pain. No vision darkening/amaurosis fugax.  No history of heart disease.  No melena/hematochezia.   No neck stiffness, photophobia or phonophobia.          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 Authorizing Provider  ASPIRIN  81 PO Take 1 tablet by mouth daily.   Yes [provider]  meloxicam  (MOBIC ) 15 MG tablet Take 15 mg by mouth daily.   Yes [provider]  omeprazole (PRILOSEC) 20 MG capsule Take 20 mg by mouth daily.   Yes [provider]  tamsulosin  (FLOMAX ) 0.4 MG CAPS capsule  TAKE 1 CAPSULE(0.4 MG) BY MOUTH DAILY 02/01/24  Yes Levora Reyes SAUNDERS, MD  vitamin B-12 (CYANOCOBALAMIN) 1000 MCG tablet Take 1,000 mcg by mouth daily.   Yes [provider]   Social History   Socioeconomic History   Marital status: Married    Spouse name: Not on file   Number of children: Not on file   Years of education: Not on file   Highest education level: Not on file  Occupational History   Not on file  Tobacco Use   Smoking status: Never   Smokeless tobacco: Never  Vaping Use   Vaping status: Never Used  Substance and Sexual  Activity   Alcohol use: No    Alcohol/week: 0.0 standard drinks of alcohol   Drug use: No   Sexual activity: Yes  Other Topics Concern   Not on file  Social History Narrative   Not on file   Social Drivers of Health   Financial Resource Strain: Low Risk  (03/17/2021)   Overall Financial Resource Strain (CARDIA)    Difficulty of Paying Living Expenses: Not hard at all  Food Insecurity: No Food Insecurity (03/17/2021)   Hunger Vital Sign    Worried About Running Out of Food in the Last Year: Never true    Ran Out of Food in the Last Year: Never true  Transportation Needs: No Transportation Needs (03/17/2021)   PRAPARE - Administrator, Civil Service (Medical): No    Lack of Transportation (Non-Medical): No  Physical Activity: Inactive (03/17/2021)   Exercise Vital Sign    Days of Exercise per Week: 0 days    Minutes of Exercise per Session: 0 min  Stress: No Stress Concern Present (03/17/2021)   [employer] of Occupational Health - Occupational Stress Questionnaire    Feeling of Stress : Not at all  Social Connections: Moderately Isolated (03/17/2021)   Social Connection and Isolation Panel    Frequency of Communication with Friends and Family: More than three times a week    Frequency of Social Gatherings with Friends and Family: More than three times a week    Attends Religious Services: Never    Database administrator or Organizations: No    Attends Banker Meetings: Never    Marital Status: Married  Catering manager Violence: Not At Risk (03/17/2021)   Humiliation, Afraid, Rape, and Kick questionnaire    Fear of Current or Ex-Partner: No    Emotionally Abused: No    Physically Abused: No    Sexually Abused: No   Review of Systems - per HPI.   Objective:   Vitals:   02/21/24 1055  BP: 108/70  Pulse: 69  Resp: 17  Temp: 98.4 F (36.9 C)  TempSrc: Temporal  SpO2: 97%  Weight: 159 lb 3.2 oz (72.2 kg)  Height: 5' 7 (1.702 m)      Physical Exam Vitals reviewed.  Constitutional:      Appearance: He is well-developed.  HENT:     Head: Normocephalic and atraumatic.     Comments: Slight discomfort over the left temporal scalp but no rash, no cords. Neck:     Vascular: No carotid bruit or JVD.  Cardiovascular:     Rate and Rhythm: Normal rate and regular rhythm.     Heart sounds: Normal heart sounds. No murmur heard. Pulmonary:     Effort: Pulmonary effort is normal.     Breath sounds: Normal breath sounds. No rales.  Abdominal:  Tenderness: There is abdominal tenderness in the suprapubic area and left lower quadrant.     Comments: Primarily LLQ slight abd discomfort with deep palpation but no rebound or guarding.  Musculoskeletal:     Right lower leg: No edema.     Left lower leg: No edema.     Comments: No focal bony tenderness of C-spine but describes area discomfort into the paraspinals into the occipital scalp.  Minimal discomfort in the area with rotation but neck is supple.  Skin:    General: Skin is warm and dry.  Neurological:     Mental Status: He is alert and oriented to person, place, and time.  Psychiatric:        Mood and Affect: Mood normal.   EKG, sinus rhythm, rate 65, PR 196, QTc 409.EKG compared to 12/26/2012, V3 with slight change in QRS, but otherwise appears similar.  No apparent acute findings.  Results for orders placed or performed in visit on 02/21/24  POCT Urinalysis Dipstick   Collection Time: 02/21/24 11:05 AM  Result Value Ref Range   Color, UA yellow    Clarity, UA clear    Glucose, UA Negative Negative   Bilirubin, UA Negative    Ketones, UA Negative    Spec Grav, UA 1.010 1.010 - 1.025   Blood, UA Negative    pH, UA 6.0 5.0 - 8.0   Protein, UA Negative Negative   Urobilinogen, UA 0.2 0.2 or 1.0 E.U./dL   Nitrite, UA Negative    Leukocytes, UA Negative Negative   Appearance     Odor    POC COVID-19 BinaxNow   Collection Time: 02/21/24 11:43 AM  Result Value  Ref Range   SARS Coronavirus 2 Ag Negative Negative     Assessment & Plan:  NORVEL WENKER is a 83 y.o. male . LLQ abdominal pain - Plan: CBC, Comprehensive metabolic panel with GFR, Lipase  Bladder pain - Plan: POCT Urinalysis Dipstick  Malaise - Plan: Comprehensive metabolic panel with GFR, EKG 12-Lead, POC COVID-19 BinaxNow  Left temporal headache - Plan: POC COVID-19 BinaxNow, Sedimentation rate  Fatigue, unspecified type - Plan: CBC, EKG 12-Lead  DOE (dyspnea on exertion) - Plan: EKG 12-Lead, POC COVID-19 BinaxNow  Left lower quadrant abdominal pain has improved.  Minimal discomfort on exam, check labs as above.  Consider imaging if leukocytosis or abnormal labs but as long as he is continuing to improve, I feel like it is reasonable to monitor for now.  ER/urgent care/RTC precautions given.  New complaints of malaise as above after working outside.  Unsure if this could have been due to volume depletion, denies any cardiac symptoms other than feeling a little winded during the time but no true chest pain and no residual dyspnea.  No apparent acute findings on EKG.  Check CBC, CMP.  Will also check sed rate given headache and temporal aspect of headache as differential includes giant cell arteritis.  If elevated may need biopsy.  Denies any amaurosis fugax but possible jaw pain, locates primarily at TMJ.  May be unrelated.  Will recheck in 1 week., ER/urgent care/RTC precautions.  No orders of the defined types were placed in this encounter.  Patient Instructions  Glad to hear that the abdominal symptoms have improved.  I will repeat some blood work today with your recent fatigue general sense of not feeling well.  Urine testing was normal and you had a negative COVID test today.  If any concerns on labs I will  let you know, but if you are not improving in the next 4 to 5 days I want to see you back in the office.  If any worsening be seen sooner, either here, urgent care or ER if  needed.  Depending on lab results we can decide if other testing needed.  Let me know if there are questions and hang in there!    Signed,   Reyes Pines, MD [facility], [facility] 02/21/24 11:51 AM

## 2024-02-22 NOTE — Progress Notes (Signed)
 Lab results have been discussed.   Verbalized understanding? Yes  Are there any questions? No

## 2024-02-28 ENCOUNTER — Ambulatory Visit (INDEPENDENT_AMBULATORY_CARE_PROVIDER_SITE_OTHER): Admitting: Family Medicine

## 2024-02-28 ENCOUNTER — Encounter: Payer: Self-pay | Admitting: Family Medicine

## 2024-02-28 VITALS — BP 102/68 | HR 69 | Temp 98.4°F | Resp 12 | Ht 67.0 in | Wt 161.4 lb

## 2024-02-28 DIAGNOSIS — R1032 Left lower quadrant pain: Secondary | ICD-10-CM

## 2024-02-28 DIAGNOSIS — R519 Headache, unspecified: Secondary | ICD-10-CM | POA: Diagnosis not present

## 2024-02-28 DIAGNOSIS — R5383 Other fatigue: Secondary | ICD-10-CM

## 2024-02-28 DIAGNOSIS — R5381 Other malaise: Secondary | ICD-10-CM

## 2024-02-28 NOTE — Progress Notes (Signed)
 Subjective:  Patient ID: Evan Lopez, male    DOB: [birth year]  Age: 83 y.o. MRN: [id number]  CC:  Chief Complaint  Patient presents with   Fatigue    1 week follow up. Reports that he is doing better    HPI Evan Lopez presents for   Follow-up from September 29th.   Abdominal pain Improving at his September 29 visit.  Fiber pill daily, with daily bowel movements.  Only slight discomfort at that time but was continuing to improve.  Denied any dysuria or new urinary symptoms.  Plan to continue monitoring with ER precautions. Still some lower left soreness at times. Better than last visit as well, no n/v/diarrhea.  Last BM - yesterday - normal. Still on fiber supplement. BM daily. Urinating normally with tamsulosin  daily. No hematuria. No retention sx's. Able to empty bladder.     Left neck pain headache and fatigue Noted at his September 29 visit, had started 1 week prior doing yard work.  Onset of fatigue, malaise but no chest pain or tightness.  Slight dyspnea with activity only.  Mild headache for 3 days at that time.  Had received an injection in his shoulder a few weeks prior.  Was having some pain in the left side of his neck and the base of his neck along with left temple soreness, occasional left jaw pain without visual changes.  No photophobia, phonophobia or stiffness of the neck appreciated. It is thought that his fatigue may have been due to volume depletion, no acute findings on EKG.  CBC, CMP, sed rate were reassuring.   Since last week he has improved. Feeling better. Has been able to mow yard, yardwork last weekend without issue. Headache has resolved. Neck is better, only slight shoulder soreness. Drinking fluids. No new new symptoms. No chest pains.    History Patient Active Problem List   Diagnosis Date Noted   Aortic atherosclerosis 03/24/2021   Chest pain 05/13/2012   Syncope 05/13/2012   Hyperlipemia 06/24/2011   Kidney calculi 06/24/2011    Class:  Chronic   GERD (gastroesophageal reflux disease) 06/24/2011   DIVERTICULOSIS OF COLON 02/04/2009   NONSPECIFIC ABNORMAL FINDING IN STOOL CONTENTS 02/04/2009   Past Medical History:  Diagnosis Date   DIVERTICULOSIS OF COLON    GERD (gastroesophageal reflux disease)    Hyperlipidemia    Kidney calculi    Nonspecific abnormal finding in stool contents    Past Surgical History:  Procedure Laterality Date   APPENDECTOMY  1964   HEMORRHOID SURGERY  1984   No Known Allergies Prior to Admission medications   Medication Sig Start Date End Date Taking? Authorizing Provider  ASPIRIN  81 PO Take 1 tablet by mouth daily.   Yes [provider]  meloxicam  (MOBIC ) 15 MG tablet Take 15 mg by mouth daily.   Yes [provider]  omeprazole (PRILOSEC) 20 MG capsule Take 20 mg by mouth daily.   Yes [provider]  tamsulosin  (FLOMAX ) 0.4 MG CAPS capsule TAKE 1 CAPSULE(0.4 MG) BY MOUTH DAILY 02/01/24  Yes Levora Reyes SAUNDERS, MD  vitamin B-12 (CYANOCOBALAMIN) 1000 MCG tablet Take 1,000 mcg by mouth daily.   Yes [provider]   Social History   Socioeconomic History   Marital status: Married    Spouse name: Not on file   Number of children: Not on file   Years of education: Not on file   Highest education level: Not on file  Occupational History  Not on file  Tobacco Use   Smoking status: Never   Smokeless tobacco: Never  Vaping Use   Vaping status: Never Used  Substance and Sexual Activity   Alcohol use: No    Alcohol/week: 0.0 standard drinks of alcohol   Drug use: No   Sexual activity: Yes  Other Topics Concern   Not on file  Social History Narrative   Not on file   Social Drivers of Health   Financial Resource Strain: Low Risk  (03/17/2021)   Overall Financial Resource Strain (CARDIA)    Difficulty of Paying Living Expenses: Not hard at all  Food Insecurity: No Food Insecurity (03/17/2021)   Hunger Vital Sign    Worried About Running Out of  Food in the Last Year: Never true    Ran Out of Food in the Last Year: Never true  Transportation Needs: No Transportation Needs (03/17/2021)   PRAPARE - Administrator, Civil Service (Medical): No    Lack of Transportation (Non-Medical): No  Physical Activity: Inactive (03/17/2021)   Exercise Vital Sign    Days of Exercise per Week: 0 days    Minutes of Exercise per Session: 0 min  Stress: No Stress Concern Present (03/17/2021)   [employer] of Occupational Health - Occupational Stress Questionnaire    Feeling of Stress : Not at all  Social Connections: Moderately Isolated (03/17/2021)   Social Connection and Isolation Panel    Frequency of Communication with Friends and Family: More than three times a week    Frequency of Social Gatherings with Friends and Family: More than three times a week    Attends Religious Services: Never    Database administrator or Organizations: No    Attends Banker Meetings: Never    Marital Status: Married  Catering manager Violence: Not At Risk (03/17/2021)   Humiliation, Afraid, Rape, and Kick questionnaire    Fear of Current or Ex-Partner: No    Emotionally Abused: No    Physically Abused: No    Sexually Abused: No    Review of Systems Per HPI   Objective:   Vitals:   02/28/24 1048  BP: 102/68  Pulse: 69  Resp: 12  Temp: 98.4 F (36.9 C)  TempSrc: Temporal  SpO2: 95%  Weight: 161 lb 6.4 oz (73.2 kg)  Height: 5' 7 (1.702 m)     Physical Exam Vitals reviewed.  Constitutional:      Appearance: He is well-developed.  HENT:     Head: Normocephalic and atraumatic.  Neck:     Vascular: No carotid bruit or JVD.  Cardiovascular:     Rate and Rhythm: Normal rate and regular rhythm.     Heart sounds: Normal heart sounds. No murmur heard. Pulmonary:     Effort: Pulmonary effort is normal.     Breath sounds: Normal breath sounds. No rales.  Abdominal:     General: Abdomen is flat. There is no  distension.     Palpations: Abdomen is soft.     Tenderness: There is no abdominal tenderness.  Musculoskeletal:     Right lower leg: No edema.     Left lower leg: No edema.  Skin:    General: Skin is warm and dry.  Neurological:     Mental Status: He is alert and oriented to person, place, and time.  Psychiatric:        Mood and Affect: Mood normal.     Assessment & Plan:  Evan Lopez is a 83 y.o. male . LLQ abdominal pain  - Improved as above.  Bowel movements have normalized.  Using fiber supplement.  Reassuring CBC last visit and a centimeter currently.  Continue to monitor with ER/RTC precautions.  If worsening pain consider CT imaging.  Left temporal headache Fatigue, unspecified type Malaise  - Symptoms have improved.  Question volume depletion versus viral illness.  Appears to be back to baseline, RTC precautions if recurrence of symptoms.  64-month follow-up for physical.  No orders of the defined types were placed in this encounter.  Patient Instructions  Glad to hear that you are feeling better since last visit.  If any return of symptoms, lets recheck him talk about further workup if needed.  If acute worsening symptoms be seen right away either here, urgent care or ER but I do not expect that to happen.  If any worsening abdominal pain, let me know and we can look into a CT scan or other testing but most recent labs were reassuring and glad to hear that is also improving.  Take care!    Signed,   Reyes Pines, MD [facility], [facility] 02/28/24 11:24 AM

## 2024-02-28 NOTE — Patient Instructions (Signed)
 Glad to hear that you are feeling better since last visit.  If any return of symptoms, lets recheck him talk about further workup if needed.  If acute worsening symptoms be seen right away either here, urgent care or ER but I do not expect that to happen.  If any worsening abdominal pain, let me know and we can look into a CT scan or other testing but most recent labs were reassuring and glad to hear that is also improving.  Take care!

## 2024-03-20 DIAGNOSIS — M19012 Primary osteoarthritis, left shoulder: Secondary | ICD-10-CM | POA: Insufficient documentation

## 2024-04-11 DIAGNOSIS — M79641 Pain in right hand: Secondary | ICD-10-CM | POA: Insufficient documentation

## 2024-05-31 ENCOUNTER — Other Ambulatory Visit: Payer: Self-pay | Admitting: Family Medicine

## 2024-05-31 DIAGNOSIS — R350 Frequency of micturition: Secondary | ICD-10-CM

## 2024-06-06 ENCOUNTER — Ambulatory Visit: Payer: Self-pay

## 2024-06-06 NOTE — Telephone Encounter (Signed)
 Based on triage note the symptoms have been going on for a while.  If they are truly unchanged but just persistent, okay to wait until Thursday to see me.  If any acute worsening or chest pain, should be evaluated through ER.  Otherwise I am happy to see him Thursday to evaluate and come up with a plan.

## 2024-06-06 NOTE — Telephone Encounter (Signed)
 FYI Only or Action Required?: FYI only for provider: appointment scheduled on thursday.  Patient was last seen in primary care on 02/28/2024 by Levora Reyes SAUNDERS, MD.  Called Nurse Triage reporting Shortness of Breath. - mild SOB with exertion  Symptoms began several months ago.  Interventions attempted: Nothing.  Symptoms are: unchanged.  Triage Disposition: See PCP Within 2 Weeks  Patient/caregiver understands and will follow disposition?: Yes                        Copied from CRM [id number]. Topic: Clinical - Red Word Triage >> Jun 06, 2024 11:43 AM Eva FALCON wrote: Red Word that prompted transfer to Nurse Triage: experiencing some shortness of breath. Has been going on for a while but is getting concerned. Especially when he's doing yard work gets worse and that is not normal for him. Reason for Disposition  [1] MILD longstanding difficulty breathing (e.g., minimal/no SOB at rest, SOB with walking, pulse < 100) AND [2] SAME as normal  Answer Assessment - Initial Assessment Questions 1. RESPIRATORY STATUS: Describe your breathing? (e.g., wheezing, shortness of breath, unable to speak, severe coughing)      Mild SOB with exertion 2. ONSET: When did this breathing problem begin?      months 3. PATTERN Does the difficult breathing come and go, or has it been constant since it started?      Comes and goes 4. SEVERITY: How bad is your breathing? (e.g., mild, moderate, severe)      mild 5. RECURRENT SYMPTOM: Have you had difficulty breathing before? If Yes, ask: When was the last time? and What happened that time?      no 6. CARDIAC HISTORY: Do you have any history of heart disease? (e.g., heart attack, angina, bypass surgery, angioplasty)      no 7. LUNG HISTORY: Do you have any history of lung disease?  (e.g., pulmonary embolus, asthma, emphysema)     no 8. CAUSE: What do you think is causing the breathing problem?      unsure 9. OTHER  SYMPTOMS: Do you have any other symptoms? (e.g., chest pain, cough, dizziness, fever, runny nose)     no  Protocols used: Breathing Difficulty-A-AH

## 2024-06-06 NOTE — Telephone Encounter (Signed)
 Patient apt scheduled for thursday  experiencing some shortness of breath. Has been going on for a while but is getting concerned. Especially when he's doing yard work gets worse and that is not normal for him.   I can see if another provider can see him sooner if you feel he needs to be

## 2024-06-08 ENCOUNTER — Ambulatory Visit (INDEPENDENT_AMBULATORY_CARE_PROVIDER_SITE_OTHER): Admitting: Family Medicine

## 2024-06-08 ENCOUNTER — Ambulatory Visit
Admission: RE | Admit: 2024-06-08 | Discharge: 2024-06-08 | Disposition: A | Source: Ambulatory Visit | Attending: Family Medicine | Admitting: Family Medicine

## 2024-06-08 ENCOUNTER — Encounter: Payer: Self-pay | Admitting: Family Medicine

## 2024-06-08 VITALS — BP 112/66 | HR 71 | Temp 98.0°F | Resp 16 | Ht 67.0 in | Wt 165.6 lb

## 2024-06-08 DIAGNOSIS — L719 Rosacea, unspecified: Secondary | ICD-10-CM | POA: Insufficient documentation

## 2024-06-08 DIAGNOSIS — E785 Hyperlipidemia, unspecified: Secondary | ICD-10-CM

## 2024-06-08 DIAGNOSIS — L570 Actinic keratosis: Secondary | ICD-10-CM | POA: Insufficient documentation

## 2024-06-08 DIAGNOSIS — R0609 Other forms of dyspnea: Secondary | ICD-10-CM

## 2024-06-08 LAB — COMPREHENSIVE METABOLIC PANEL WITH GFR
ALT: 15 U/L (ref 3–53)
AST: 18 U/L (ref 5–37)
Albumin: 4.4 g/dL (ref 3.5–5.2)
Alkaline Phosphatase: 89 U/L (ref 39–117)
BUN: 14 mg/dL (ref 6–23)
CO2: 27 meq/L (ref 19–32)
Calcium: 9.1 mg/dL (ref 8.4–10.5)
Chloride: 105 meq/L (ref 96–112)
Creatinine, Ser: 1.06 mg/dL (ref 0.40–1.50)
GFR: 65.67 mL/min
Glucose, Bld: 85 mg/dL (ref 70–99)
Potassium: 3.7 meq/L (ref 3.5–5.1)
Sodium: 140 meq/L (ref 135–145)
Total Bilirubin: 0.9 mg/dL (ref 0.2–1.2)
Total Protein: 7 g/dL (ref 6.0–8.3)

## 2024-06-08 LAB — LIPID PANEL
Cholesterol: 228 mg/dL — ABNORMAL HIGH (ref 28–200)
HDL: 58.8 mg/dL
LDL Cholesterol: 147 mg/dL — ABNORMAL HIGH (ref 10–99)
NonHDL: 169.21
Total CHOL/HDL Ratio: 4
Triglycerides: 110 mg/dL (ref 10.0–149.0)
VLDL: 22 mg/dL (ref 0.0–40.0)

## 2024-06-08 LAB — CBC
HCT: 41.5 % (ref 39.0–52.0)
Hemoglobin: 14.5 g/dL (ref 13.0–17.0)
MCHC: 34.9 g/dL (ref 30.0–36.0)
MCV: 86.5 fl (ref 78.0–100.0)
Platelets: 211 K/uL (ref 150.0–400.0)
RBC: 4.8 Mil/uL (ref 4.22–5.81)
RDW: 13.3 % (ref 11.5–15.5)
WBC: 7.1 K/uL (ref 4.0–10.5)

## 2024-06-08 NOTE — Progress Notes (Signed)
 "  Subjective:  Patient ID: Evan Lopez, male    DOB: 05-24-1941  Age: 84 y.o. MRN: [id number]  CC:  Chief Complaint  Patient presents with   Acute Visit    SOB. Sx started 1-1.5 months ago. Son complained that he was breathing hard. Regular yard work makes him SOB.     HPI [name] presents for  Acute visit for dyspnea.  Dyspnea Noted for the past 1 to 1-1/2 months.  Breathing harder with activity.  Noticed more shortness of breath with regular yard work. Getting more winded than usual. No chest pain or tightness. Improves with rest.  Some shoulder pain with pressing on muscles only.  Minimal increase in symptoms past few weeks  No melena, hematochezia.  No cough. No wheezing.  No recent fever or illness.  No recent prolonged car travel or air travel, no recent calf pain or swelling. No hx of dvt.  No leg swelling.  No symptoms at rest.     He does have a history of hyperlipidemia, aortic atherosclerosis, on aspirin  81 mg daily.  No recent cardiac testing, had myocardial imaging back in 2014, and left heart cath without significant CAD.   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 Authorizing Provider  ASPIRIN  81 PO Take 1 tablet by mouth daily.   Yes [provider]  meloxicam  (MOBIC ) 15 MG tablet Take 15 mg by mouth daily.   Yes [provider]  omeprazole (PRILOSEC) 20 MG capsule Take 20 mg by mouth daily.   Yes [provider]  tamsulosin  (FLOMAX ) 0.4 MG CAPS capsule TAKE 1 CAPSULE(0.4 MG) BY MOUTH DAILY 05/31/24  Yes Levora Reyes SAUNDERS, MD  vitamin B-12 (CYANOCOBALAMIN) 1000 MCG tablet Take 1,000 mcg by mouth daily.   Yes [provider]   Social History   Socioeconomic History   Marital status: Married    Spouse name: Not on file   Number of children: Not on file   Years of education: Not on file   Highest education level: Not on file  Occupational History   Not on file  Tobacco Use   Smoking status: Never   Smokeless tobacco: Never  Vaping Use   Vaping status: Never Used  Substance  and Sexual Activity   Alcohol use: No    Alcohol/week: 0.0 standard drinks of alcohol   Drug use: No   Sexual activity: Yes  Other Topics Concern   Not on file  Social History Narrative   Not on file   Social Drivers of Health   Tobacco Use: Low Risk (06/08/2024)   Patient History    Smoking Tobacco Use: Never    Smokeless Tobacco Use: Never    Passive Exposure: Not on file  Financial Resource Strain: Not on file  Food Insecurity: Not on file  Transportation Needs: Not on file  Physical Activity: Not on file  Stress: Not on file  Social Connections: Not on file  Intimate Partner Violence: Not on file  Depression (PHQ2-9): Low Risk (02/27/2022)   Depression (PHQ2-9)    PHQ-2  Score: 0  Alcohol Screen: Not on file  Housing: Not on file  Utilities: Not on file  Health Literacy: Not on file    Review of Systems Per HPI  Objective:   Vitals:   06/08/24 1304  BP: 112/66  Pulse: 71  Resp: 16  Temp: 98 F (36.7 C)  TempSrc: Temporal  SpO2: 97%  Weight: 165 lb 9.6 oz (75.1 kg)  Height: 5' 7 (1.702 m)    Physical Exam Vitals reviewed.  Constitutional:      Appearance: He is well-developed.  HENT:     Head: Normocephalic and atraumatic.  Neck:     Vascular: No carotid bruit or JVD.  Cardiovascular:     Rate and Rhythm: Normal rate and regular rhythm.     Heart sounds: Normal heart sounds. No murmur heard. Pulmonary:     Effort: Pulmonary effort is normal.     Breath sounds: Normal breath sounds. No rales.  Musculoskeletal:     Right lower leg: No edema.     Left lower leg: No edema.     Comments: Calves nontender, negative Homans, no edema  Skin:    General: Skin is warm and dry.  Neurological:     Mental Status: He is alert and oriented to person, place, and time.  Psychiatric:        Mood and Affect: Mood normal.    EKG, sinus rhythm with rate 65, PR 184, QTc 428. Compared to 02/21/2024, no apparent significant changes or acute ST/T wave changes.   Assessment & Plan:  [name] is a 84 y.o. male . DOE (dyspnea on exertion) - Plan: CBC, EKG 12-Lead, Comprehensive metabolic panel with GFR, Lipid panel, DG Chest 2 View, Ambulatory referral to Cardiology  Hyperlipidemia, unspecified hyperlipidemia type - Plan: Comprehensive metabolic panel with GFR, Lipid panel, Ambulatory referral to Cardiology  Subacute dyspnea on exertion as above.  No chest pain, no apparent EKG changes.  Check chest x-ray, and will refer to cardiology to rule out cardiac source.  Will check labs to rule out electrolyte or anemia cause.  ER precautions given, activity modification discussed for now, avoid exertional activity.  No orders of the defined types  were placed in this encounter.  Patient Instructions  Thank you for coming in today. No change in medications at this time, but I will check some labs, please have x-ray at the [facility] location below, and I will be referring you to cardiology to discuss the symptoms to see if they recommend additional testing.  For now I recommend relative rest, avoid any exertion or activities that have caused shortness of breath previously until we evaluate the  symptoms further.  If you have any chest pain or tightness, or any acute worsening of symptoms, be seen through the emergency room.  [facility] or xray: Walk in 8:30-4:30 during weekdays, no appointment needed [address]  [place], [place] [address]  Shortness of Breath, Adult Shortness of breath is when a person has trouble breathing or when a person feels like she or he is having trouble breathing in enough air. Shortness of breath could be a sign of a medical problem. Follow these instructions at home:  Pollutants Do not use any products that contain nicotine or tobacco. These products include cigarettes, chewing tobacco, and vaping devices, such as e-cigarettes. This also includes cigars and pipes. If you need help quitting, ask your health care provider. Avoid things that can irritate your airways, including: Smoke. This includes campfire smoke, forest fire smoke, and secondhand smoke from tobacco products. Do not smoke or allow others to smoke in your home. Mold. Dust. Air pollution. Chemical fumes. Things that can give you an allergic reaction (allergens) if you have allergies. Common allergens include pollen from grasses or trees and animal dander. Keep your living space clean and free of mold and dust. General instructions Pay attention to any changes in your symptoms. Take over-the-counter and prescription medicines only as told by your health care provider. This includes oxygen therapy and inhaled medicines. Rest as  needed. Return to your normal activities as told by your health care provider. Ask your health care provider what activities are safe for you. Keep all follow-up visits. This is important. Contact a health care provider if: Your condition does not improve as soon as expected. You have a hard time doing your normal activities, even after you rest. You have new symptoms. You cannot walk up stairs or exercise the way that you normally do. Get help right away if: Your shortness of breath gets worse. You have shortness of breath when you are resting. You feel light-headed or you faint. You have a cough that is not controlled with medicines. You cough up blood. You have pain with breathing. You have pain in your chest, arms, shoulders, or abdomen. You have a fever. These symptoms may be an emergency. Get help right away. Call 911. Do not wait to see if the symptoms will go away. Do not drive yourself to the hospital. Summary Shortness of breath is when a person has trouble breathing enough air. It can be a sign of a medical problem. Avoid things that irritate your lungs, such as smoking, pollution, mold, and dust. Pay attention to changes in your symptoms and contact your health care provider if you have a hard time completing daily activities because of shortness of breath. This information is not intended to replace advice given to you by your health care provider. Make sure you discuss any questions you have with your health care provider. Document Revised: 12/28/2020 Document Reviewed: 12/28/2020 Elsevier Patient Education  2024 Elsevier Inc.    Signed,   Reyes Pines, MD [facility], [facility] 06/08/24 1:23 PM      [1] No Known Allergies  "

## 2024-06-08 NOTE — Patient Instructions (Addendum)
 Thank you for coming in today. No change in medications at this time, but I will check some labs, please have x-ray at the [facility] location below, and I will be referring you to cardiology to discuss the symptoms to see if they recommend additional testing.  For now I recommend relative rest, avoid any exertion or activities that have caused shortness of breath previously until we evaluate the symptoms further.  If you have any chest pain or tightness, or any acute worsening of symptoms, be seen through the emergency room.  I do not expect that to occur.  [facility] or xray: Walk in 8:30-4:30 during weekdays, no appointment needed [address]  [place], [place] [address]  Shortness of Breath, Adult Shortness of breath is when a person has trouble breathing or when a person feels like she or he is having trouble breathing in enough air. Shortness of breath could be a sign of a medical problem. Follow these instructions at home:  Pollutants Do not use any products that contain nicotine or tobacco. These products include cigarettes, chewing tobacco, and vaping devices, such as e-cigarettes. This also includes cigars and pipes. If you need help quitting, ask your health care provider. Avoid things that can irritate your airways, including: Smoke. This includes campfire smoke, forest fire smoke, and secondhand smoke from tobacco products. Do not smoke or allow others to smoke in your home. Mold. Dust. Air pollution. Chemical fumes. Things that can give you an allergic reaction (allergens) if you have allergies. Common allergens include pollen from grasses or trees and animal dander. Keep your living space clean and free of mold and dust. General instructions Pay attention to any changes in your symptoms. Take over-the-counter and prescription medicines only as told by your health care provider. This includes oxygen therapy and inhaled medicines. Rest as needed. Return to your normal  activities as told by your health care provider. Ask your health care provider what activities are safe for you. Keep all follow-up visits. This is important. Contact a health care provider if: Your condition does not improve as soon as expected. You have a hard time doing your normal activities, even after you rest. You have new symptoms. You cannot walk up stairs or exercise the way that you normally do. Get help right away if: Your shortness of breath gets worse. You have shortness of breath when you are resting. You feel light-headed or you faint. You have a cough that is not controlled with medicines. You cough up blood. You have pain with breathing. You have pain in your chest, arms, shoulders, or abdomen. You have a fever. These symptoms may be an emergency. Get help right away. Call 911. Do not wait to see if the symptoms will go away. Do not drive yourself to the hospital. Summary Shortness of breath is when a person has trouble breathing enough air. It can be a sign of a medical problem. Avoid things that irritate your lungs, such as smoking, pollution, mold, and dust. Pay attention to changes in your symptoms and contact your health care provider if you have a hard time completing daily activities because of shortness of breath. This information is not intended to replace advice given to you by your health care provider. Make sure you discuss any questions you have with your health care provider. Document Revised: 12/28/2020 Document Reviewed: 12/28/2020 Elsevier Patient Education  2024 [employer].

## 2024-06-10 ENCOUNTER — Ambulatory Visit: Payer: Self-pay | Admitting: Family Medicine

## 2024-06-15 NOTE — Progress Notes (Unsigned)
 CARDIOLOGY CONSULT NOTE       Patient ID: Evan Lopez MRN: [id number] DOB/AGE: Nov 21, 1940 84 y.o.  Referring Physician: Levora Primary Physician: Evan Lopez Reyes SAUNDERS, MD Primary Cardiologist: Evan Lopez: Dyspnea/HLD   HPI:  84 y.o. referred by Evan Lopez for exertional dyspnea. History of GERD, HLD and renal stones. He is a non smoker. Seen by primary 06/08/24. Complained of dyspnea for about 2 months. Evan Lopez makes him SOB. No chest pain. No history of DVT, leg swelling or PE. Had hard cath in 2014 that was normal CXR done 06/08/24 NAD.   Labs with Hct 41.5 normal ESR. LDL 147 HDL 58.8  not on Rx   He is widowed from 2nd wife of 48 years She had two kids he helped raise and they had one of their own [place] who is a homicide archivist in [place]. Retired from [employer] Wife passed March 2025  ROS All other systems reviewed and negative except as noted above  Past Medical History:  Diagnosis Date   DIVERTICULOSIS OF COLON    GERD (gastroesophageal reflux disease)    Hyperlipidemia    Kidney calculi    Nonspecific abnormal finding in stool contents     Family History  Problem Relation Age of Onset   Pancreatic cancer Mother    Alcohol abuse Father    Breast cancer Sister     Social History   Socioeconomic History   Marital status: Married    Spouse name: Not on file   Number of children: Not on file   Years of education: Not on file   Highest education level: Not on file  Occupational History   Not on file  Tobacco Use   Smoking status: Never   Smokeless tobacco: Never  Vaping Use   Vaping status: Never Used  Substance and Sexual Activity   Alcohol use: No    Alcohol/week: 0.0 standard drinks of alcohol   Drug use: No   Sexual activity: Yes  Other Topics Concern   Not on file  Social History Narrative   Not on file   Social Drivers of Health   Tobacco Use: Low Risk (06/16/2024)   Patient History    Smoking Tobacco Use: Never     Smokeless Tobacco Use: Never    Passive Exposure: Not on file  Financial Resource Strain: Not on file  Food Insecurity: Not on file  Transportation Needs: Not on file  Physical Activity: Not on file  Stress: Not on file  Social Connections: Not on file  Intimate Partner Violence: Not on file  Depression (PHQ2-9): Low Risk (02/27/2022)   Depression (PHQ2-9)    PHQ-2 Score: 0  Alcohol Screen: Not on file  Housing: Not on file  Utilities: Not on file  Health Literacy: Not on file    Past Surgical History:  Procedure Laterality Date   APPENDECTOMY  1964   HEMORRHOID SURGERY  1984     Current Medications[1]   sodium chloride       Physical Exam: Blood pressure (!) 144/86, pulse 75, height 5' 7 (1.702 m), weight 76.2 kg, SpO2 98%.    Affect appropriate Healthy:  appears stated age HEENT: normal Neck supple with no adenopathy JVP normal no bruits no thyromegaly Lungs clear with no wheezing and good diaphragmatic motion Heart:  S1/S2 no murmur, no rub, gallop or click PMI normal Abdomen: benighn, BS positve, no tenderness, no AAA no bruit.  No HSM or HJR Distal pulses intact with no bruits  No edema Neuro non-focal Skin warm and dry No muscular weakness  Labs:   Lab Results  Component Value Date   WBC 7.1 06/08/2024   HGB 14.5 06/08/2024   HCT 41.5 06/08/2024   MCV 86.5 06/08/2024   PLT 211.0 06/08/2024    No results for input(s): NA, K, CL, CO2, BUN, CREATININE, CALCIUM , PROT, BILITOT, ALKPHOS, ALT, AST, GLUCOSE in the last 168 hours.  Invalid input(s): LABALBU  No results found for: CKTOTAL, CKMB, CKMBINDEX, TROPONINI  Lab Results  Component Value Date   CHOL 228 (H) 06/08/2024   CHOL 232 (H) 10/07/2020   CHOL 208 (H) 01/07/2017   Lab Results  Component Value Date   HDL 58.80 06/08/2024   HDL 59.40 10/07/2020   HDL 56 01/07/2017   Lab Results  Component Value Date   LDLCALC 147 (H) 06/08/2024   LDLCALC 144  (H) 10/07/2020   LDLCALC 137 (H) 01/07/2017   Lab Results  Component Value Date   TRIG 110.0 06/08/2024   TRIG 145.0 10/07/2020   TRIG 74 01/07/2017   Lab Results  Component Value Date   CHOLHDL 4 06/08/2024   CHOLHDL 4 10/07/2020   CHOLHDL 3.7 01/07/2017   No results found for: LDLDIRECT    Radiology: DG Chest 2 View Result Date: 06/08/2024 CLINICAL DATA:  Dyspnea on exertion. EXAM: CHEST - 2 VIEW COMPARISON:  Chest radiograph dated 05/30/2020. FINDINGS: No focal consolidation, pleural effusion, or pneumothorax. There is diffuse chronic interstitial coarsening. The cardiac silhouette is within normal limits. No acute osseous pathology. IMPRESSION: No active cardiopulmonary disease. Electronically Signed   By: Evan Lopez M.D.   On: 06/08/2024 14:19    EKG: 06/08/24 SR rate 65 normal slightly poor R wave progression    ASSESSMENT AND PLAN:   Dyspnea:  CXR NAD. Non smoker. Exam no murmurs. No signs of DVT/PE  Update labs TSH, BNP and d dimer. Echo to assess EF CAD Risk:  with age and unRx HLD.  Consider dyspnea as possible anginal equivalent Shared decision making favor PET /CT to further risk stratify  HLD:  LDL 147 no documented vascular dx. Will see what CT portion of PET shows in regard to coronary calcium  Consider starting statin   Echo TSH, BNP D dimer PET/CT   F/U PRN pending tests   Signed: Maude Lopez 06/16/2024, 10:08 AM      [1]  Current Outpatient Medications:    ASPIRIN  81 PO, Take 1 tablet by mouth daily., Disp: , Rfl:    meloxicam  (MOBIC ) 15 MG tablet, Take 15 mg by mouth daily., Disp: , Rfl:    omeprazole (PRILOSEC) 20 MG capsule, Take 20 mg by mouth daily., Disp: , Rfl:    tamsulosin  (FLOMAX ) 0.4 MG CAPS capsule, TAKE 1 CAPSULE(0.4 MG) BY MOUTH DAILY, Disp: 90 capsule, Rfl: 0   vitamin B-12 (CYANOCOBALAMIN) 1000 MCG tablet, Take 1,000 mcg by mouth daily., Disp: , Rfl:   Current Facility-Administered Medications:    0.9 %  sodium chloride   infusion, 500 mL, Intravenous, Continuous, Evan Lopez, Evan Lopez., MD

## 2024-06-16 ENCOUNTER — Ambulatory Visit: Attending: Cardiovascular Disease | Admitting: Cardiovascular Disease

## 2024-06-16 ENCOUNTER — Ambulatory Visit: Payer: Self-pay | Admitting: Cardiovascular Disease

## 2024-06-16 ENCOUNTER — Encounter: Payer: Self-pay | Admitting: Cardiovascular Disease

## 2024-06-16 VITALS — BP 144/86 | HR 75 | Ht 67.0 in | Wt 168.0 lb

## 2024-06-16 DIAGNOSIS — E782 Mixed hyperlipidemia: Secondary | ICD-10-CM | POA: Diagnosis not present

## 2024-06-16 DIAGNOSIS — R0609 Other forms of dyspnea: Secondary | ICD-10-CM

## 2024-06-16 NOTE — Patient Instructions (Addendum)
 Medication Instructions:  Your physician recommends that you continue on your current medications as directed. Please refer to the Current Medication list given to you today.  *If you need a refill on your cardiac medications before your next appointment, please call your pharmacy*  Lab Work: TSH, BNP, D-Dimer today  Testing/Procedures: Your physician has requested that you have an echocardiogram. Echocardiography is a painless test that uses sound waves to create images of your heart. It provides your doctor with information about the size and shape of your heart and how well your hearts chambers and valves are working. This procedure takes approximately one hour. There are no restrictions for this procedure. Please do NOT wear cologne, perfume, aftershave, or lotions (deodorant is allowed). Please arrive 15 minutes prior to your appointment time.  Please note: We ask at that you not bring children with you during ultrasound (echo/ vascular) testing. Due to room size and safety concerns, children are not allowed in the ultrasound rooms during exams. Our front office staff cannot provide observation of children in our lobby area while testing is being conducted. An adult accompanying a patient to their appointment will only be allowed in the ultrasound room at the discretion of the ultrasound technician under special circumstances. We apologize for any inconvenience.     Please report to Radiology at the [facility] Main Entrance 30 minutes early for your test.  [address] [place], [place] [address]     How to Prepare for Your Cardiac PET/CT Stress Test:  Nothing to eat or drink, except water, 3 hours prior to arrival time.  NO caffeine/decaffeinated products, or chocolate 12 hours prior to arrival. (Please note decaffeinated beverages (teas/coffees) still contain caffeine).  If you have caffeine within 12 hours prior, the test will need to be rescheduled.  Medication  instructions: Do not take erectile dysfunction medications for 72 hours prior to test (sildenafil, tadalafil) Do not take nitrates (isosorbide mononitrate, Ranexa) the day before or day of test Do not take tamsulosin  the day before or morning of test Hold theophylline containing medications for 12 hours. Hold Dipyridamole 48 hours prior to the test.  Diabetic Preparation: If able to eat breakfast prior to 3 hour fasting, you may take all medications, including your insulin. Do not worry if you miss your breakfast dose of insulin - start at your next meal. If you do not eat prior to 3 hour fast-Hold all diabetes (oral and insulin) medications. Patients who wear a continuous glucose monitor MUST remove the device prior to scanning.  You may take your remaining medications with water.  NO perfume, cologne or lotion on chest or abdomen area. FEMALES - Please avoid wearing dresses to this appointment.  Total time is 1 to 2 hours; you may want to bring reading material for the waiting time.  IF YOU THINK YOU MAY BE PREGNANT, OR ARE NURSING PLEASE INFORM THE TECHNOLOGIST.  In preparation for your appointment, medication and supplies will be purchased.  Appointment availability is limited, so if you need to cancel or reschedule, please call the Radiology Department Scheduler at [phone] 24 hours in advance to avoid a cancellation fee of $100.00  What to Expect When you Arrive:  Once you arrive and check in for your appointment, you will be taken to a preparation room within the Radiology Department.  A technologist or Nurse will obtain your medical history, verify that you are correctly prepped for the exam, and explain the procedure.  Afterwards, an IV  will be started in your arm and electrodes will be placed on your skin for EKG monitoring during the stress portion of the exam. Then you will be escorted to the PET/CT scanner.  There, staff will get you positioned on the scanner and obtain a  blood pressure and EKG.  During the exam, you will continue to be connected to the EKG and blood pressure machines.  A small, safe amount of a radioactive tracer will be injected in your IV to obtain a series of pictures of your heart along with an injection of a stress agent.    After your Exam:  It is recommended that you eat a meal and drink a caffeinated beverage to counter act any effects of the stress agent.  Drink plenty of fluids for the remainder of the day and urinate frequently for the first couple of hours after the exam.  Your doctor will inform you of your test results within 7-10 business days.  For more information and frequently asked questions, please visit our website: [email or web address]  For questions about your test or how to prepare for your test, please call: Cardiac Imaging Nurse Navigators Office: [phone]   Follow-Up: At [facility], you and your health needs are our priority.  As part of our continuing mission to provide you with exceptional heart care, our providers are all part of one team.  This team includes your primary Cardiologist (physician) and Advanced Practice Providers or APPs (Physician Assistants and Nurse Practitioners) who all work together to provide you with the care you need, when you need it.  Your next appointment:    As needed per testing results   We recommend signing up for the patient portal called MyChart.  Sign up information is provided on this After Visit Summary.  MyChart is used to connect with patients for Virtual Visits (Telemedicine).  Patients are able to view lab/test results, encounter notes, upcoming appointments, etc.  Non-urgent messages can be sent to your provider as well.   To learn more about what you can do with MyChart, go to [email or web address].   Other Instructions

## 2024-06-19 LAB — TSH: TSH: 2.73 u[IU]/mL (ref 0.450–4.500)

## 2024-06-19 LAB — BRAIN NATRIURETIC PEPTIDE: BNP: 52.5 pg/mL (ref 0.0–100.0)

## 2024-06-19 LAB — SPECIMEN STATUS REPORT

## 2024-06-19 LAB — D-DIMER, QUANTITATIVE

## 2024-06-21 ENCOUNTER — Ambulatory Visit: Admitting: Cardiovascular Disease

## 2024-07-19 ENCOUNTER — Other Ambulatory Visit (HOSPITAL_COMMUNITY)

## 2024-07-19 ENCOUNTER — Ambulatory Visit (HOSPITAL_COMMUNITY)

## 2024-09-01 ENCOUNTER — Encounter: Admitting: Family Medicine
